# Patient Record
Sex: Female | Born: 1990 | Race: Black or African American | Hispanic: No | Marital: Single | Smoking: Former smoker
Health system: Southern US, Community
[De-identification: ages and names within clinical notes are randomized; demographics above are authoritative.]

## PROBLEM LIST (undated history)

## (undated) ENCOUNTER — Inpatient Hospital Stay (HOSPITAL_COMMUNITY): Payer: Self-pay

## (undated) DIAGNOSIS — M859 Disorder of bone density and structure, unspecified: Secondary | ICD-10-CM

## (undated) DIAGNOSIS — Z789 Other specified health status: Secondary | ICD-10-CM

## (undated) HISTORY — PX: NO PAST SURGERIES: SHX2092

---
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
## 2014-11-24 ENCOUNTER — Encounter (HOSPITAL_COMMUNITY): Payer: Self-pay | Admitting: *Deleted

## 2014-11-24 ENCOUNTER — Emergency Department (HOSPITAL_COMMUNITY)
Admission: EM | Admit: 2014-11-24 | Discharge: 2014-11-24 | Disposition: A | Discharge status: Home / Self Care | Payer: Self-pay | Attending: Emergency Medicine | Admitting: Emergency Medicine

## 2014-11-24 DIAGNOSIS — Z880 Allergy status to penicillin: Secondary | ICD-10-CM | POA: Insufficient documentation

## 2014-11-24 DIAGNOSIS — R1010 Upper abdominal pain, unspecified: Secondary | ICD-10-CM

## 2014-11-24 DIAGNOSIS — Z720 Tobacco use: Secondary | ICD-10-CM | POA: Insufficient documentation

## 2014-11-24 DIAGNOSIS — R112 Nausea with vomiting, unspecified: Secondary | ICD-10-CM | POA: Insufficient documentation

## 2014-11-24 LAB — COMPREHENSIVE METABOLIC PANEL
ALBUMIN: 3.8 g/dL (ref 3.5–5.0)
ALK PHOS: 45 U/L (ref 38–126)
ALT: 15 U/L (ref 14–54)
AST: 18 U/L (ref 15–41)
Anion gap: 5 (ref 5–15)
BUN: 10 mg/dL (ref 6–20)
CALCIUM: 9.3 mg/dL (ref 8.9–10.3)
CHLORIDE: 106 mmol/L (ref 101–111)
CO2: 26 mmol/L (ref 22–32)
CREATININE: 0.63 mg/dL (ref 0.44–1.00)
GFR calc non Af Amer: >60 mL/min (ref >60)
GLUCOSE: 92 mg/dL (ref 65–99)
Potassium: 4 mmol/L (ref 3.5–5.1)
SODIUM: 137 mmol/L (ref 135–145)
Total Bilirubin: 0.5 mg/dL (ref 0.3–1.2)
Total Protein: 7.7 g/dL (ref 6.5–8.1)

## 2014-11-24 LAB — CBC
HCT: 34.1 % — ABNORMAL LOW (ref 36.0–46.0)
Hemoglobin: 11.2 g/dL — ABNORMAL LOW (ref 12.0–15.0)
MCH: 22.3 pg — AB (ref 26.0–34.0)
MCHC: 32.8 g/dL (ref 30.0–36.0)
MCV: 67.8 fL — AB (ref 78.0–100.0)
PLATELETS: 274 10*3/uL (ref 150–400)
RBC: 5.03 MIL/uL (ref 3.87–5.11)
RDW: 14.9 % (ref 11.5–15.5)
WBC: 5.3 10*3/uL (ref 4.0–10.5)

## 2014-11-24 LAB — URINALYSIS, ROUTINE W REFLEX MICROSCOPIC
Bilirubin Urine: NEGATIVE
GLUCOSE, UA: NEGATIVE mg/dL
HGB URINE DIPSTICK: NEGATIVE
KETONES UR: NEGATIVE mg/dL
LEUKOCYTES UA: NEGATIVE
Nitrite: NEGATIVE
PH: 7.5 (ref 5.0–8.0)
Protein, ur: NEGATIVE mg/dL
Specific Gravity, Urine: 1.029 (ref 1.005–1.030)
Urobilinogen, UA: 1 mg/dL (ref 0.0–1.0)

## 2014-11-24 LAB — LIPASE, BLOOD: LIPASE: 18 U/L — AB (ref 22–51)

## 2014-11-24 LAB — HCG, QUANTITATIVE, PREGNANCY: hCG, Beta Chain, Quant, S: <1 m[IU]/mL (ref <5)

## 2014-11-24 MED ORDER — GI COCKTAIL ~~LOC~~
30.0000 mL | Freq: Once | ORAL | Status: AC
  Administered 2014-11-24: 30 mL via ORAL
  Filled 2014-11-24: qty 30

## 2014-11-24 MED ORDER — PANTOPRAZOLE SODIUM 40 MG PO TBEC
40.0000 mg | DELAYED_RELEASE_TABLET | Freq: Once | ORAL | Status: AC
  Administered 2014-11-24: 40 mg via ORAL
  Filled 2014-11-24: qty 1

## 2014-11-24 MED ORDER — PANTOPRAZOLE SODIUM 40 MG PO TBEC: 40.0000 mg | DELAYED_RELEASE_TABLET | Freq: Every day | ORAL | Status: DC

## 2014-11-24 NOTE — ED Notes (Signed)
Pt c/o lower abdominal and lower back pain for several weeks and emesis since yesterday.  Pt is not sure if she is pregnant.  Did not take pregnancy test.

## 2014-11-24 NOTE — ED Provider Notes (Signed)
CSN: [id number]     Arrival date & time 11/24/14  1336 History   First MD Initiated Contact with Patient 11/24/14 1503     Chief Complaint  Patient presents with  . Abdominal Pain  . Emesis     (Consider location/radiation/quality/duration/timing/severity/associated sxs/prior Treatment) HPI  Patient presents with concern of abdominal pain, back pain, nausea Symptoms began about 3 weeks ago, since onset have been intermittent, occurring without clear precipitant. Pain is typically supraumbilical, nonradiating, sore. Today the patient had one episode of vomiting as well. No ongoing fever, chills, substantial nausea, lower abdominal pain, diarrhea, urinary complaints. Patient states that she is generally well, takes no medication regularly.   History reviewed. No pertinent past medical history. History reviewed. No pertinent past surgical history. No family history on file. Social History  Substance Use Topics  . Smoking status: Current Some Day Smoker  . Smokeless tobacco: None  . Alcohol Use: Yes   OB History    No data available     Review of Systems  Constitutional:       Per HPI, otherwise negative  HENT:       Per HPI, otherwise negative  Respiratory:       Per HPI, otherwise negative  Cardiovascular:       Per HPI, otherwise negative  Gastrointestinal: Positive for nausea and vomiting.  Endocrine:       Negative aside from HPI  Genitourinary:       Neg aside from HPI   Musculoskeletal:       Per HPI, otherwise negative  Skin: Negative.   Neurological: Negative for syncope.      Allergies  Penicillins  Home Medications   Prior to Admission medications   Not on File   BP 112/68 mmHg  Pulse 78  Temp(Src) 98.4 F (36.9 C) (Oral)  Resp 18  Ht  (1.651 m)  Wt 162 lb (73.483 kg)  BMI 26.96 kg/m2  SpO2 100%  LMP 10/24/2014 Physical Exam  Constitutional: She is oriented to person, place, and time. She appears well-developed and well-nourished.  No distress.  HENT:  Head: Normocephalic and atraumatic.  Eyes: Conjunctivae and EOM are normal.  Cardiovascular: Normal rate and regular rhythm.   Pulmonary/Chest: Effort normal and breath sounds normal. No stridor. No respiratory distress.  Abdominal: She exhibits no distension. There is no hepatosplenomegaly. There is no tenderness. There is no rigidity, no guarding, no CVA tenderness, no tenderness at McBurney's point and negative Murphy's sign.  Musculoskeletal: She exhibits no edema.  Neurological: She is alert and oriented to person, place, and time. No cranial nerve deficit.  Skin: Skin is warm and dry.  Psychiatric: She has a normal mood and affect.  Nursing note and vitals reviewed.   ED Course  Procedures (including critical care time) Labs Review Labs Reviewed  LIPASE, BLOOD - Abnormal; Notable for the following:    Lipase 18 (*)    All other components within normal limits  CBC - Abnormal; Notable for the following:    Hemoglobin 11.2 (*)    HCT 34.1 (*)    MCV 67.8 (*)    MCH 22.3 (*)    All other components within normal limits  URINALYSIS, ROUTINE W REFLEX MICROSCOPIC (NOT AT [facility]) - Abnormal; Notable for the following:    APPearance HAZY (*)    All other components within normal limits  COMPREHENSIVE METABOLIC PANEL  HCG, QUANTITATIVE, PREGNANCY   On repeat exam the patient is in no distress. Labs  reassuring. We discussed therapy for gastritis, primary care/GI follow-up.  MDM  Well-appearing female presents with ongoing episodic abdominal pain. Here, no evidence for vascular compromise, infection, pregnancy. Patient is a soft, non-peritoneal, nontender abdomen. Patient was started on empiric therapy for presumed gastritis, discharged in stable condition with return precautions, follow-up instructions.  Gerhard Munch, MD 11/24/14 [phone]

## 2014-11-24 NOTE — Discharge Instructions (Signed)
As discussed, your evaluation today has been largely reassuring.  But, it is important that you monitor your condition carefully, and do not hesitate to return to the ED if you develop new, or concerning changes in your condition.  Please follow-up with our gastroenterologist physicians for appropriate ongoing care.   Abdominal Pain Many things can cause abdominal pain. Usually, abdominal pain is not caused by a disease and will improve without treatment. It can often be observed and treated at home. Your health care provider will do a physical exam and possibly order blood tests and X-rays to help determine the seriousness of your pain. However, in many cases, more time must pass before a clear cause of the pain can be found. Before that point, your health care provider may not know if you need more testing or further treatment. HOME CARE INSTRUCTIONS  Monitor your abdominal pain for any changes. The following actions may help to alleviate any discomfort you are experiencing:  Only take over-the-counter or prescription medicines as directed by your health care provider.  Do not take laxatives unless directed to do so by your health care provider.  Try a clear liquid diet (broth, tea, or water) as directed by your health care provider. Slowly move to a bland diet as tolerated. SEEK MEDICAL CARE IF:  You have unexplained abdominal pain.  You have abdominal pain associated with nausea or diarrhea.  You have pain when you urinate or have a bowel movement.  You experience abdominal pain that wakes you in the night.  You have abdominal pain that is worsened or improved by eating food.  You have abdominal pain that is worsened with eating fatty foods.  You have a fever. SEEK IMMEDIATE MEDICAL CARE IF:   Your pain does not go away within 2 hours.  You keep throwing up (vomiting).  Your pain is felt only in portions of the abdomen, such as the right side or the left lower portion of the  abdomen.  You pass bloody or black tarry stools. MAKE SURE YOU:  Understand these instructions.   Will watch your condition.   Will get help right away if you are not doing well or get worse.  Document Released: 12/19/2004 Document Revised: 03/16/2013 Document Reviewed: 11/18/2012 [facility] Patient Information 2015 [place], [place]. This information is not intended to replace advice given to you by your health care provider. Make sure you discuss any questions you have with your health care provider.

## 2015-03-26 NOTE — L&D Delivery Note (Signed)
25 y.o. G2P1001 at 7679w1d delivered a viable female infant in cephalic, OA position.  No nuchal cord. Anterior shoulder delivered with ease. 60 sec delayed cord clamping. Cord clamped x2 and cut. Placenta delivered spontaneously intact, with 3VC. Fundus firm on exam with massage and pitocin. Good hemostasis noted.  Laceration: 2nd degree perineal Suture: 3.0 Vicryl Good hemostasis noted. EBL: 100 cc  Mom and baby recovering in LDR.    Apgars:9,9 Weight:pending  Skin to skin, couplet care.    Freddrick MarchYashika Amin, MD PGY-1 03/20/2016, 10:19 AM   Midwife attestation: I was gloved and present for delivery in its entirety and I agree with the above resident's note.  Sharen CounterLisa Leftwich-Kirby, CNM 8:42 AM

## 2015-04-16 ENCOUNTER — Emergency Department (INDEPENDENT_AMBULATORY_CARE_PROVIDER_SITE_OTHER)
Admission: EM | Admit: 2015-04-16 | Discharge: 2015-04-16 | Disposition: A | Discharge status: Home / Self Care | Payer: Self-pay | Source: Home / Self Care | Attending: Family Medicine | Admitting: Family Medicine

## 2015-04-16 ENCOUNTER — Encounter (HOSPITAL_COMMUNITY): Payer: Self-pay | Admitting: Emergency Medicine

## 2015-04-16 DIAGNOSIS — B081 Molluscum contagiosum: Secondary | ICD-10-CM

## 2015-04-16 NOTE — Discharge Instructions (Signed)
Molluscum Contagiosum, Adult  Molluscum contagiosum is a skin infection that can cause a rash. When molluscum contagiosum affects the genital area, it is called a sexually transmitted disease (STD).  CAUSES  Molluscum contagiosum is caused by a virus. The virus can spread easily from person to person through:  · Skin-to-skin contact with an infected person.  · Contact with an infected object, such as a towel or clothing.  RISK FACTORS  You may be at higher risk for molluscum contagiosum if you:  · Live in an area where the weather is moist and warm.  · Have a weak body defense system (immune system).  SIGNS AND SYMPTOMS  The main symptom is a rash that appears 2-7 weeks after exposure to the virus. It is made up of 2-20 small, firm, dome-shaped bumps that may:  · Be pink or flesh-colored.  · Appear alone or in groups.  · Range from the size of a pinhead to the size of a pencil eraser.  · Feel smooth and waxy.  · Have a pit in the middle.  · Itch. The rash does not itch for most people.  The bumps often appears on the genitals, thighs, face, neck, and abdomen.  DIAGNOSIS   A health care provider can usually diagnose molluscum contagiosum by looking at the bumps on your skin. To confirm the diagnosis, your health care provider may scrape the bumps to collect a skin sample to examine under a microscope.  TREATMENT  The bumps may go away on their own, but people often have treatment to keep the virus from infecting someone else or to keep the rash from spreading to other body parts. Treatment may include:  · Surgery to remove the bumps by freezing them (cryosurgery).  · A procedure to scrape off the bumps (curettage).  · A procedure to remove the bumps with a laser.  · Putting medicine on the bumps (topical treatment).  Sometimes no treatment is needed.   HOME CARE INSTRUCTIONS  · Take medicines only as directed by your health care provider.  · As long as you have bumps on your skin, the infection can spread to others  and to other parts of your body. To prevent this from happening:    Do not scratch the bumps.    Do not share clothing or towels with others until the bumps disappear.      Avoid close contact with others until the bumps disappear. This includes sexual contact.     Wash your hands often.    Cover the bumps with clothing or a bandage when you will be near other people.  SEEK MEDICAL CARE IF:  · The bumps are spreading.  · The bumps are becoming red and sore.  · The bumps have not gone away after 12 months.     This information is not intended to replace advice given to you by your health care provider. Make sure you discuss any questions you have with your health care provider.     Document Released: 10/06/2013 Document Reviewed: 10/06/2013  Elsevier Interactive Patient Education ©2016 [employer]

## 2015-04-16 NOTE — ED Notes (Signed)
Pt here for possible scabies infection Started x 1 week ago to left palm area with spreading to the entire hand, itchy  No treatment tried

## 2015-04-16 NOTE — ED Provider Notes (Signed)
CSN: [id number]     Arrival date & time 04/16/15  1358 History   First MD Initiated Contact with Patient 04/16/15 1531     Chief Complaint  Patient presents with  . Rash   (Consider location/radiation/quality/duration/timing/severity/associated sxs/prior Treatment) HPI Rash on left hand since January 10. Initially went away for a few days then came back a couple of days ago. Very itchy, no home treatment.  History reviewed. No pertinent past medical history. History reviewed. No pertinent past surgical history. No family history on file. Social History  Substance Use Topics  . Smoking status: Current Some Day Smoker  . Smokeless tobacco: None  . Alcohol Use: Yes   OB History    No data available     Review of Systems ROS +'ve rash on left hand  Denies: HEADACHE, NAUSEA, ABDOMINAL PAIN, CHEST PAIN, CONGESTION, DYSURIA, SHORTNESS OF BREATH  Allergies  Penicillins  Home Medications   Prior to Admission medications   Medication Sig Start Date End Date Taking? Authorizing Provider  pantoprazole (PROTONIX) 40 MG tablet Take 1 tablet (40 mg total) by mouth daily. 11/24/14   Gerhard Munch, MD   Meds Ordered and Administered this Visit  Medications - No data to display  BP 112/68 mmHg  Pulse 77  Temp(Src) 98.1 F (36.7 C) (Oral)  SpO2 100%  LMP 03/31/2015 No data found.   Physical Exam  Constitutional: She appears well-developed and well-nourished.  Skin: Rash noted. Rash is papular.     Nursing note and vitals reviewed.   ED Course  Procedures (including critical care time)  Labs Review Labs Reviewed - No data to display  Imaging Review No results found.   Visual Acuity Review  Right Eye Distance:   Left Eye Distance:   Bilateral Distance:    Right Eye Near:   Left Eye Near:    Bilateral Near:         MDM   1. Molluscum contagiosum infection     symptomatic treatment Benadryl as needed May take a few weeks for this to resolve.      Tharon Aquas, PA 04/16/15 [phone]

## 2015-07-26 ENCOUNTER — Encounter: Payer: Self-pay | Admitting: Certified Nurse Midwife

## 2015-07-26 ENCOUNTER — Ambulatory Visit (INDEPENDENT_AMBULATORY_CARE_PROVIDER_SITE_OTHER): Payer: Medicaid Other | Admitting: Certified Nurse Midwife

## 2015-07-26 VITALS — BP 107/73 | HR 79 | Wt 153.0 lb

## 2015-07-26 DIAGNOSIS — Z3481 Encounter for supervision of other normal pregnancy, first trimester: Secondary | ICD-10-CM

## 2015-07-26 DIAGNOSIS — Z01419 Encounter for gynecological examination (general) (routine) without abnormal findings: Secondary | ICD-10-CM

## 2015-07-26 DIAGNOSIS — N926 Irregular menstruation, unspecified: Secondary | ICD-10-CM

## 2015-07-26 DIAGNOSIS — Z3201 Encounter for pregnancy test, result positive: Secondary | ICD-10-CM

## 2015-07-26 DIAGNOSIS — Z3049 Encounter for surveillance of other contraceptives: Secondary | ICD-10-CM

## 2015-07-26 DIAGNOSIS — O0990 Supervision of high risk pregnancy, unspecified, unspecified trimester: Secondary | ICD-10-CM | POA: Insufficient documentation

## 2015-07-26 NOTE — Progress Notes (Addendum)
Patient ID: Katelyn Bauer, female   DOB: 03-03-1991, 25 y.o.   MRN: [id number]    Subjective:      Katelyn Bauer is a 25 y.o. female here for a routine exam.  Current complaints:  Missed period, + UPT in office.  Denies any chronic medical conditions.      Personal health questionnaire:  Is patient Ashkenazi Jewish, have a family history of breast and/or ovarian cancer: no Is there a family history of uterine cancer diagnosed at age < 68, gastrointestinal cancer, urinary tract cancer, family member who is a Personnel officer syndrome-associated carrier: no Is the patient overweight and hypertensive, family history of diabetes, personal history of gestational diabetes, preeclampsia or PCOS: no Is patient over 21, have PCOS,  family history of premature CHD under age 52, diabetes, smoke, have hypertension or peripheral artery disease:  no At any time, has a partner hit, kicked or otherwise hurt or frightened you?: no Over the past 2 weeks, have you felt down, depressed or hopeless?: no Over the past 2 weeks, have you felt little interest or pleasure in doing things?:no   Gynecologic History Patient's last menstrual period was 06/20/2015. Exact date Contraception: none Last Pap: unknown. Results were: normal according to the patient Last mammogram: N/A.   Obstetric History OB History  Gravida Para Term Preterm AB SAB TAB Ectopic Multiple Living  # Outcome Date GA Lbr Len/2nd Weight Sex Delivery Anes PTL Lv  1 Term             Obstetric Comments  Delivered in [place].    G2P1, full term  No past medical history on file.  No past surgical history on file.  No current outpatient prescriptions on file. Allergies  Allergen Reactions  . Penicillins     Social History  Substance Use Topics  . Smoking status: Current Every Day Smoker  . Smokeless tobacco: Never Used     Comment: Positive Pregnancy Test Plans to Quit Today 07/26/15  . Alcohol Use: 0.0 oz/week    0 Standard  drinks or equivalent per week     Comment: Occasional    Family History  Problem Relation Age of Onset  . Lung cancer Paternal Grandmother       Review of Systems  Constitutional: negative for fatigue and weight loss Respiratory: negative for cough and wheezing Cardiovascular: negative for chest pain, fatigue and palpitations Gastrointestinal: negative for abdominal pain and change in bowel habits Musculoskeletal:negative for myalgias Neurological: negative for gait problems and tremors Behavioral/Psych: negative for abusive relationship, depression Endocrine: negative for temperature intolerance   Genitourinary:negative for abnormal menstrual periods, genital lesions, hot flashes, sexual problems and vaginal discharge Integument/breast: negative for breast lump, breast tenderness, nipple discharge and skin lesion(s)    Objective:       BP 107/73 mmHg  Pulse 79  Wt 153 lb (69.4 kg)  LMP 06/20/2015 General:   alert  Skin:   no rash or abnormalities  Lungs:   clear to auscultation bilaterally  Heart:   regular rate and rhythm, S1, S2 normal, no murmur, click, rub or gallop  Breasts:   normal without suspicious masses, skin or nipple changes or axillary nodes  Abdomen:  normal findings: no organomegaly, soft, non-tender and no hernia  Pelvis:  External genitalia: normal general appearance Urinary system: urethral meatus normal and bladder without fullness, nontender Vaginal: normal without tenderness, induration or masses Cervix: normal appearance, bleeding with  pap smear/friable cervix Adnexa: normal bimanual exam Uterus: anteverted and non-tender, normal size   Lab Review Urine pregnancy test Labs reviewed yes Radiologic studies reviewed no  50% of 30 min visit spent on counseling and coordination of care.   Assessment:    Healthy female exam.   +UPT  Friable cervix Plan:    Education reviewed: calcium supplements, depression evaluation, low fat, low  cholesterol diet and self breast exams. Contraception: none. Follow up in: 3 weeks for NOB appointment.   No orders of the defined types were placed in this encounter.   Orders Placed This Encounter  Procedures  . Culture, OB Urine  . US OB Comp Less 14 Wks    Standing Status: Future     Number of Occurrences:      Standing Expiration Date: 09/24/2016    Order Specific Question:  Reason for Exam (SYMPTOM  OR DIAGNOSIS REQUIRED)    Answer:  dating, viability     Comments:  2961w2d today roughly    Order Specific Question:  Preferred imaging location?    Answer:  Internal  . Prenatal Profile I  . HIV antibody  . Hemoglobinopathy evaluation  . Varicella zoster antibody, IgG  . TSH  . Beta HCG, Quant  . Iron Binding Cap (TIBC)  . Ferritin  . NuSwab Vaginitis Plus (VG+)  . POCT urinalysis dipstick   Need to obtain previous records

## 2015-07-28 ENCOUNTER — Other Ambulatory Visit: Payer: Self-pay | Admitting: Certified Nurse Midwife

## 2015-07-28 DIAGNOSIS — Z3490 Encounter for supervision of normal pregnancy, unspecified, unspecified trimester: Secondary | ICD-10-CM

## 2015-07-28 LAB — PRENATAL PROFILE I(LABCORP)
ANTIBODY SCREEN: NEGATIVE
Basophils Absolute: 0.1 10*3/uL (ref 0.0–0.2)
Basos: 1 %
EOS (ABSOLUTE): 0.1 10*3/uL (ref 0.0–0.4)
EOS: 1 %
Hematocrit: 34.8 % (ref 34.0–46.6)
Hemoglobin: 11.4 g/dL (ref 11.1–15.9)
Hepatitis B Surface Ag: NEGATIVE
IMMATURE GRANS (ABS): 0 10*3/uL (ref 0.0–0.1)
IMMATURE GRANULOCYTES: 0 %
LYMPHS: 27 %
Lymphocytes Absolute: 1.7 10*3/uL (ref 0.7–3.1)
MCH: 21.9 pg — ABNORMAL LOW (ref 26.6–33.0)
MCHC: 32.8 g/dL (ref 31.5–35.7)
MCV: 67 fL — AB (ref 79–97)
Monocytes Absolute: 0.5 10*3/uL (ref 0.1–0.9)
Monocytes: 7 %
NEUTROS PCT: 64 %
Neutrophils Absolute: 4.1 10*3/uL (ref 1.4–7.0)
Platelets: 321 10*3/uL (ref 150–379)
RBC: 5.21 x10E6/uL (ref 3.77–5.28)
RDW: 16.2 % — ABNORMAL HIGH (ref 12.3–15.4)
RH TYPE: POSITIVE
RPR Ser Ql: NONREACTIVE
RUBELLA: 7.51 {index} (ref >0.99)
WBC: 6.4 10*3/uL (ref 3.4–10.8)

## 2015-07-28 LAB — HEMOGLOBINOPATHY EVALUATION
HEMOGLOBIN A2 QUANTITATION: 2.5 % (ref 0.7–3.1)
HGB A: 97.5 % (ref 94.0–98.0)
HGB C: 0 %
HGB S: 0 %
Hemoglobin F Quantitation: 0 % (ref 0.0–2.0)

## 2015-07-28 LAB — BETA HCG QUANT (REF LAB): HCG QUANT: 4182 m[IU]/mL

## 2015-07-28 LAB — IRON AND TIBC
IRON SATURATION: 20 % (ref 15–55)
IRON: 83 ug/dL (ref 27–159)
Total Iron Binding Capacity: 419 ug/dL (ref 250–450)
UIBC: 336 ug/dL (ref 131–425)

## 2015-07-28 LAB — VARICELLA ZOSTER ANTIBODY, IGG: Varicella zoster IgG: 985 index (ref >165)

## 2015-07-28 LAB — URINE CULTURE, OB REFLEX

## 2015-07-28 LAB — FERRITIN: Ferritin: 19 ng/mL (ref 15–150)

## 2015-07-28 LAB — CULTURE, OB URINE

## 2015-07-28 LAB — TSH: TSH: 1.08 u[IU]/mL (ref 0.450–4.500)

## 2015-07-28 LAB — HIV ANTIBODY (ROUTINE TESTING W REFLEX): HIV Screen 4th Generation wRfx: NONREACTIVE

## 2015-07-28 MED ORDER — VITAFOL GUMMIES 3.33-0.333-34.8 MG PO CHEW: 3.0000 | CHEWABLE_TABLET | Freq: Every day | ORAL | Status: DC

## 2015-08-01 ENCOUNTER — Encounter: Payer: Self-pay | Admitting: Certified Nurse Midwife

## 2015-08-02 ENCOUNTER — Encounter: Payer: Self-pay | Admitting: *Deleted

## 2015-08-02 ENCOUNTER — Other Ambulatory Visit: Payer: Self-pay | Admitting: Certified Nurse Midwife

## 2015-08-02 ENCOUNTER — Encounter (HOSPITAL_COMMUNITY): Payer: Self-pay | Admitting: Nurse Practitioner

## 2015-08-02 ENCOUNTER — Emergency Department (HOSPITAL_COMMUNITY)
Admission: EM | Admit: 2015-08-02 | Discharge: 2015-08-02 | Disposition: A | Discharge status: Home / Self Care | Payer: Medicaid Other | Attending: Emergency Medicine | Admitting: Emergency Medicine

## 2015-08-02 DIAGNOSIS — Z3A01 Less than 8 weeks gestation of pregnancy: Secondary | ICD-10-CM | POA: Insufficient documentation

## 2015-08-02 DIAGNOSIS — F17200 Nicotine dependence, unspecified, uncomplicated: Secondary | ICD-10-CM | POA: Insufficient documentation

## 2015-08-02 DIAGNOSIS — O9989 Other specified diseases and conditions complicating pregnancy, childbirth and the puerperium: Secondary | ICD-10-CM | POA: Diagnosis not present

## 2015-08-02 DIAGNOSIS — O99331 Smoking (tobacco) complicating pregnancy, first trimester: Secondary | ICD-10-CM | POA: Insufficient documentation

## 2015-08-02 DIAGNOSIS — R109 Unspecified abdominal pain: Secondary | ICD-10-CM | POA: Insufficient documentation

## 2015-08-02 DIAGNOSIS — N760 Acute vaginitis: Principal | ICD-10-CM

## 2015-08-02 DIAGNOSIS — B9689 Other specified bacterial agents as the cause of diseases classified elsewhere: Secondary | ICD-10-CM

## 2015-08-02 LAB — CBC
HCT: 32.8 % — ABNORMAL LOW (ref 36.0–46.0)
Hemoglobin: 11 g/dL — ABNORMAL LOW (ref 12.0–15.0)
MCH: 22.5 pg — ABNORMAL LOW (ref 26.0–34.0)
MCHC: 33.5 g/dL (ref 30.0–36.0)
MCV: 67.2 fL — ABNORMAL LOW (ref 78.0–100.0)
PLATELETS: 270 10*3/uL (ref 150–400)
RBC: 4.88 MIL/uL (ref 3.87–5.11)
RDW: 15.5 % (ref 11.5–15.5)
WBC: 5.4 10*3/uL (ref 4.0–10.5)

## 2015-08-02 LAB — PAP IG W/ RFLX HPV ASCU: PAP SMEAR COMMENT: 0

## 2015-08-02 LAB — NUSWAB VAGINITIS PLUS (VG+)
Atopobium vaginae: HIGH Score — AB
BVAB 2: HIGH {score} — AB
Candida albicans, NAA: NEGATIVE
Candida glabrata, NAA: NEGATIVE
Chlamydia trachomatis, NAA: NEGATIVE
MEGASPHAERA 1: HIGH {score} — AB
Neisseria gonorrhoeae, NAA: NEGATIVE
TRICH VAG BY NAA: NEGATIVE

## 2015-08-02 LAB — COMPREHENSIVE METABOLIC PANEL
ALBUMIN: 3.6 g/dL (ref 3.5–5.0)
ALK PHOS: 27 U/L — AB (ref 38–126)
ALT: 10 U/L — AB (ref 14–54)
ANION GAP: 8 (ref 5–15)
AST: 15 U/L (ref 15–41)
BILIRUBIN TOTAL: 0.4 mg/dL (ref 0.3–1.2)
BUN: 8 mg/dL (ref 6–20)
CALCIUM: 9.4 mg/dL (ref 8.9–10.3)
CO2: 21 mmol/L — ABNORMAL LOW (ref 22–32)
CREATININE: 0.48 mg/dL (ref 0.44–1.00)
Chloride: 106 mmol/L (ref 101–111)
GFR calc Af Amer: >60 mL/min (ref >60)
GFR calc non Af Amer: >60 mL/min (ref >60)
GLUCOSE: 97 mg/dL (ref 65–99)
Potassium: 4 mmol/L (ref 3.5–5.1)
Sodium: 135 mmol/L (ref 135–145)
TOTAL PROTEIN: 7.3 g/dL (ref 6.5–8.1)

## 2015-08-02 LAB — I-STAT BETA HCG BLOOD, ED (MC, WL, AP ONLY)

## 2015-08-02 MED ORDER — METRONIDAZOLE 0.75 % VA GEL: 1.0000 | Freq: Two times a day (BID) | VAGINAL | Status: DC

## 2015-08-02 NOTE — ED Notes (Signed)
Pt returned arm band to nurse first. NAD at this time.

## 2015-08-02 NOTE — ED Notes (Signed)
Pt c/o 3 day history of abd pain and fatigue. She is [redacted] weeks pregnant. Reports she did not have this pain with her past pregnancy and describes pain as moderate to severe intermittent cramping in her lower abd and back. She denies any vaginal discharge or bleeding. She does have nausea but denies vomiting. She is alert and breathing easily

## 2015-08-19 ENCOUNTER — Encounter (HOSPITAL_COMMUNITY): Payer: Self-pay

## 2015-08-19 ENCOUNTER — Emergency Department (HOSPITAL_COMMUNITY)
Admission: EM | Admit: 2015-08-19 | Discharge: 2015-08-19 | Disposition: A | Discharge status: Home / Self Care | Payer: Medicaid Other | Attending: Emergency Medicine | Admitting: Emergency Medicine

## 2015-08-19 DIAGNOSIS — R109 Unspecified abdominal pain: Secondary | ICD-10-CM

## 2015-08-19 DIAGNOSIS — R1030 Lower abdominal pain, unspecified: Secondary | ICD-10-CM | POA: Diagnosis not present

## 2015-08-19 DIAGNOSIS — Z79899 Other long term (current) drug therapy: Secondary | ICD-10-CM | POA: Insufficient documentation

## 2015-08-19 DIAGNOSIS — Z3A08 8 weeks gestation of pregnancy: Secondary | ICD-10-CM | POA: Diagnosis not present

## 2015-08-19 DIAGNOSIS — O210 Mild hyperemesis gravidarum: Secondary | ICD-10-CM | POA: Diagnosis not present

## 2015-08-19 DIAGNOSIS — O9989 Other specified diseases and conditions complicating pregnancy, childbirth and the puerperium: Secondary | ICD-10-CM | POA: Insufficient documentation

## 2015-08-19 DIAGNOSIS — F17200 Nicotine dependence, unspecified, uncomplicated: Secondary | ICD-10-CM | POA: Diagnosis not present

## 2015-08-19 DIAGNOSIS — O26899 Other specified pregnancy related conditions, unspecified trimester: Secondary | ICD-10-CM

## 2015-08-19 DIAGNOSIS — Z792 Long term (current) use of antibiotics: Secondary | ICD-10-CM | POA: Diagnosis not present

## 2015-08-19 DIAGNOSIS — Z880 Allergy status to penicillin: Secondary | ICD-10-CM | POA: Diagnosis not present

## 2015-08-19 DIAGNOSIS — O99331 Smoking (tobacco) complicating pregnancy, first trimester: Secondary | ICD-10-CM | POA: Diagnosis not present

## 2015-08-19 LAB — CBC
HEMATOCRIT: 32.5 % — AB (ref 36.0–46.0)
HEMOGLOBIN: 10.6 g/dL — AB (ref 12.0–15.0)
MCH: 21.7 pg — ABNORMAL LOW (ref 26.0–34.0)
MCHC: 32.6 g/dL (ref 30.0–36.0)
MCV: 66.6 fL — ABNORMAL LOW (ref 78.0–100.0)
Platelets: 252 10*3/uL (ref 150–400)
RBC: 4.88 MIL/uL (ref 3.87–5.11)
RDW: 15.3 % (ref 11.5–15.5)
WBC: 6.8 10*3/uL (ref 4.0–10.5)

## 2015-08-19 LAB — COMPREHENSIVE METABOLIC PANEL
ALBUMIN: 3.5 g/dL (ref 3.5–5.0)
ALT: 9 U/L — ABNORMAL LOW (ref 14–54)
ANION GAP: 7 (ref 5–15)
AST: 13 U/L — ABNORMAL LOW (ref 15–41)
Alkaline Phosphatase: 24 U/L — ABNORMAL LOW (ref 38–126)
BILIRUBIN TOTAL: 0.5 mg/dL (ref 0.3–1.2)
BUN: 6 mg/dL (ref 6–20)
CO2: 22 mmol/L (ref 22–32)
Calcium: 9.3 mg/dL (ref 8.9–10.3)
Chloride: 104 mmol/L (ref 101–111)
Creatinine, Ser: 0.5 mg/dL (ref 0.44–1.00)
GFR calc non Af Amer: >60 mL/min (ref >60)
GLUCOSE: 95 mg/dL (ref 65–99)
POTASSIUM: 3.7 mmol/L (ref 3.5–5.1)
SODIUM: 133 mmol/L — AB (ref 135–145)
TOTAL PROTEIN: 7 g/dL (ref 6.5–8.1)

## 2015-08-19 LAB — WET PREP, GENITAL
CLUE CELLS WET PREP: NONE SEEN
Sperm: NONE SEEN
TRICH WET PREP: NONE SEEN
YEAST WET PREP: NONE SEEN

## 2015-08-19 LAB — URINALYSIS, ROUTINE W REFLEX MICROSCOPIC
BILIRUBIN URINE: NEGATIVE
Glucose, UA: NEGATIVE mg/dL
Hgb urine dipstick: NEGATIVE
Ketones, ur: NEGATIVE mg/dL
NITRITE: NEGATIVE
PH: 8 (ref 5.0–8.0)
Protein, ur: NEGATIVE mg/dL
SPECIFIC GRAVITY, URINE: 1.026 (ref 1.005–1.030)

## 2015-08-19 LAB — HCG, QUANTITATIVE, PREGNANCY: HCG, BETA CHAIN, QUANT, S: 118812 m[IU]/mL — AB (ref <5)

## 2015-08-19 LAB — URINE MICROSCOPIC-ADD ON
BACTERIA UA: NONE SEEN
RBC / HPF: NONE SEEN RBC/hpf (ref 0–5)

## 2015-08-19 LAB — LIPASE, BLOOD: Lipase: 20 U/L (ref 11–51)

## 2015-08-19 LAB — I-STAT BETA HCG BLOOD, ED (MC, WL, AP ONLY)

## 2015-08-19 NOTE — ED Notes (Signed)
Patient reports that she is [redacted] weeks pregnant and has abdominal pain with vomiting x 2 weeks. Has not had 1st prenatal visit

## 2015-08-19 NOTE — Discharge Instructions (Signed)
Please follow with your primary care doctor in the next 2 days for a check-up. They must obtain records for further management.  ° °Do not hesitate to return to the Emergency Department for any new, worsening or concerning symptoms.  ° °

## 2015-08-19 NOTE — ED Provider Notes (Signed)
CSN: [id number]     Arrival date & time 08/19/15  1107 History   First MD Initiated Contact with Patient 08/19/15 1119     Chief Complaint  Patient presents with  . Abdominal Pain  . Emesis During Pregnancy     (Consider location/radiation/quality/duration/timing/severity/associated sxs/prior Treatment) HPI   Blood pressure 110/65, pulse 78, temperature 98 F (36.7 C), temperature source Oral, resp. rate 14, last menstrual period 06/20/2015, SpO2 100 %.  [name] is a 25 y.o. female G2P1 last menstrual period was 06/20/2015 complaining of lower abdominal pain, bilateral sometimes radiating to the left, occasionally radiating to the right, onset 2 weeks ago. She had 4 positive home pregnancy tests and [facility] for a regular Pap smear exam. Her first prenatal visit is on June 1. She is taking prenatal vitamins however, states that she vomits them, she most vomits in the a.m. Approximately 1 time per day, it is nonbloody and nonbilious. She isn't taking any pain medication at home. She denies any abnormal vaginal discharge, dysuria, hematuria, urinary frequency.   History reviewed. No pertinent past medical history. History reviewed. No pertinent past surgical history. Family History  Problem Relation Age of Onset  . Lung cancer Paternal Grandmother    Social History  Substance Use Topics  . Smoking status: Current Every Day Smoker  . Smokeless tobacco: Never Used     Comment: Positive Pregnancy Test Plans to Quit Today 07/26/15  . Alcohol Use: 0.0 oz/week    0 Standard drinks or equivalent per week     Comment: Occasional   OB History    Gravida Para Term Preterm AB TAB SAB Ectopic Multiple Living   2 1 1       1       Obstetric Comments   [place] in [place]       Review of Systems  10 systems reviewed and found to be negative, except as noted in the HPI.   Allergies  Penicillins  Home Medications   Prior to Admission  medications   Medication Sig Start Date End Date Taking? Authorizing Provider  metroNIDAZOLE (METROGEL VAGINAL) 0.75 % vaginal gel Place 1 Applicatorful vaginally 2 (two) times daily. 08/02/15   Rachelle A Denney, CNM  Prenatal Vit-Fe Phos-FA-Omega (VITAFOL GUMMIES) 3.33-0.333-34.8 MG CHEW Chew 3 tablets by mouth daily. 07/28/15   Rachelle A Denney, CNM   BP 110/65 mmHg  Pulse 78  Temp(Src) 98 F (36.7 C) (Oral)  Resp 14  SpO2 100%  LMP 06/20/2015 Physical Exam  Constitutional: She is oriented to person, place, and time. She appears well-developed and well-nourished. No distress.  HENT:  Head: Normocephalic and atraumatic.  Mouth/Throat: Oropharynx is clear and moist.  Eyes: Conjunctivae and EOM are normal. Pupils are equal, round, and reactive to light.  Neck: Normal range of motion.  Cardiovascular: Normal rate, regular rhythm and intact distal pulses.   Pulmonary/Chest: Effort normal and breath sounds normal. No respiratory distress. She has no wheezes. She has no rales. She exhibits no tenderness.  Abdominal: Soft. Bowel sounds are normal. She exhibits no distension and no mass. There is no tenderness. There is no rebound and no guarding.  Genitourinary:  Exam is chaperoned by technician: No rashes or lesions, no abnormal vaginal discharge, no cervical or adnexal tenderness.  Musculoskeletal: Normal range of motion.  Neurological: She is alert and oriented to person, place, and time.  Skin: She is not diaphoretic.  Psychiatric: She has a normal mood and affect.  Nursing note and vitals reviewed.   ED Course  Procedures (including critical care time) Labs Review Labs Reviewed  WET PREP, GENITAL - Abnormal; Notable for the following:    WBC, Wet Prep HPF POC MANY (*)    All other components within normal limits  COMPREHENSIVE METABOLIC PANEL - Abnormal; Notable for the following:    Sodium 133 (*)    AST 13 (*)    ALT 9 (*)    Alkaline Phosphatase 24 (*)    All other  components within normal limits  CBC - Abnormal; Notable for the following:    Hemoglobin 10.6 (*)    HCT 32.5 (*)    MCV 66.6 (*)    MCH 21.7 (*)    All other components within normal limits  URINALYSIS, ROUTINE W REFLEX MICROSCOPIC (NOT AT [facility]) - Abnormal; Notable for the following:    APPearance TURBID (*)    Leukocytes, UA SMALL (*)    All other components within normal limits  HCG, QUANTITATIVE, PREGNANCY - Abnormal; Notable for the following:    hCG, Beta Chain, [place] Longest [id number] (*)    All other components within normal limits  URINE MICROSCOPIC-ADD ON - Abnormal; Notable for the following:    Squamous Epithelial / LPF 0-5 (*)    All other components within normal limits  I-STAT BETA HCG BLOOD, ED ([facility], [facility], [facility] ONLY) - Abnormal; Notable for the following:    I-stat hCG, quantitative >2000.0 (*)    All other components within normal limits  URINE CULTURE  LIPASE, BLOOD  GC/CHLAMYDIA PROBE AMP () NOT AT [facility]    Imaging Review No results found. I have personally reviewed and evaluated these images and lab results as part of my medical decision-making.   EKG Interpretation None      MDM   Final diagnoses:  Abdominal pain affecting pregnancy    Filed Vitals:   08/19/15 1114  BP: 110/65  Pulse: 78  Temp: 98 F (36.7 C)  TempSrc: Oral  Resp: 14  SpO2: 100%    Katelyn Bauer is 25 y.o. female presenting with Lower abdominal pain and morning sickness, she is approximately [redacted] weeks pregnant, has not had first prenatal visit. Abdominal exam is benign however, she states her pain is severe, pelvic exam without acute abnormality. Wet prep reassuring with a mild leukocytosis. Attending physician has performed bedside ultrasound with intrauterine pregnancy normal heart rate. Encouraged patient to follow closely with her OB/GYN at [facility].   Analysis highly contaminated, does not appear to be consistent with infection.  Evaluation does not show pathology that would  require ongoing emergent intervention or inpatient treatment. Pt is hemodynamically stable and mentating appropriately. Discussed findings and plan with patient/guardian, who agrees with care plan. All questions answered. Return precautions discussed and outpatient follow up given.        Wynetta Emery, PA-C 08/19/15 1602  Tilden Fossa, MD 08/20/15 [phone]

## 2015-08-19 NOTE — ED Notes (Signed)
Pt ambulatory to restroom with steady gait.

## 2015-08-20 LAB — URINE CULTURE: Culture: <10000 — AB

## 2015-08-22 LAB — GC/CHLAMYDIA PROBE AMP (~~LOC~~) NOT AT ARMC
CHLAMYDIA, DNA PROBE: NEGATIVE
Neisseria Gonorrhea: NEGATIVE

## 2015-08-23 ENCOUNTER — Encounter: Payer: Medicaid Other | Admitting: Certified Nurse Midwife

## 2015-08-24 ENCOUNTER — Encounter: Payer: Self-pay | Admitting: Certified Nurse Midwife

## 2015-08-24 ENCOUNTER — Ambulatory Visit (INDEPENDENT_AMBULATORY_CARE_PROVIDER_SITE_OTHER): Payer: Medicaid Other

## 2015-08-24 ENCOUNTER — Other Ambulatory Visit: Payer: Self-pay | Admitting: Certified Nurse Midwife

## 2015-08-24 ENCOUNTER — Ambulatory Visit (INDEPENDENT_AMBULATORY_CARE_PROVIDER_SITE_OTHER): Payer: Medicaid Other | Admitting: Certified Nurse Midwife

## 2015-08-24 VITALS — BP 105/61 | HR 77 | Wt 156.0 lb

## 2015-08-24 DIAGNOSIS — Z331 Pregnant state, incidental: Secondary | ICD-10-CM

## 2015-08-24 DIAGNOSIS — O09291 Supervision of pregnancy with other poor reproductive or obstetric history, first trimester: Secondary | ICD-10-CM

## 2015-08-24 DIAGNOSIS — O09299 Supervision of pregnancy with other poor reproductive or obstetric history, unspecified trimester: Secondary | ICD-10-CM | POA: Insufficient documentation

## 2015-08-24 DIAGNOSIS — O219 Vomiting of pregnancy, unspecified: Secondary | ICD-10-CM | POA: Diagnosis not present

## 2015-08-24 DIAGNOSIS — Z3481 Encounter for supervision of other normal pregnancy, first trimester: Secondary | ICD-10-CM

## 2015-08-24 DIAGNOSIS — Z3490 Encounter for supervision of normal pregnancy, unspecified, unspecified trimester: Secondary | ICD-10-CM

## 2015-08-24 DIAGNOSIS — Z1389 Encounter for screening for other disorder: Secondary | ICD-10-CM | POA: Diagnosis not present

## 2015-08-24 DIAGNOSIS — Z3201 Encounter for pregnancy test, result positive: Secondary | ICD-10-CM

## 2015-08-24 DIAGNOSIS — O3680X1 Pregnancy with inconclusive fetal viability, fetus 1: Secondary | ICD-10-CM

## 2015-08-24 DIAGNOSIS — Z8632 Personal history of gestational diabetes: Secondary | ICD-10-CM

## 2015-08-24 LAB — POCT URINALYSIS DIPSTICK
Bilirubin, UA: NEGATIVE
Blood, UA: NEGATIVE
GLUCOSE UA: NEGATIVE
Ketones, UA: NEGATIVE
NITRITE UA: NEGATIVE
Protein, UA: NEGATIVE
Spec Grav, UA: 1.01
UROBILINOGEN UA: NEGATIVE
pH, UA: 8

## 2015-08-24 MED ORDER — DOXYLAMINE-PYRIDOXINE 10-10 MG PO TBEC: DELAYED_RELEASE_TABLET | ORAL | Status: DC

## 2015-08-24 NOTE — Addendum Note (Signed)
Addended by: Marya LandryFOSTER, Tyerra Loretto D on: 08/24/2015 03:31 PM   Modules accepted: Orders

## 2015-08-24 NOTE — Progress Notes (Signed)
Subjective:    Katelyn BoschLeona Bauer is being seen today for her first obstetrical visit.  This is not a planned pregnancy. She is at Unknown gestation. Her obstetrical history is significant for GDM previous pregnancy. Relationship with FOB: significant other, living together. Patient does intend to breast feed. Pregnancy history fully reviewed.  The information documented in the HPI was reviewed and verified.  Menstrual History: OB History    Gravida Para Term Preterm AB TAB SAB Ectopic Multiple Living   2 1 1       2       Obstetric Comments   Delivered in [place]  History of Twins runs in the family. Patient is a twin And her father is a twin.      Menarche age: 114-25 years of age  Patient's last menstrual period was 06/20/2015 (lmp unknown).    No past medical history on file.  No past surgical history on file.   (Not in a hospital admission) Allergies  Allergen Reactions  . Penicillins Swelling    Social History  Substance Use Topics  . Smoking status: Current Every Day Smoker  . Smokeless tobacco: Never Used     Comment: Positive Pregnancy Test Plans to Quit Today 07/26/15  . Alcohol Use: 0.0 oz/week    0 Standard drinks or equivalent per week     Comment: Occasional    Family History  Problem Relation Age of Onset  . Lung cancer Paternal Grandmother      Review of Systems Constitutional: negative for weight loss Gastrointestinal: + for nausea & vomiting Genitourinary:negative for genital lesions and vaginal discharge and dysuria Musculoskeletal:negative for back pain Behavioral/Psych: negative for abusive relationship, depression, illegal drug usage and tobacco use    Objective:    BP 105/61 mmHg  Pulse 77  Wt 156 lb (70.761 kg)  LMP 06/20/2015 (LMP Unknown) General Appearance:    Alert, cooperative, no distress, appears stated age  Head:    Normocephalic, without obvious abnormality, atraumatic  Eyes:    PERRL, conjunctiva/corneas clear, EOM's intact, fundi     benign, both eyes  Ears:    Normal TM's and external ear canals, both ears  Nose:   Nares normal, septum midline, mucosa normal, no drainage    or sinus tenderness  Throat:   Lips, mucosa, and tongue normal; teeth and gums normal  Neck:   Supple, symmetrical, trachea midline, no adenopathy;    thyroid:  no enlargement/tenderness/nodules; no carotid   bruit or JVD  Back:     Symmetric, no curvature, ROM normal, no CVA tenderness  Lungs:     Clear to auscultation bilaterally, respirations unlabored  Chest Wall:    No tenderness or deformity   Heart:    Regular rate and rhythm, S1 and S2 normal, no murmur, rub   or gallop  Breast Exam:    No tenderness, masses, or nipple abnormality  Abdomen:     Soft, non-tender, bowel sounds active all four quadrants,    no masses, no organomegaly  Genitalia:    Normal female without lesion, discharge or tenderness  Extremities:   Extremities normal, atraumatic, no cyanosis or edema  Pulses:   2+ and symmetric all extremities  Skin:   Skin color, texture, turgor normal, no rashes or lesions  Lymph nodes:   Cervical, supraclavicular, and axillary nodes normal  Neurologic:   CNII-XII intact, normal strength, sensation and reflexes    throughout           Cervix:  Long,  thick, closed and posterior.      Lab Review Urine pregnancy test Labs reviewed yes Radiologic studies reviewed yes Assessment:    Pregnancy at roughly 9 weeks   H/O GDM  Plan:      Prenatal vitamins.  Counseling provided regarding continued use of seat belts, cessation of alcohol consumption, smoking or use of illicit drugs; infection precautions i.e., influenza/TDAP immunizations, toxoplasmosis,CMV, parvovirus, listeria and varicella; workplace safety, exercise during pregnancy; routine dental care, safe medications, sexual activity, hot tubs, saunas, pools, travel, caffeine use, fish and methlymercury, potential toxins, hair treatments, varicose veins Weight gain  recommendations per IOM guidelines reviewed: underweight/BMI< 18.5--> gain 28 - 40 lbs; normal weight/BMI 18.5 - 24.9--> gain 25 - 35 lbs; overweight/BMI 25 - 29.9--> gain 15 - 25 lbs; obese/BMI >30->gain  11 - 20 lbs Problem list reviewed and updated. FIRST/CF mutation testing/NIPT/QUAD SCREEN/fragile X/Ashkenazi Jewish population testing/Spinal muscular atrophy discussed: requested. Role of ultrasound in pregnancy discussed; fetal survey: requested. Amniocentesis discussed: not indicated. VBAC calculator score: VBAC consent form provided No orders of the defined types were placed in this encounter.   Orders Placed This Encounter  Procedures  . POCT urinalysis dipstick    Follow up in 4 weeks. 50% of 30 min visit spent on counseling and coordination of care.

## 2015-08-27 LAB — PAP IG W/ RFLX HPV ASCU: PAP Smear Comment: 0

## 2015-09-01 ENCOUNTER — Other Ambulatory Visit: Payer: Self-pay | Admitting: Certified Nurse Midwife

## 2015-09-01 LAB — MATERNIT21 PLUS CORE+SCA
CHROMOSOME 13: NEGATIVE
CHROMOSOME 18: NEGATIVE
CHROMOSOME 21: NEGATIVE
PDF: 0
Y Chromosome: NOT DETECTED

## 2015-09-01 LAB — HEMOGLOBIN A1C
Est. average glucose Bld gHb Est-mCnc: 108 mg/dL
Hgb A1c MFr Bld: 5.4 % (ref 4.8–5.6)

## 2015-09-21 ENCOUNTER — Ambulatory Visit (INDEPENDENT_AMBULATORY_CARE_PROVIDER_SITE_OTHER): Payer: Medicaid Other | Admitting: Certified Nurse Midwife

## 2015-09-21 ENCOUNTER — Other Ambulatory Visit: Payer: Medicaid Other

## 2015-09-21 VITALS — BP 104/63 | HR 81 | Temp 97.8°F | Wt 155.0 lb

## 2015-09-21 DIAGNOSIS — Z331 Pregnant state, incidental: Secondary | ICD-10-CM

## 2015-09-21 DIAGNOSIS — O219 Vomiting of pregnancy, unspecified: Secondary | ICD-10-CM | POA: Diagnosis not present

## 2015-09-21 DIAGNOSIS — Z1389 Encounter for screening for other disorder: Secondary | ICD-10-CM | POA: Diagnosis not present

## 2015-09-21 DIAGNOSIS — Z3482 Encounter for supervision of other normal pregnancy, second trimester: Secondary | ICD-10-CM | POA: Diagnosis not present

## 2015-09-21 LAB — POCT URINALYSIS DIPSTICK
BILIRUBIN UA: NEGATIVE
Blood, UA: NEGATIVE
GLUCOSE UA: NEGATIVE
Ketones, UA: NEGATIVE
NITRITE UA: NEGATIVE
Protein, UA: NEGATIVE
SPEC GRAV UA: 1.015
UROBILINOGEN UA: NEGATIVE
pH, UA: 7

## 2015-09-21 MED ORDER — ONDANSETRON HCL 4 MG PO TABS: 4.0000 mg | ORAL_TABLET | Freq: Every day | ORAL | Status: DC | PRN

## 2015-09-21 NOTE — Progress Notes (Signed)
  Subjective:    Katelyn Bauer is a 25 y.o. female being seen today for her obstetrical visit. She is at 4573w2d gestation. Patient reports: no complaints.  Problem List Items Addressed This Visit    None    Visit Diagnoses    Nausea and vomiting during pregnancy prior to [redacted] weeks gestation    -  Primary    Relevant Medications    ondansetron (ZOFRAN) 4 MG tablet    Encounter for supervision of other normal pregnancy in second trimester        Relevant Orders    POCT urinalysis dipstick (Completed)    CBC    HIV antibody    RPR    Glucose      Patient Active Problem List   Diagnosis Date Noted  . H/O gestational diabetes in prior pregnancy, currently pregnant 08/24/2015  . Encounter for supervision of other normal pregnancy in first trimester 07/26/2015    Objective:     BP 104/63 mmHg  Pulse 81  Temp(Src) 97.8 F (36.6 C)  Wt 155 lb (70.308 kg)  LMP 06/20/2015 (LMP Unknown) Uterine Size: Below umbilicus   FHR: 155  Assessment:    Pregnancy @ 3373w2d  weeks Doing well    H/O GDM Plan:    Early 2 hour OGTT today: vomited, testing stopped will repeat in 4 weeks  Problem list reviewed and updated. Labs reviewed.  Follow up in 4 weeks. FIRST/CF mutation testing/NIPT/QUAD SCREEN/fragile X/Ashkenazi Jewish population testing/Spinal muscular atrophy discussed: results reviewed. Role of ultrasound in pregnancy discussed; fetal survey: requested. Amniocentesis discussed: not indicated. 50% of 15 minute visit spent on counseling and coordination of care.

## 2015-09-22 ENCOUNTER — Other Ambulatory Visit (HOSPITAL_COMMUNITY): Payer: Self-pay | Admitting: Certified Nurse Midwife

## 2015-09-22 DIAGNOSIS — D509 Iron deficiency anemia, unspecified: Secondary | ICD-10-CM

## 2015-09-22 LAB — CBC
HEMATOCRIT: 31.8 % — AB (ref 34.0–46.6)
HEMOGLOBIN: 10.4 g/dL — AB (ref 11.1–15.9)
MCH: 22 pg — ABNORMAL LOW (ref 26.6–33.0)
MCHC: 32.7 g/dL (ref 31.5–35.7)
MCV: 67 fL — ABNORMAL LOW (ref 79–97)
Platelets: 273 10*3/uL (ref 150–379)
RBC: 4.73 x10E6/uL (ref 3.77–5.28)
RDW: 16.4 % — AB (ref 12.3–15.4)
WBC: 7.1 10*3/uL (ref 3.4–10.8)

## 2015-09-22 LAB — GLUCOSE, RANDOM: GLUCOSE: 60 mg/dL — AB (ref 65–99)

## 2015-09-22 LAB — HIV ANTIBODY (ROUTINE TESTING W REFLEX): HIV SCREEN 4TH GENERATION: NONREACTIVE

## 2015-09-22 LAB — RPR: RPR: NONREACTIVE

## 2015-09-22 MED ORDER — IRON POLYSACCH CMPLX-B12-FA 150-0.025-1 MG PO CAPS: 1.0000 | ORAL_CAPSULE | Freq: Every day | ORAL | Status: DC

## 2015-10-19 ENCOUNTER — Other Ambulatory Visit: Payer: Medicaid Other

## 2015-10-19 ENCOUNTER — Ambulatory Visit (INDEPENDENT_AMBULATORY_CARE_PROVIDER_SITE_OTHER): Payer: Medicaid Other | Admitting: Certified Nurse Midwife

## 2015-10-19 ENCOUNTER — Telehealth: Payer: Self-pay | Admitting: *Deleted

## 2015-10-19 VITALS — BP 102/64 | HR 90 | Temp 98.1°F | Wt 157.8 lb

## 2015-10-19 DIAGNOSIS — Z3481 Encounter for supervision of other normal pregnancy, first trimester: Secondary | ICD-10-CM | POA: Diagnosis not present

## 2015-10-19 LAB — POCT URINALYSIS DIPSTICK
BILIRUBIN UA: NEGATIVE
GLUCOSE UA: NEGATIVE
KETONES UA: NEGATIVE
Nitrite, UA: NEGATIVE
Protein, UA: NEGATIVE
RBC UA: NEGATIVE
SPEC GRAV UA: 1.01
Urobilinogen, UA: NEGATIVE
pH, UA: 7

## 2015-10-19 NOTE — Progress Notes (Signed)
  Subjective:    Katelyn Bauer is a 25 y.o. female being seen today for her obstetrical visit. She is at [redacted]w[redacted]d gestation. Patient reports: no complaints.  Problem List Items Addressed This Visit      Other   Encounter for supervision of other normal pregnancy in first trimester - Primary   Relevant Orders   US OB Comp + 14 Wk    Other Visit Diagnoses   None.    Patient Active Problem List   Diagnosis Date Noted  . H/O gestational diabetes in prior pregnancy, currently pregnant 08/24/2015  . Encounter for supervision of other normal pregnancy in first trimester 07/26/2015    Objective:     BP 102/64   Pulse 90   Temp 98.1 F (36.7 C)   Wt 157 lb 12.8 oz (71.6 kg)   LMP 06/20/2015 (LMP Unknown)   BMI 26.26 kg/m  Uterine Size: Below umbilicus   FHR: 135 by doppler  Assessment:    Pregnancy @ [redacted]w[redacted]d  weeks Doing well    H/O GDM last pregnancy Plan:   Early 2 hour OGTT today.    Problem list reviewed and updated. Labs reviewed.  Follow up in 4 weeks. FIRST/CF mutation testing/NIPT/QUAD SCREEN/fragile X/Ashkenazi Jewish population testing/Spinal muscular atrophy discussed: results reviewed. Role of ultrasound in pregnancy discussed; fetal survey: ordered. Amniocentesis discussed: not indicated. 50% of 15 minute visit spent on counseling and coordination of care.

## 2015-10-19 NOTE — Addendum Note (Signed)
Addended by: Marya Landry D on: 10/19/2015 10:46 AM   Modules accepted: Orders

## 2015-10-20 LAB — GLUCOSE TOLERANCE, 2 HOURS W/ 1HR
GLUCOSE, 2 HOUR: 100 mg/dL (ref 65–152)
Glucose, 1 hour: 108 mg/dL (ref 65–179)
Glucose, Fasting: 75 mg/dL (ref 65–91)

## 2015-11-02 NOTE — Telephone Encounter (Signed)
Message left for patient that she has additional iron tablet sent to her pharmacy because her Hgb is low.

## 2015-11-07 ENCOUNTER — Ambulatory Visit (INDEPENDENT_AMBULATORY_CARE_PROVIDER_SITE_OTHER): Payer: Medicaid Other | Admitting: Certified Nurse Midwife

## 2015-11-07 ENCOUNTER — Encounter: Payer: Medicaid Other | Admitting: Certified Nurse Midwife

## 2015-11-07 ENCOUNTER — Other Ambulatory Visit: Payer: Medicaid Other

## 2015-11-07 ENCOUNTER — Ambulatory Visit (INDEPENDENT_AMBULATORY_CARE_PROVIDER_SITE_OTHER): Payer: Medicaid Other

## 2015-11-07 VITALS — BP 104/62 | HR 84 | Temp 98.4°F | Wt 161.7 lb

## 2015-11-07 DIAGNOSIS — Z3481 Encounter for supervision of other normal pregnancy, first trimester: Secondary | ICD-10-CM

## 2015-11-07 DIAGNOSIS — Z1389 Encounter for screening for other disorder: Secondary | ICD-10-CM

## 2015-11-07 DIAGNOSIS — Z36 Encounter for antenatal screening of mother: Secondary | ICD-10-CM

## 2015-11-07 DIAGNOSIS — Z3492 Encounter for supervision of normal pregnancy, unspecified, second trimester: Secondary | ICD-10-CM

## 2015-11-07 DIAGNOSIS — Z331 Pregnant state, incidental: Secondary | ICD-10-CM

## 2015-11-07 DIAGNOSIS — Z3482 Encounter for supervision of other normal pregnancy, second trimester: Secondary | ICD-10-CM

## 2015-11-07 LAB — POCT URINALYSIS DIPSTICK
Bilirubin, UA: NEGATIVE
Glucose, UA: NEGATIVE
KETONES UA: NEGATIVE
Nitrite, UA: NEGATIVE
PH UA: 7
RBC UA: NEGATIVE
SPEC GRAV UA: 1.015
UROBILINOGEN UA: 0.2

## 2015-11-07 NOTE — Progress Notes (Signed)
Subjective:    Katelyn Bauer is a 25 y.o. female being seen today for her obstetrical visit. She is at 4074w0d gestation. Patient reports: no complaints . Fetal movement: normal.  Problem List Items Addressed This Visit    None    Visit Diagnoses    Prenatal care, second trimester    -  Primary   Relevant Orders   POCT Urinalysis Dipstick (Completed)   Encounter for supervision of other normal pregnancy in second trimester         Patient Active Problem List   Diagnosis Date Noted  . H/O gestational diabetes in prior pregnancy, currently pregnant 08/24/2015  . Encounter for supervision of other normal pregnancy in first trimester 07/26/2015   Objective:    BP 104/62   Pulse 84   Temp 98.4 F (36.9 C)   Wt 161 lb 11.2 oz (73.3 kg)   LMP 06/20/2015 (LMP Unknown)   BMI 26.91 kg/m  FHT: 150 BPM  Uterine Size: size equals dates and at U     Assessment:    Pregnancy @ 4974w0d    Doing well   h/o GDM last pregnancy: early 2 hour WNL  Plan:    OBGCT: discussed. Signs and symptoms of preterm labor: discussed.  Labs, problem list reviewed and updated 2 hr GTT planned Follow up in 4 weeks.

## 2015-11-07 NOTE — Progress Notes (Signed)
Pt denies concerns at this time. 

## 2015-11-14 ENCOUNTER — Other Ambulatory Visit: Payer: Self-pay | Admitting: Certified Nurse Midwife

## 2015-11-14 DIAGNOSIS — Z3481 Encounter for supervision of other normal pregnancy, first trimester: Secondary | ICD-10-CM

## 2015-12-05 ENCOUNTER — Encounter: Payer: Self-pay | Admitting: Certified Nurse Midwife

## 2015-12-05 ENCOUNTER — Ambulatory Visit (INDEPENDENT_AMBULATORY_CARE_PROVIDER_SITE_OTHER): Payer: Medicaid Other | Admitting: Certified Nurse Midwife

## 2015-12-05 VITALS — BP 110/70 | HR 82 | Wt 164.0 lb

## 2015-12-05 DIAGNOSIS — Z3482 Encounter for supervision of other normal pregnancy, second trimester: Secondary | ICD-10-CM | POA: Diagnosis not present

## 2015-12-05 DIAGNOSIS — Z3481 Encounter for supervision of other normal pregnancy, first trimester: Secondary | ICD-10-CM

## 2015-12-05 NOTE — Progress Notes (Signed)
Subjective:    Katelyn BoschLeona Curley is a 25 y.o. female being seen today for her obstetrical visit. She is at 7460w0d gestation. Patient reports: backache, no bleeding, no contractions, no cramping and no leaking . Fetal movement: normal.  Problem List Items Addressed This Visit    None    Visit Diagnoses    Encounter for supervision of other normal pregnancy in second trimester    -  Primary   Relevant Orders   POCT urinalysis dipstick     Patient Active Problem List   Diagnosis Date Noted  . H/O gestational diabetes in prior pregnancy, currently pregnant 08/24/2015  . Encounter for supervision of other normal pregnancy in first trimester 07/26/2015   Objective:    BP 110/70   Pulse 82   Wt 164 lb (74.4 kg)   LMP 06/20/2015 (LMP Unknown)   BMI 27.29 kg/m  FHT: 140 BPM  Uterine Size: 24 cm and size equals dates     Assessment:    Pregnancy @ 5360w0d    Lumbar back pain in pregnancy Plan:   Rx: abdominal support belt   OBGCT: discussed and ordered for next visit. Signs and symptoms of preterm labor: discussed and handout given.  Labs, problem list reviewed and updated 2 hr GTT planned Follow up in 4 weeks.

## 2015-12-05 NOTE — Patient Instructions (Addendum)
How a Baby Grows During Pregnancy Pregnancy begins when a female's sperm enters a female's egg (fertilization). This happens in one of the tubes (fallopian tubes) that connect the ovaries to the womb (uterus). The fertilized egg is called an embryo until it reaches 10 weeks. From 10 weeks until birth, it is called a fetus. The fertilized egg moves down the fallopian tube to the uterus. Then it implants into the lining of the uterus and begins to grow. The developing fetus receives oxygen and nutrients through the pregnant woman's bloodstream and the tissues that grow (placenta) to support the fetus. The placenta is the life support system for the fetus. It provides nutrition and removes waste. Learning as much as you can about your pregnancy and how your baby is developing can help you enjoy the experience. It can also make you aware of when there might be a problem and when to ask questions. HOW LONG DOES A TYPICAL PREGNANCY LAST? A pregnancy usually lasts 280 days, or about 40 weeks. Pregnancy is divided into three trimesters:  First trimester: 0-13 weeks.  Second trimester: 14-27 weeks.  Third trimester: 28-40 weeks. The day when your baby is considered ready to be born (full term) is your estimated date of delivery. HOW DOES MY BABY DEVELOP MONTH BY MONTH? First month  The fertilized egg attaches to the inside of the uterus.  Some cells will form the placenta. Others will form the fetus.  The arms, legs, brain, spinal cord, lungs, and heart begin to develop.  At the end of the first month, the heart begins to beat. Second month  The bones, inner ear, eyelids, hands, and feet form.  The genitals develop.  By the end of 8 weeks, all major organs are developing. Third month  All of the internal organs are forming.  Teeth develop below the gums.  Bones and muscles begin to grow. The spine can flex.  The skin is transparent.  Fingernails and toenails begin to form.  Arms and  legs continue to grow longer, and hands and feet develop.  The fetus is about 3 in (7.6 cm) long. Fourth month  The placenta is completely formed.  The external sex organs, neck, outer ear, eyebrows, eyelids, and fingernails are formed.  The fetus can hear, swallow, and move its arms and legs.  The kidneys begin to produce urine.  The skin is covered with a white waxy coating (vernix) and very fine hair (lanugo). Fifth month  The fetus moves around more and can be felt for the first time (quickening).  The fetus starts to sleep and wake up and may begin to suck its finger.  The nails grow to the end of the fingers.  The organ in the digestive system that makes bile (gallbladder) functions and helps to digest the nutrients.  If your baby is a girl, eggs are present in her ovaries. If your baby is a boy, testicles start to move down into his scrotum. Sixth month  The lungs are formed, but the fetus is not yet able to breathe.  The eyes open. The brain continues to develop.  Your baby has fingerprints and toe prints. Your baby's hair grows thicker.  At the end of the second trimester, the fetus is about 9 in (22.9 cm) long. Seventh month  The fetus kicks and stretches.  The eyes are developed enough to sense changes in light.  The hands can make a grasping motion.  The fetus responds to sound. Eighth month  All organs and body systems are fully developed and functioning.  Bones harden and taste buds develop. The fetus may hiccup.  Certain areas of the brain are still developing. The skull remains soft. Ninth month  The fetus gains about  lb (0.23 kg) each week.  The lungs are fully developed.  Patterns of sleep develop.  The fetus's head typically moves into a head-down position (vertex) in the uterus to prepare for birth. If the buttocks move into a vertex position instead, the baby is breech.  The fetus weighs 6-9 lbs (2.72-4.08 kg) and is 19-20 in  (48.26-50.8 cm) long. WHAT CAN I DO TO HAVE A HEALTHY PREGNANCY AND HELP MY BABY DEVELOP? Eating and Drinking  Eat a healthy diet.  Talk with your health care provider to make sure that you are getting the nutrients that you and your baby need.  Visit www.BuildDNA.es to learn about creating a healthy diet.  Gain a healthy amount of weight during pregnancy as advised by your health care provider. This is usually 25-35 pounds. You may need to:  Gain more if you were underweight before getting pregnant or if you are pregnant with more than one baby.  Gain less if you were overweight or obese when you got pregnant. Medicines and Vitamins  Take prenatal vitamins as directed by your health care provider. These include vitamins such as folic acid, iron, calcium, and vitamin D. They are important for healthy development.  Take medicines only as directed by your health care provider. Read labels and ask a pharmacist or your health care provider whether over-the-counter medicines, supplements, and prescription drugs are safe to take during pregnancy. Activities  Be physically active as advised by your health care provider. Ask your health care provider to recommend activities that are safe for you to do, such as walking or swimming.  Do not participate in strenuous or extreme sports. Lifestyle  Do not drink alcohol.  Do not use any tobacco products, including cigarettes, chewing tobacco, or electronic cigarettes. If you need help quitting, ask your health care provider.  Do not use illegal drugs. Safety  Avoid exposure to mercury, lead, or other heavy metals. Ask your health care provider about common sources of these heavy metals.  Avoid listeria infection during pregnancy. Follow these precautions:  Do not eat soft cheeses or deli meats.  Do not eat hot dogs unless they have been warmed up to the point of steaming, such as in the microwave oven.  Do not drink unpasteurized  milk.  Avoid toxoplasmosis infection during pregnancy. Follow these precautions:  Do not change your cat's litter box, if you have a cat. Ask someone else to do this for you.  Wear gardening gloves while working in the yard. General Instructions  Keep all follow-up visits as directed by your health care provider. This is important. This includes prenatal care and screening tests.  Manage any chronic health conditions. Work closely with your health care provider to keep conditions, such as diabetes, under control. HOW DO I KNOW IF MY BABY IS DEVELOPING WELL? At each prenatal visit, your health care provider will do several different tests to check on your health and keep track of your baby's development. These include:  Fundal height.  Your health care provider will measure your growing belly from top to bottom using a tape measure.  Your health care provider will also feel your belly to determine your baby's position.  Heartbeat.  An ultrasound in the first trimester can confirm  pregnancy and show a heartbeat, depending on how far along you are.  Your health care provider will check your baby's heart rate at every prenatal visit.  As you get closer to your delivery date, you may have regular fetal heart rate monitoring to make sure that your baby is not in distress.  Second trimester ultrasound.  This ultrasound checks your baby's development. It also indicates your baby's gender. WHAT SHOULD I DO IF I HAVE CONCERNS ABOUT MY BABY'S DEVELOPMENT? Always talk with your health care provider about any concerns that you may have.   This information is not intended to replace advice given to you by your health care provider. Make sure you discuss any questions you have with your health care provider.   Document Released: 08/28/2007 Document Revised: 11/30/2014 Document Reviewed: 08/18/2013 Elsevier Interactive Patient Education 2016 [employer]  Before Baby Comes Home Before your  baby arrives it is important to:  Have all of the supplies that you will need to care for your baby.  Know where to go if there is an emergency.  Discuss the baby's arrival with other family members. WHAT SUPPLIES WILL I NEED? It is recommended that you have the following supplies: Large Items  Crib.  Crib mattress.  Rear-facing infant car seat. If possible, have a trained professional check to make sure that it is installed correctly. Feeding  6-8 bottles that are 4-5 oz in size.  6-8 nipples.  Bottle brush.  Sterilizer, or a large pan or kettle with a lid.  A way to boil and cool water.  If you will be breastfeeding:  Breast pump.  Nipple cream.  Nursing bra.  Breast pads.  Breast shields.  If you will be formula feeding:  Formula.  Measuring cups.  Measuring spoons. Bathing  Mild baby soap and baby shampoo.  Petroleum jelly.  Soft cloth towel and washcloth.  Hooded towel.  Cotton balls.  Bath basin. Other Supplies  Rectal thermometer.  Bulb syringe.  Baby wipes or washcloths for diaper changes.  Diaper bag.  Changing pad.  Clothing, including one-piece outfits and pajamas.  Baby nail clippers.  Receiving blankets.  Mattress pad and sheets for the crib.  Night-light for the baby's room.  Baby monitor.  2 or 3 pacifiers.  Either 24-36 cloth diapers and waterproof diaper covers or a box of disposable diapers. You may need to use as many as 10-12 diapers per day. HOW DO I PREPARE FOR AN EMERGENCY? Prepare for an emergency by:  Knowing how to get to the nearest hospital.  Listing the phone numbers of your baby's health care providers near your home phone and in your cell phone. HOW DO I PREPARE MY FAMILY?  Decide how to handle visitors.  If you have other children:  Talk with them about the baby coming home. Ask them how they feel about it.  Read a book together about being a new big brother or sister.  Find ways to  let them help you prepare for the new baby.  Have someone ready to care for them while you are in the hospital.   This information is not intended to replace advice given to you by your health care provider. Make sure you discuss any questions you have with your health care provider.   Document Released: 02/22/2008 Document Revised: 07/26/2014 Document Reviewed: 02/16/2014 Elsevier Interactive Patient Education 2016 [employer] Preterm Labor Information Preterm labor is when labor starts at less than 37 weeks of pregnancy. The normal  length of a pregnancy is 39 to 41 weeks. CAUSES Often, there is no identifiable underlying cause as to why a woman goes into preterm labor. One of the most common known causes of preterm labor is infection. Infections of the uterus, cervix, vagina, amniotic sac, bladder, kidney, or even the lungs (pneumonia) can cause labor to start. Other suspected causes of preterm labor include:   Urogenital infections, such as yeast infections and bacterial vaginosis.   Uterine abnormalities (uterine shape, uterine septum, fibroids, or bleeding from the placenta).   A cervix that has been operated on (it may fail to stay closed).   Malformations in the fetus.   Multiple gestations (twins, triplets, and so on).   Breakage of the amniotic sac.  RISK FACTORS  Having a previous history of preterm labor.   Having premature rupture of membranes (PROM).   Having a placenta that covers the opening of the cervix (placenta previa).   Having a placenta that separates from the uterus (placental abruption).   Having a cervix that is too weak to hold the fetus in the uterus (incompetent cervix).   Having too much fluid in the amniotic sac (polyhydramnios).   Taking illegal drugs or smoking while pregnant.   Not gaining enough weight while pregnant.   Being younger than 54 and older than 24 years old.   Having a low socioeconomic status.   Being  African American. SYMPTOMS Signs and symptoms of preterm labor include:   Menstrual-like cramps, abdominal pain, or back pain.  Uterine contractions that are regular, as frequent as six in an hour, regardless of their intensity (may be mild or painful).  Contractions that start on the top of the uterus and spread down to the lower abdomen and back.   A sense of increased pelvic pressure.   A watery or bloody mucus discharge that comes from the vagina.  TREATMENT Depending on the length of the pregnancy and other circumstances, your health care provider may suggest bed rest. If necessary, there are medicines that can be given to stop contractions and to mature the fetal lungs. If labor happens before 34 weeks of pregnancy, a prolonged hospital stay may be recommended. Treatment depends on the condition of both you and the fetus.  WHAT SHOULD YOU DO IF YOU THINK YOU ARE IN PRETERM LABOR? Call your health care provider right away. You will need to go to the hospital to get checked immediately. HOW CAN YOU PREVENT PRETERM LABOR IN FUTURE PREGNANCIES? You should:   Stop smoking if you smoke.  Maintain healthy weight gain and avoid chemicals and drugs that are not necessary.  Be watchful for any type of infection.  Inform your health care provider if you have a known history of preterm labor.   This information is not intended to replace advice given to you by your health care provider. Make sure you discuss any questions you have with your health care provider.   Document Released: 06/01/2003 Document Revised: 11/11/2012 Document Reviewed: 04/13/2012 Elsevier Interactive Patient Education 2016 [employer]. Second Trimester of Pregnancy The second trimester is from week 13 through week 28, months 4 through 6. The second trimester is often a time when you feel your best. Your body has also adjusted to being pregnant, and you begin to feel better physically. Usually, morning sickness  has lessened or quit completely, you may have more energy, and you may have an increase in appetite. The second trimester is also a time when the fetus is  growing rapidly. At the end of the sixth month, the fetus is about 9 inches long and weighs about 1 pounds. You will likely begin to feel the baby move (quickening) between 18 and 20 weeks of the pregnancy. BODY CHANGES Your body goes through many changes during pregnancy. The changes vary from woman to woman.   Your weight will continue to increase. You will notice your lower abdomen bulging out.  You may begin to get stretch marks on your hips, abdomen, and breasts.  You may develop headaches that can be relieved by medicines approved by your health care provider.  You may urinate more often because the fetus is pressing on your bladder.  You may develop or continue to have heartburn as a result of your pregnancy.  You may develop constipation because certain hormones are causing the muscles that push waste through your intestines to slow down.  You may develop hemorrhoids or swollen, bulging veins (varicose veins).  You may have back pain because of the weight gain and pregnancy hormones relaxing your joints between the bones in your pelvis and as a result of a shift in weight and the muscles that support your balance.  Your breasts will continue to grow and be tender.  Your gums may bleed and may be sensitive to brushing and flossing.  Dark spots or blotches (chloasma, mask of pregnancy) may develop on your face. This will likely fade after the baby is born.  A dark line from your belly button to the pubic area (linea nigra) may appear. This will likely fade after the baby is born.  You may have changes in your hair. These can include thickening of your hair, rapid growth, and changes in texture. Some women also have hair loss during or after pregnancy, or hair that feels dry or thin. Your hair will most likely return to normal  after your baby is born. WHAT TO EXPECT AT YOUR PRENATAL VISITS During a routine prenatal visit:  You will be weighed to make sure you and the fetus are growing normally.  Your blood pressure will be taken.  Your abdomen will be measured to track your baby's growth.  The fetal heartbeat will be listened to.  Any test results from the previous visit will be discussed. Your health care provider may ask you:  How you are feeling.  If you are feeling the baby move.  If you have had any abnormal symptoms, such as leaking fluid, bleeding, severe headaches, or abdominal cramping.  If you are using any tobacco products, including cigarettes, chewing tobacco, and electronic cigarettes.  If you have any questions. Other tests that may be performed during your second trimester include:  Blood tests that check for:  Low iron levels (anemia).  Gestational diabetes (between 24 and 28 weeks).  Rh antibodies.  Urine tests to check for infections, diabetes, or protein in the urine.  An ultrasound to confirm the proper growth and development of the baby.  An amniocentesis to check for possible genetic problems.  Fetal screens for spina bifida and Down syndrome.  HIV (human immunodeficiency virus) testing. Routine prenatal testing includes screening for HIV, unless you choose not to have this test. HOME CARE INSTRUCTIONS   Avoid all smoking, herbs, alcohol, and unprescribed drugs. These chemicals affect the formation and growth of the baby.  Do not use any tobacco products, including cigarettes, chewing tobacco, and electronic cigarettes. If you need help quitting, ask your health care provider. You may receive counseling support and  other resources to help you quit.  Follow your health care provider's instructions regarding medicine use. There are medicines that are either safe or unsafe to take during pregnancy.  Exercise only as directed by your health care provider. Experiencing  uterine cramps is a good sign to stop exercising.  Continue to eat regular, healthy meals.  Wear a good support bra for breast tenderness.  Do not use hot tubs, steam rooms, or saunas.  Wear your seat belt at all times when driving.  Avoid raw meat, uncooked cheese, cat litter boxes, and soil used by cats. These carry germs that can cause birth defects in the baby.  Take your prenatal vitamins.  Take 1500-2000 mg of calcium daily starting at the 20th week of pregnancy until you deliver your baby.  Try taking a stool softener (if your health care provider approves) if you develop constipation. Eat more high-fiber foods, such as fresh vegetables or fruit and whole grains. Drink plenty of fluids to keep your urine clear or pale yellow.  Take warm sitz baths to soothe any pain or discomfort caused by hemorrhoids. Use hemorrhoid cream if your health care provider approves.  If you develop varicose veins, wear support hose. Elevate your feet for 15 minutes, 3-4 times a day. Limit salt in your diet.  Avoid heavy lifting, wear low heel shoes, and practice good posture.  Rest with your legs elevated if you have leg cramps or low back pain.  Visit your dentist if you have not gone yet during your pregnancy. Use a soft toothbrush to brush your teeth and be gentle when you floss.  A sexual relationship may be continued unless your health care provider directs you otherwise.  Continue to go to all your prenatal visits as directed by your health care provider. SEEK MEDICAL CARE IF:   You have dizziness.  You have mild pelvic cramps, pelvic pressure, or nagging pain in the abdominal area.  You have persistent nausea, vomiting, or diarrhea.  You have a bad smelling vaginal discharge.  You have pain with urination. SEEK IMMEDIATE MEDICAL CARE IF:   You have a fever.  You are leaking fluid from your vagina.  You have spotting or bleeding from your vagina.  You have severe abdominal  cramping or pain.  You have rapid weight gain or loss.  You have shortness of breath with chest pain.  You notice sudden or extreme swelling of your face, hands, ankles, feet, or legs.  You have not felt your baby move in over an hour.  You have severe headaches that do not go away with medicine.  You have vision changes.   This information is not intended to replace advice given to you by your health care provider. Make sure you discuss any questions you have with your health care provider.   Document Released: 03/05/2001 Document Revised: 04/01/2014 Document Reviewed: 05/12/2012 Elsevier Interactive Patient Education [employer]

## 2015-12-14 ENCOUNTER — Encounter: Payer: Self-pay | Admitting: *Deleted

## 2016-01-05 ENCOUNTER — Ambulatory Visit (INDEPENDENT_AMBULATORY_CARE_PROVIDER_SITE_OTHER): Payer: Medicaid Other | Admitting: Certified Nurse Midwife

## 2016-01-05 DIAGNOSIS — Z3481 Encounter for supervision of other normal pregnancy, first trimester: Secondary | ICD-10-CM

## 2016-01-05 MED ORDER — TETANUS-DIPHTH-ACELL PERTUSSIS 5-2.5-18.5 LF-MCG/0.5 IM SUSP
0.5000 mL | Freq: Once | INTRAMUSCULAR | Status: AC
  Administered 2016-01-05: 0.5 mL via INTRAMUSCULAR

## 2016-01-05 NOTE — Patient Instructions (Addendum)
Preterm Labor Information Preterm labor is when labor starts at less than 37 weeks of pregnancy. The normal length of a pregnancy is 39 to 41 weeks. CAUSES Often, there is no identifiable underlying cause as to why a woman goes into preterm labor. One of the most common known causes of preterm labor is infection. Infections of the uterus, cervix, vagina, amniotic sac, bladder, kidney, or even the lungs (pneumonia) can cause labor to start. Other suspected causes of preterm labor include:   Urogenital infections, such as yeast infections and bacterial vaginosis.   Uterine abnormalities (uterine shape, uterine septum, fibroids, or bleeding from the placenta).   A cervix that has been operated on (it may fail to stay closed).   Malformations in the fetus.   Multiple gestations (twins, triplets, and so on).   Breakage of the amniotic sac.  RISK FACTORS  Having a previous history of preterm labor.   Having premature rupture of membranes (PROM).   Having a placenta that covers the opening of the cervix (placenta previa).   Having a placenta that separates from the uterus (placental abruption).   Having a cervix that is too weak to hold the fetus in the uterus (incompetent cervix).   Having too much fluid in the amniotic sac (polyhydramnios).   Taking illegal drugs or smoking while pregnant.   Not gaining enough weight while pregnant.   Being younger than 26 and older than 24 years old.   Having a low socioeconomic status.   Being African American. SYMPTOMS Signs and symptoms of preterm labor include:   Menstrual-like cramps, abdominal pain, or back pain.  Uterine contractions that are regular, as frequent as six in an hour, regardless of their intensity (may be mild or painful).  Contractions that start on the top of the uterus and spread down to the lower abdomen and back.   A sense of increased pelvic pressure.   A watery or bloody mucus discharge that  comes from the vagina.  TREATMENT Depending on the length of the pregnancy and other circumstances, your health care provider may suggest bed rest. If necessary, there are medicines that can be given to stop contractions and to mature the fetal lungs. If labor happens before 34 weeks of pregnancy, a prolonged hospital stay may be recommended. Treatment depends on the condition of both you and the fetus.  WHAT SHOULD YOU DO IF YOU THINK YOU ARE IN PRETERM LABOR? Call your health care provider right away. You will need to go to the hospital to get checked immediately. HOW CAN YOU PREVENT PRETERM LABOR IN FUTURE PREGNANCIES? You should:   Stop smoking if you smoke.  Maintain healthy weight gain and avoid chemicals and drugs that are not necessary.  Be watchful for any type of infection.  Inform your health care provider if you have a known history of preterm labor.   This information is not intended to replace advice given to you by your health care provider. Make sure you discuss any questions you have with your health care provider.   Document Released: 06/01/2003 Document Revised: 11/11/2012 Document Reviewed: 04/13/2012 Elsevier Interactive Patient Education 2016 [employer]. Kegel Exercises The goal of Kegel exercises is to isolate and exercise your pelvic floor muscles. These muscles act as a hammock that supports the rectum, vagina, small intestine, and uterus. As the muscles weaken, the hammock sags and these organs are displaced from their normal positions. Kegel exercises can strengthen your pelvic floor muscles and help you to improve  bladder and bowel control, improve sexual response, and help reduce many problems and some discomfort during pregnancy. Kegel exercises can be done anywhere and at any time. HOW TO PERFORM KEGEL EXERCISES 1. Locate your pelvic floor muscles. To do this, squeeze (contract) the muscles that you use when you try to stop the flow of urine. You will feel a  tightness in the vaginal area (women) and a tight lift in the rectal area (men and women). 2. When you begin, contract your pelvic muscles tight for 2-5 seconds, then relax them for 2-5 seconds. This is one set. Do 4-5 sets with a short pause in between. 3. Contract your pelvic muscles for 8-10 seconds, then relax them for 8-10 seconds. Do 4-5 sets. If you cannot contract your pelvic muscles for 8-10 seconds, try 5-7 seconds and work your way up to 8-10 seconds. Your goal is 4-5 sets of 10 contractions each day. Keep your stomach, buttocks, and legs relaxed during the exercises. Perform sets of both short and long contractions. Vary your positions. Perform these contractions 3-4 times per day. Perform sets while you are:   Lying in bed in the morning.  Standing at lunch.  Sitting in the late afternoon.  Lying in bed at night. You should do 40-50 contractions per day. Do not perform more Kegel exercises per day than recommended. Overexercising can cause muscle fatigue. Continue these exercises for for at least 15-20 weeks or as directed by your caregiver.   This information is not intended to replace advice given to you by your health care provider. Make sure you discuss any questions you have with your health care provider.   Document Released: 02/26/2012 Document Revised: 04/01/2014 Document Reviewed: 02/26/2012 Elsevier Interactive Patient Education [employer] Third Trimester of Pregnancy The third trimester is from week 29 through week 42, months 7 through 9. The third trimester is a time when the fetus is growing rapidly. At the end of the ninth month, the fetus is about 20 inches in length and weighs 6-10 pounds.  BODY CHANGES Your body goes through many changes during pregnancy. The changes vary from woman to woman.   Your weight will continue to increase. You can expect to gain 25-35 pounds (11-16 kg) by the end of the pregnancy.  You may begin to get stretch marks on your  hips, abdomen, and breasts.  You may urinate more often because the fetus is moving lower into your pelvis and pressing on your bladder.  You may develop or continue to have heartburn as a result of your pregnancy.  You may develop constipation because certain hormones are causing the muscles that push waste through your intestines to slow down.  You may develop hemorrhoids or swollen, bulging veins (varicose veins).  You may have pelvic pain because of the weight gain and pregnancy hormones relaxing your joints between the bones in your pelvis. Backaches may result from overexertion of the muscles supporting your posture.  You may have changes in your hair. These can include thickening of your hair, rapid growth, and changes in texture. Some women also have hair loss during or after pregnancy, or hair that feels dry or thin. Your hair will most likely return to normal after your baby is born.  Your breasts will continue to grow and be tender. A yellow discharge may leak from your breasts called colostrum.  Your belly button may stick out.  You may feel short of breath because of your expanding uterus.  You may  notice the fetus "dropping," or moving lower in your abdomen.  You may have a bloody mucus discharge. This usually occurs a few days to a week before labor begins.  Your cervix becomes thin and soft (effaced) near your due date. WHAT TO EXPECT AT YOUR PRENATAL EXAMS  You will have prenatal exams every 2 weeks until week 36. Then, you will have weekly prenatal exams. During a routine prenatal visit:  You will be weighed to make sure you and the fetus are growing normally.  Your blood pressure is taken.  Your abdomen will be measured to track your baby's growth.  The fetal heartbeat will be listened to.  Any test results from the previous visit will be discussed.  You may have a cervical check near your due date to see if you have effaced. At around 36 weeks, your  caregiver will check your cervix. At the same time, your caregiver will also perform a test on the secretions of the vaginal tissue. This test is to determine if a type of bacteria, Group B streptococcus, is present. Your caregiver will explain this further. Your caregiver may ask you:  What your birth plan is.  How you are feeling.  If you are feeling the baby move.  If you have had any abnormal symptoms, such as leaking fluid, bleeding, severe headaches, or abdominal cramping.  If you are using any tobacco products, including cigarettes, chewing tobacco, and electronic cigarettes.  If you have any questions. Other tests or screenings that may be performed during your third trimester include:  Blood tests that check for low iron levels (anemia).  Fetal testing to check the health, activity level, and growth of the fetus. Testing is done if you have certain medical conditions or if there are problems during the pregnancy.  HIV (human immunodeficiency virus) testing. If you are at high risk, you may be screened for HIV during your third trimester of pregnancy. FALSE LABOR You may feel small, irregular contractions that eventually go away. These are called Braxton Hicks contractions, or false labor. Contractions may last for hours, days, or even weeks before true labor sets in. If contractions come at regular intervals, intensify, or become painful, it is best to be seen by your caregiver.  SIGNS OF LABOR   Menstrual-like cramps.  Contractions that are 5 minutes apart or less.  Contractions that start on the top of the uterus and spread down to the lower abdomen and back.  A sense of increased pelvic pressure or back pain.  A watery or bloody mucus discharge that comes from the vagina. If you have any of these signs before the 37th week of pregnancy, call your caregiver right away. You need to go to the hospital to get checked immediately. HOME CARE INSTRUCTIONS   Avoid all  smoking, herbs, alcohol, and unprescribed drugs. These chemicals affect the formation and growth of the baby.  Do not use any tobacco products, including cigarettes, chewing tobacco, and electronic cigarettes. If you need help quitting, ask your health care provider. You may receive counseling support and other resources to help you quit.  Follow your caregiver's instructions regarding medicine use. There are medicines that are either safe or unsafe to take during pregnancy.  Exercise only as directed by your caregiver. Experiencing uterine cramps is a good sign to stop exercising.  Continue to eat regular, healthy meals.  Wear a good support bra for breast tenderness.  Do not use hot tubs, steam rooms, or saunas.  Wear  your seat belt at all times when driving.  Avoid raw meat, uncooked cheese, cat litter boxes, and soil used by cats. These carry germs that can cause birth defects in the baby.  Take your prenatal vitamins.  Take 1500-2000 mg of calcium daily starting at the 20th week of pregnancy until you deliver your baby.  Try taking a stool softener (if your caregiver approves) if you develop constipation. Eat more high-fiber foods, such as fresh vegetables or fruit and whole grains. Drink plenty of fluids to keep your urine clear or pale yellow.  Take warm sitz baths to soothe any pain or discomfort caused by hemorrhoids. Use hemorrhoid cream if your caregiver approves.  If you develop varicose veins, wear support hose. Elevate your feet for 15 minutes, 3-4 times a day. Limit salt in your diet.  Avoid heavy lifting, wear low heal shoes, and practice good posture.  Rest a lot with your legs elevated if you have leg cramps or low back pain.  Visit your dentist if you have not gone during your pregnancy. Use a soft toothbrush to brush your teeth and be gentle when you floss.  A sexual relationship may be continued unless your caregiver directs you otherwise.  Do not travel far  distances unless it is absolutely necessary and only with the approval of your caregiver.  Take prenatal classes to understand, practice, and ask questions about the labor and delivery.  Make a trial run to the hospital.  Pack your hospital bag.  Prepare the baby's nursery.  Continue to go to all your prenatal visits as directed by your caregiver. SEEK MEDICAL CARE IF:  You are unsure if you are in labor or if your water has broken.  You have dizziness.  You have mild pelvic cramps, pelvic pressure, or nagging pain in your abdominal area.  You have persistent nausea, vomiting, or diarrhea.  You have a bad smelling vaginal discharge.  You have pain with urination. SEEK IMMEDIATE MEDICAL CARE IF:   You have a fever.  You are leaking fluid from your vagina.  You have spotting or bleeding from your vagina.  You have severe abdominal cramping or pain.  You have rapid weight loss or gain.  You have shortness of breath with chest pain.  You notice sudden or extreme swelling of your face, hands, ankles, feet, or legs.  You have not felt your baby move in over an hour.  You have severe headaches that do not go away with medicine.  You have vision changes.   This information is not intended to replace advice given to you by your health care provider. Make sure you discuss any questions you have with your health care provider.   Document Released: 03/05/2001 Document Revised: 04/01/2014 Document Reviewed: 05/12/2012 Elsevier Interactive Patient Education [employer]

## 2016-01-05 NOTE — Progress Notes (Signed)
Subjective:    Katelyn BoschLeona Bauer is a [age] y.o. female being seen today for her obstetrical visit. She is at 1736w3d gestation. Patient reports backache, no bleeding, no contractions, no cramping and no leaking. Fetal movement: normal.  Problem List Items Addressed This Visit      Other   Encounter for supervision of other normal pregnancy in first trimester    Other Visit Diagnoses   None.    Patient Active Problem List   Diagnosis Date Noted  . H/O gestational diabetes in prior pregnancy, currently pregnant 08/24/2015  . Encounter for supervision of other normal pregnancy in first trimester 07/26/2015   Objective:    BP 102/66   Pulse 88   Temp 98.2 F (36.8 C)   Wt 167 lb 12.8 oz (76.1 kg)   LMP 06/20/2015 (LMP Unknown)   BMI 27.92 kg/m  FHT:  136 BPM  Uterine Size: 28 cm and size equals dates  Presentation: cephalic     Assessment:    Pregnancy @ 6536w3d weeks   Plan:     labs reviewed, problem list updated Consent signed. GBS planning TDAP given 01/05/16 [facility] Pediatrician: discussed. Infant feeding: plans to breastfeed. Maternity leave: n/a. Cigarette smoking: never smoked. No orders of the defined types were placed in this encounter.  No orders of the defined types were placed in this encounter.  Follow up in 2 Weeks.

## 2016-01-05 NOTE — Progress Notes (Signed)
Administered tdap, pt tolerated well, pt declined flu vaccine. .. Administrations This Visit    Tdap (BOOSTRIX) injection 0.5 mL    Admin Date 01/05/2016 Action Given Dose 0.5 mL Route Intramuscular Administered By Katrina StackBrittany D Stalling, RN

## 2016-01-05 NOTE — Addendum Note (Signed)
Addended by: Natale MilchSTALLING, BRITTANY D on: 01/05/2016 11:03 AM   Modules accepted: Orders

## 2016-01-05 NOTE — Progress Notes (Signed)
Patient reports good fetal movement, states that back hurts with position changes.

## 2016-01-09 ENCOUNTER — Other Ambulatory Visit: Payer: Medicaid Other

## 2016-01-09 DIAGNOSIS — Z3483 Encounter for supervision of other normal pregnancy, third trimester: Secondary | ICD-10-CM

## 2016-01-10 LAB — CBC
HEMOGLOBIN: 9.6 g/dL — AB (ref 11.1–15.9)
Hematocrit: 28.4 % — ABNORMAL LOW (ref 34.0–46.6)
MCH: 22.3 pg — AB (ref 26.6–33.0)
MCHC: 33.8 g/dL (ref 31.5–35.7)
MCV: 66 fL — AB (ref 79–97)
Platelets: 231 10*3/uL (ref 150–379)
RBC: 4.31 x10E6/uL (ref 3.77–5.28)
RDW: 14.9 % (ref 12.3–15.4)
WBC: 7.3 10*3/uL (ref 3.4–10.8)

## 2016-01-10 LAB — GLUCOSE TOLERANCE, 2 HOURS W/ 1HR
GLUCOSE, 1 HOUR: 179 mg/dL (ref 65–179)
Glucose, 2 hour: 110 mg/dL (ref 65–152)
Glucose, Fasting: 71 mg/dL (ref 65–91)

## 2016-01-10 LAB — RPR: RPR Ser Ql: NONREACTIVE

## 2016-01-10 LAB — HIV ANTIBODY (ROUTINE TESTING W REFLEX): HIV SCREEN 4TH GENERATION: NONREACTIVE

## 2016-01-16 ENCOUNTER — Encounter: Payer: Self-pay | Admitting: *Deleted

## 2016-01-22 ENCOUNTER — Ambulatory Visit (INDEPENDENT_AMBULATORY_CARE_PROVIDER_SITE_OTHER): Payer: Medicaid Other | Admitting: Certified Nurse Midwife

## 2016-01-22 DIAGNOSIS — Z3481 Encounter for supervision of other normal pregnancy, first trimester: Secondary | ICD-10-CM

## 2016-01-22 NOTE — Progress Notes (Signed)
Subjective:    Katelyn Bauer is a 25 y.o. female being seen today for her obstetrical visit. She is at 5759w6d gestation. Patient reports no complaints. Fetal movement: normal.  Problem List Items Addressed This Visit      Other   Encounter for supervision of other normal pregnancy in first trimester    Other Visit Diagnoses   None.    Patient Active Problem List   Diagnosis Date Noted  . H/O gestational diabetes in prior pregnancy, currently pregnant 08/24/2015  . Encounter for supervision of other normal pregnancy in first trimester 07/26/2015   Objective:    BP 108/69   Pulse 94   Temp 98.9 F (37.2 C)   Wt 171 lb (77.6 kg)   LMP 06/20/2015 (LMP Unknown)   BMI 28.46 kg/m  FHT:  140 BPM  Uterine Size: 31 cm and size equals dates  Presentation: cephalic     Assessment:    Pregnancy @ 2259w6d weeks   Plan:     labs reviewed, problem list updated Consent signed. GBS planning TDAP given previously Rhogam given for [facility] negative Pediatrician: discussed. Infant feeding: plans to breastfeed. Maternity leave: discussed. Cigarette smoking: never smoked. No orders of the defined types were placed in this encounter.  No orders of the defined types were placed in this encounter.  Follow up in 2 Weeks.

## 2016-01-30 ENCOUNTER — Inpatient Hospital Stay (HOSPITAL_COMMUNITY)
Admission: AD | Admit: 2016-01-30 | Discharge: 2016-01-30 | Disposition: A | Discharge status: Home / Self Care | Payer: Medicaid Other | Source: Ambulatory Visit | Attending: Family Medicine | Admitting: Family Medicine

## 2016-01-30 ENCOUNTER — Encounter (HOSPITAL_COMMUNITY): Payer: Self-pay

## 2016-01-30 DIAGNOSIS — R109 Unspecified abdominal pain: Secondary | ICD-10-CM | POA: Diagnosis not present

## 2016-01-30 DIAGNOSIS — Z3A32 32 weeks gestation of pregnancy: Secondary | ICD-10-CM | POA: Diagnosis not present

## 2016-01-30 DIAGNOSIS — O26899 Other specified pregnancy related conditions, unspecified trimester: Secondary | ICD-10-CM

## 2016-01-30 DIAGNOSIS — O26893 Other specified pregnancy related conditions, third trimester: Secondary | ICD-10-CM | POA: Diagnosis not present

## 2016-01-30 DIAGNOSIS — Z880 Allergy status to penicillin: Secondary | ICD-10-CM | POA: Insufficient documentation

## 2016-01-30 DIAGNOSIS — Z87891 Personal history of nicotine dependence: Secondary | ICD-10-CM | POA: Diagnosis not present

## 2016-01-30 DIAGNOSIS — R1030 Lower abdominal pain, unspecified: Secondary | ICD-10-CM | POA: Insufficient documentation

## 2016-01-30 DIAGNOSIS — Z3689 Encounter for other specified antenatal screening: Secondary | ICD-10-CM

## 2016-01-30 HISTORY — DX: Other specified health status: Z78.9

## 2016-01-30 LAB — URINALYSIS, ROUTINE W REFLEX MICROSCOPIC
BILIRUBIN URINE: NEGATIVE
GLUCOSE, UA: NEGATIVE mg/dL
HGB URINE DIPSTICK: NEGATIVE
Ketones, ur: NEGATIVE mg/dL
Leukocytes, UA: NEGATIVE
Nitrite: NEGATIVE
PROTEIN: NEGATIVE mg/dL
SPECIFIC GRAVITY, URINE: 1.01 (ref 1.005–1.030)
pH: 7.5 (ref 5.0–8.0)

## 2016-01-30 LAB — WET PREP, GENITAL
Clue Cells Wet Prep HPF POC: NONE SEEN
Sperm: NONE SEEN
TRICH WET PREP: NONE SEEN
YEAST WET PREP: NONE SEEN

## 2016-01-30 NOTE — MAU Note (Signed)
Having a lot of pain in lower stomach and low back.  Started over the weekend. Denies hx of PTL.  Denies urinary or GI complaints.

## 2016-01-30 NOTE — MAU Provider Note (Signed)
History     CSN: [id number]  Arrival date and time: 01/30/16 1340   None     Chief Complaint  Patient presents with  . Abdominal Pain  . Back Pain   G2P1001 @32  weeks here with c/o intermittent lower abdominal cramping and pressure. Describes feeling like ctx, occurs once every few hours. Symptoms started 2-3 days ago. She denies VB, vaginal discharge, and LOF. She reports good FM. No recent IC. She admits to poor hydration today.     OB History    Gravida Para Term Preterm AB Living   2 1 1  0 0 1   SAB TAB Ectopic Multiple Live Births   0 0 0 0 1      Obstetric Comments   [place]  History of Twins runs in the family. Patient is a twin And her father is a twin.      Past Medical History:  Diagnosis Date  . Medical history non-contributory     Past Surgical History:  Procedure Laterality Date  . NO PAST SURGERIES      Family History  Problem Relation Age of Onset  . Lung cancer Paternal Grandmother     Social History  Substance Use Topics  . Smoking status: Former Games developermoker  . Smokeless tobacco: Never Used     Comment: Positive Pregnancy Test Plans to Quit Today 07/26/15  . Alcohol use 0.0 oz/week     Comment: Occasional    Allergies:  Allergies  Allergen Reactions  . Penicillins Swelling    Has patient had a PCN reaction causing immediate rash, facial/tongue/throat swelling, SOB or lightheadedness with hypotension: Yes Has patient had a PCN reaction causing severe rash involving mucus membranes or skin necrosis: No Has patient had a PCN reaction that required hospitalization Yes Has patient had a PCN reaction occurring within the last 10 years: No If all of the above answers are "NO", then may proceed with Cephalosporin use.      Prescriptions Prior to Admission  Medication Sig Dispense Refill Last Dose  . Prenatal Vit-Fe Phos-FA-Omega (VITAFOL GUMMIES) 3.33-0.333-34.8 MG CHEW Chew 3 tablets by mouth daily. 90 tablet 12 01/30/2016 at  Unknown time  . Iron Polysacch Cmplx-B12-FA 150-0.025-1 MG CAPS Take 1 tablet by mouth daily. (Patient not taking: Reported on 01/22/2016) 30 each 4 Not Taking  . ondansetron (ZOFRAN) 4 MG tablet Take 1 tablet (4 mg total) by mouth daily as needed. (Patient not taking: Reported on 01/22/2016) 30 tablet 1 Not Taking    Review of Systems  Constitutional: Negative.   Gastrointestinal: Positive for abdominal pain.  Genitourinary: Negative.    Physical Exam   Blood pressure 108/58, pulse 78, temperature 98.5 F (36.9 C), temperature source Oral, resp. rate 16, height 5\' 5"  (1.651 m), weight 78.8 kg (173 lb 12.8 oz), last menstrual period 06/20/2015.  Physical Exam  Constitutional: She is oriented to person, place, and time. She appears well-developed and well-nourished. No distress.  HENT:  Head: Normocephalic and atraumatic.  Neck: Normal range of motion. Neck supple.  Cardiovascular: Normal rate.   Respiratory: Effort normal.  GI: Soft. She exhibits no distension. There is no tenderness.  gravid  Genitourinary:  Genitourinary Comments: External: no lesions Vagina: rugated, parous, thin yellow discharge SVE: closed/thick   Musculoskeletal: Normal range of motion.  Neurological: She is alert and oriented to person, place, and time.  Skin: Skin is warm and dry.  Psychiatric: She has a normal mood and affect.   EFM: 135 bpm,  mod variability, + accels, no decels Toco: none Results for orders placed or performed during the hospital encounter of 01/30/16 (from the past 24 hour(s))  Urinalysis, Routine w reflex microscopic ([facility])     Status: Abnormal   Collection Time: 01/30/16  1:50 PM  Result Value Ref Range   Color, Urine YELLOW YELLOW   APPearance HAZY (A) CLEAR   Specific Gravity, Urine 1.010 1.005 - 1.030   pH 7.5 5.0 - 8.0   Glucose, UA NEGATIVE NEGATIVE mg/dL   Hgb urine dipstick NEGATIVE NEGATIVE   Bilirubin Urine NEGATIVE NEGATIVE   Ketones, ur NEGATIVE NEGATIVE  mg/dL   Protein, ur NEGATIVE NEGATIVE mg/dL   Nitrite NEGATIVE NEGATIVE   Leukocytes, UA NEGATIVE NEGATIVE  Wet prep, genital     Status: Abnormal   Collection Time: 01/30/16  2:40 PM  Result Value Ref Range   Yeast Wet Prep HPF POC NONE SEEN NONE SEEN   Trich, Wet Prep NONE SEEN NONE SEEN   Clue Cells Wet Prep HPF POC NONE SEEN NONE SEEN   WBC, Wet Prep HPF POC MANY (A) NONE SEEN   Sperm NONE SEEN     MAU Course  Procedures  MDM Labs ordered and reviewed. No evidence of UTI or PTL. GC/CMT pending. Stable for discharge home.   Assessment and Plan   1. NST (non-stress test) reactive   2. Abdominal cramping affecting pregnancy    Discharge home PTL precautions Maintain hydration [facility] [place]    Medication List    STOP taking these medications   ondansetron 4 MG tablet Commonly known as:  ZOFRAN     TAKE these medications   Iron Polysacch Cmplx-B12-FA 150-0.025-1 MG Caps Take 1 tablet by mouth daily.   VITAFOL GUMMIES 3.33-0.333-34.8 MG Chew Chew 3 tablets by mouth daily.       Donette LarryMelanie Quintyn Dombek, CNM 01/30/2016, 2:34 PM

## 2016-01-30 NOTE — Discharge Instructions (Signed)
Pregnancy and Anemia Anemia is a condition in which the concentration of red blood cells or hemoglobin in the blood is below normal. Hemoglobin is a substance in red blood cells that carries oxygen to the tissues of the body. Anemia results in not enough oxygen reaching these tissues.  Anemia during pregnancy is common because the fetus uses more iron and folic acid as it is developing. Your body may not produce enough red blood cells because of this. Also, during pregnancy, the liquid part of the blood (plasma) increases by about 50%, and the red blood cells increase by only 25%. This lowers the concentration of the red blood cells and creates a natural anemia-like situation.  CAUSES  The most common cause of anemia during pregnancy is not having enough iron in the body to make red blood cells (iron deficiency anemia). Other causes may include:  Folic acid deficiency.  Vitamin B12 deficiency.  Certain prescription or over-the-counter medicines.  Certain medical conditions or infections that destroy red blood cells.  A low platelet count and bleeding caused by antibodies that go through the placenta to the fetus from the mother's blood. SIGNS AND SYMPTOMS  Mild anemia may not be noticeable. If it becomes severe, symptoms may include:  Tiredness.  Shortness of breath, especially with exercise.  Weakness.  Fainting.  Pale looking skin.  Headaches.  Feeling a fast or irregular heartbeat (palpitations). DIAGNOSIS  The type of anemia is usually diagnosed from your family and medical history and blood tests. TREATMENT  Treatment of anemia during pregnancy depends on the cause of the anemia. Treatment can include:  Supplements of iron, vitamin B12, or folic acid.  A blood transfusion. This may be needed if blood loss is severe.  Hospitalization. This may be needed if there is significant continual blood loss.  Dietary changes. HOME CARE INSTRUCTIONS   Follow your dietitian's or  health care provider's dietary recommendations.  Increase your vitamin C intake. This will help the stomach absorb more iron.  Eat a diet rich in iron. This would include foods such as:  Liver.  Beef.  Whole grain bread.  Eggs.  Dried fruit.  Take iron and vitamins as directed by your health care provider.  Eat green leafy vegetables. These are a good source of folic acid. SEEK MEDICAL CARE IF:   You have frequent or lasting headaches.  You are looking pale.  You are bruising easily. SEEK IMMEDIATE MEDICAL CARE IF:   You have extreme weakness, shortness of breath, or chest pain.  You become dizzy or have trouble concentrating.  You have heavy vaginal bleeding.  You develop a rash.  You have bloody or black, tarry stools.  You faint.  You vomit up blood.  You vomit repeatedly.  You have abdominal pain.  You have a fever or persistent symptoms for more than 2-3 days.  You have a fever and your symptoms suddenly get worse.  You are dehydrated. MAKE SURE YOU:   Understand these instructions.  Will watch your condition.  Will get help right away if you are not doing well or get worse.   This information is not intended to replace advice given to you by your health care provider. Make sure you discuss any questions you have with your health care provider.   Document Released: 03/08/2000 Document Revised: 12/30/2012 Document Reviewed: 10/21/2012 Elsevier Interactive Patient Education 2016 [employer] Inc. Ball CorporationBraxton Hicks Contractions Contractions of the uterus can occur throughout pregnancy. Contractions are not always a sign that you  are in labor.  WHAT ARE BRAXTON HICKS CONTRACTIONS?  Contractions that occur before labor are called Braxton Hicks contractions, or false labor. Toward the end of pregnancy (32-34 weeks), these contractions can develop more often and may become more forceful. This is not true labor because these contractions do not result in  opening (dilatation) and thinning of the cervix. They are sometimes difficult to tell apart from true labor because these contractions can be forceful and people have different pain tolerances. You should not feel embarrassed if you go to the hospital with false labor. Sometimes, the only way to tell if you are in true labor is for your health care provider to look for changes in the cervix. If there are no prenatal problems or other health problems associated with the pregnancy, it is completely safe to be sent home with false labor and await the onset of true labor. HOW CAN YOU TELL THE DIFFERENCE BETWEEN TRUE AND FALSE LABOR? False Labor  The contractions of false labor are usually shorter and not as hard as those of true labor.   The contractions are usually irregular.   The contractions are often felt in the front of the lower abdomen and in the groin.   The contractions may go away when you walk around or change positions while lying down.   The contractions get weaker and are shorter lasting as time goes on.   The contractions do not usually become progressively stronger, regular, and closer together as with true labor.  True Labor  Contractions in true labor last 30-70 seconds, become very regular, usually become more intense, and increase in frequency.   The contractions do not go away with walking.   The discomfort is usually felt in the top of the uterus and spreads to the lower abdomen and low back.   True labor can be determined by your health care provider with an exam. This will show that the cervix is dilating and getting thinner.  WHAT TO REMEMBER  Keep up with your usual exercises and follow other instructions given by your health care provider.   Take medicines as directed by your health care provider.   Keep your regular prenatal appointments.   Eat and drink lightly if you think you are going into labor.   If Braxton Hicks contractions are making  you uncomfortable:   Change your position from lying down or resting to walking, or from walking to resting.   Sit and rest in a tub of warm water.   Drink 2-3 glasses of water. Dehydration may cause these contractions.   Do slow and deep breathing several times an hour.  WHEN SHOULD I SEEK IMMEDIATE MEDICAL CARE? Seek immediate medical care if:  Your contractions become stronger, more regular, and closer together.   You have fluid leaking or gushing from your vagina.   You have a fever.   You pass blood-tinged mucus.   You have vaginal bleeding.   You have continuous abdominal pain.   You have low back pain that you never had before.   You feel your baby's head pushing down and causing pelvic pressure.   Your baby is not moving as much as it used to.    This information is not intended to replace advice given to you by your health care provider. Make sure you discuss any questions you have with your health care provider.   Document Released: 03/11/2005 Document Revised: 03/16/2013 Document Reviewed: 12/21/2012 Elsevier Interactive Patient Education 2016 Elsevier  Inc[employer] ° °

## 2016-01-31 LAB — GC/CHLAMYDIA PROBE AMP (~~LOC~~) NOT AT ARMC
CHLAMYDIA, DNA PROBE: NEGATIVE
Neisseria Gonorrhea: NEGATIVE

## 2016-02-05 ENCOUNTER — Encounter: Payer: Medicaid Other | Admitting: Certified Nurse Midwife

## 2016-02-08 ENCOUNTER — Ambulatory Visit (INDEPENDENT_AMBULATORY_CARE_PROVIDER_SITE_OTHER): Payer: Medicaid Other | Admitting: Certified Nurse Midwife

## 2016-02-08 ENCOUNTER — Ambulatory Visit (INDEPENDENT_AMBULATORY_CARE_PROVIDER_SITE_OTHER): Payer: Medicaid Other

## 2016-02-08 VITALS — BP 110/70 | HR 88 | Wt 176.0 lb

## 2016-02-08 DIAGNOSIS — O403XX0 Polyhydramnios, third trimester, not applicable or unspecified: Secondary | ICD-10-CM

## 2016-02-08 DIAGNOSIS — Z3483 Encounter for supervision of other normal pregnancy, third trimester: Secondary | ICD-10-CM | POA: Diagnosis not present

## 2016-02-08 DIAGNOSIS — O24419 Gestational diabetes mellitus in pregnancy, unspecified control: Secondary | ICD-10-CM

## 2016-02-08 IMAGING — US US OB FOLLOW-UP
1 series · 14 of 15 positions shown · non-contrast
Comparison: none

[Series 1: us ob follow-up · 0.26mm/px · 14 of 15 slices shown]
[im 1/15]
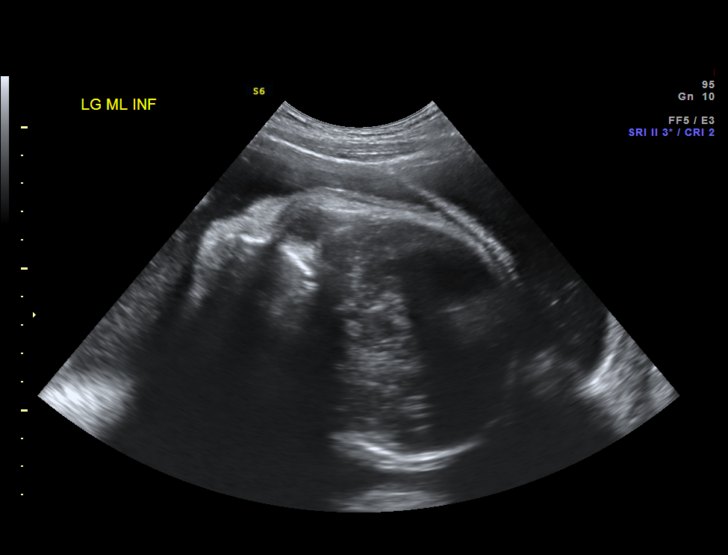
[im 2/15]
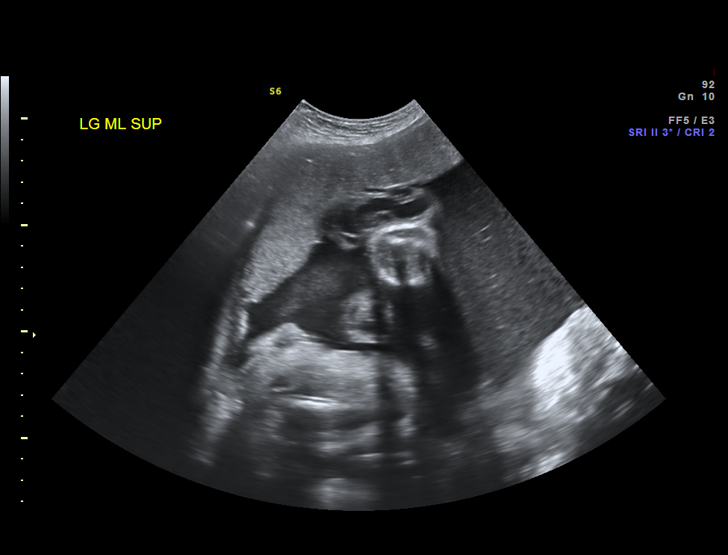
[im 3/15]
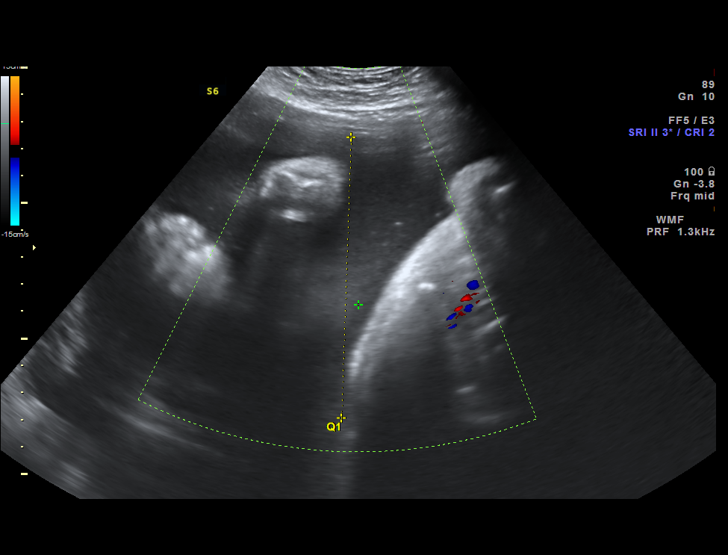
[im 4/15]
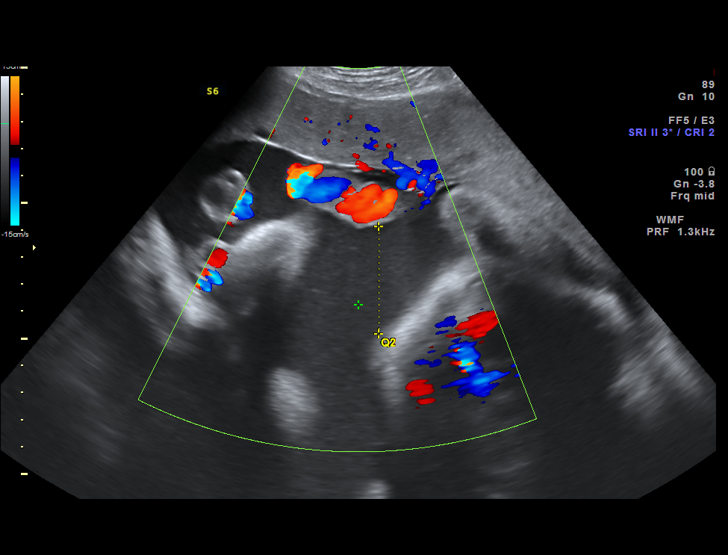
[im 5/15]
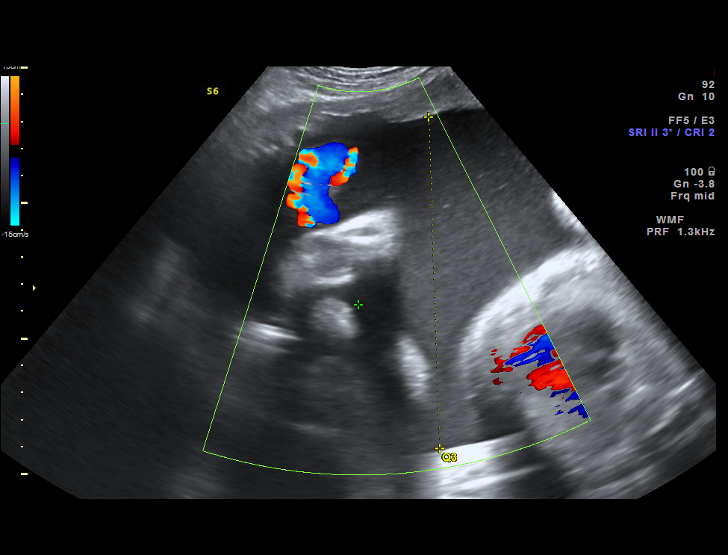
[im 6/15]
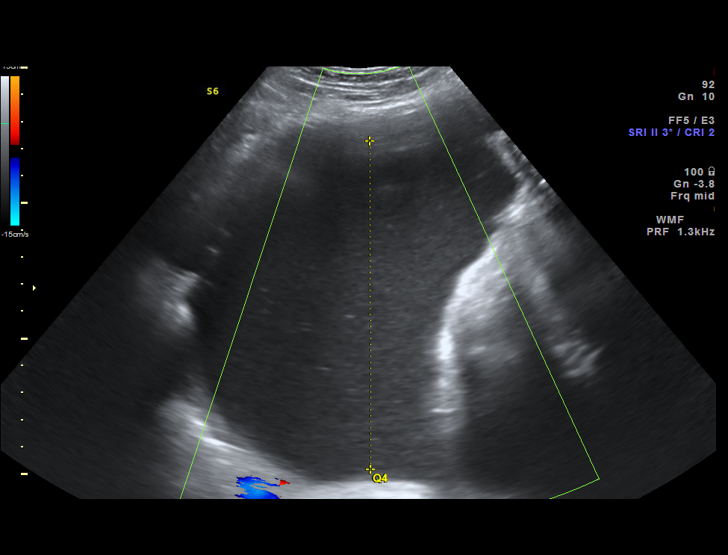
[im 7/15]
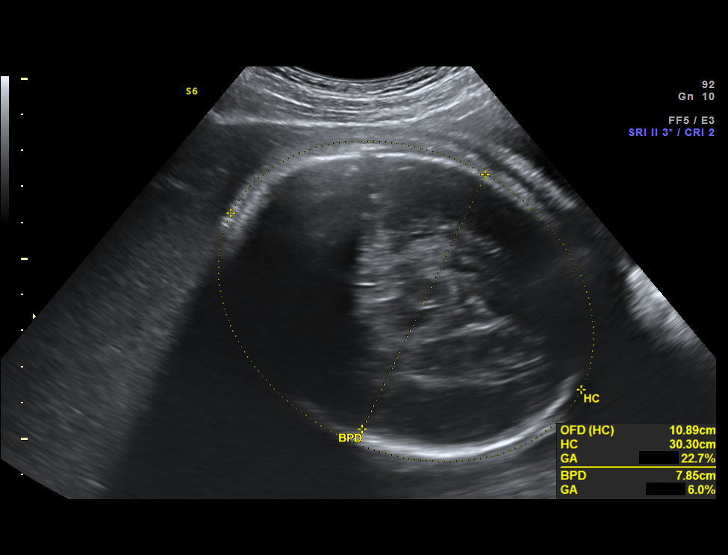
[im 9/15]
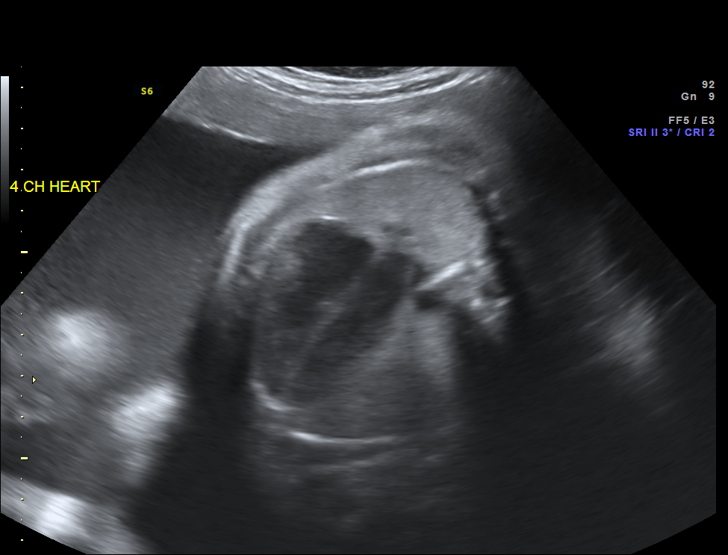
[im 10/15]
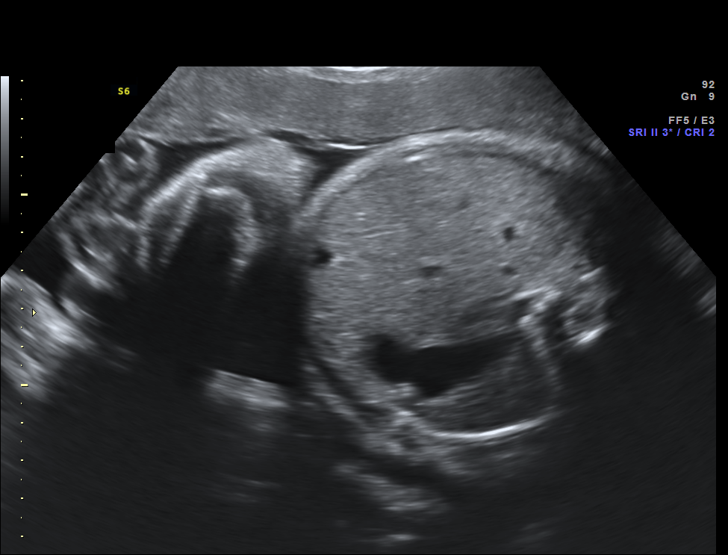
[im 11/15]
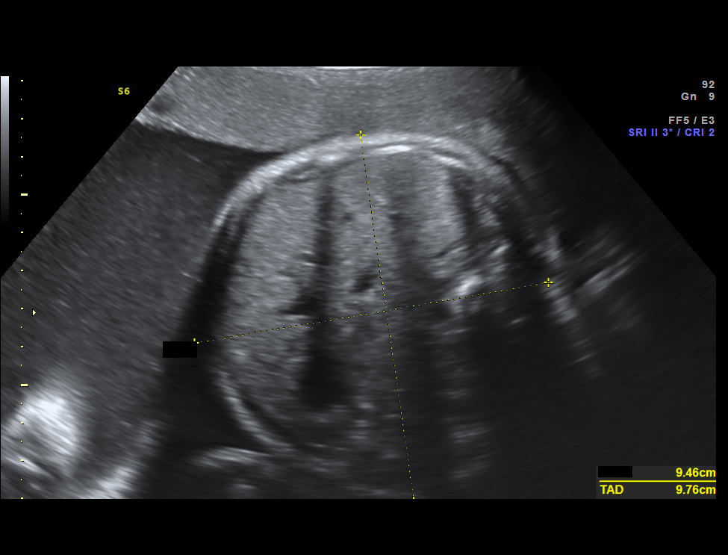
[im 12/15]
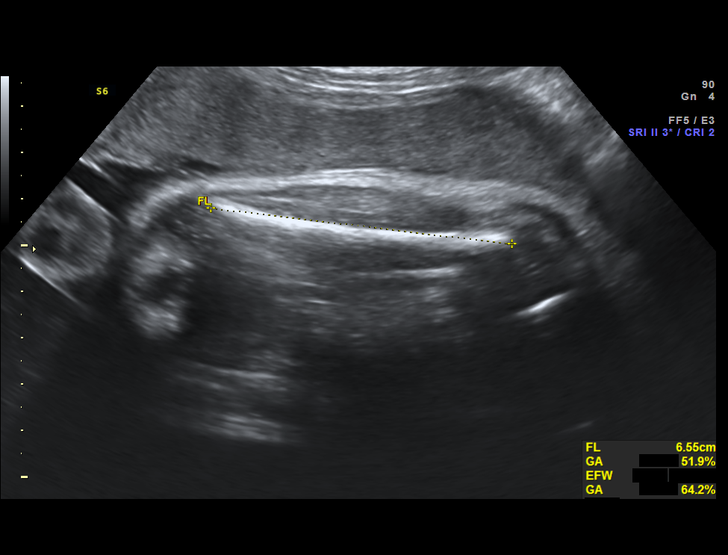
[im 13/15]
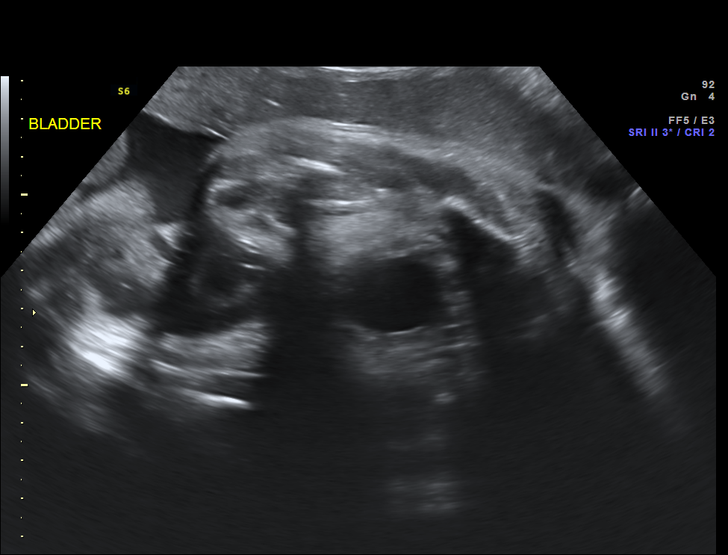
[im 14/15]
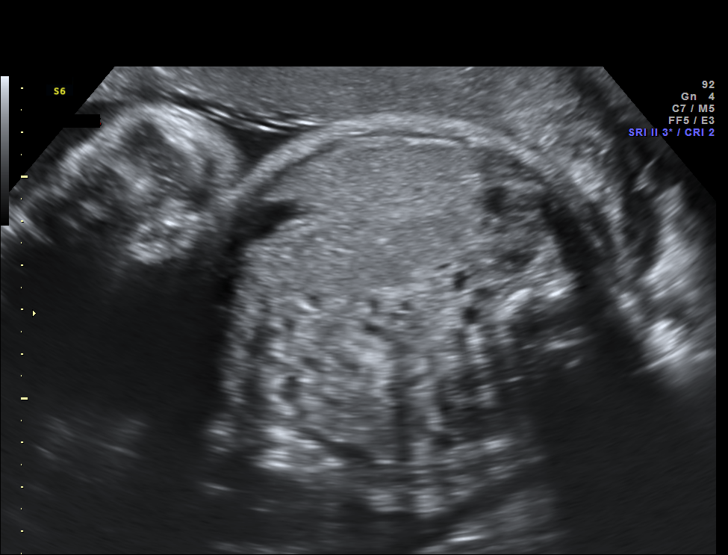
[im 15/15]
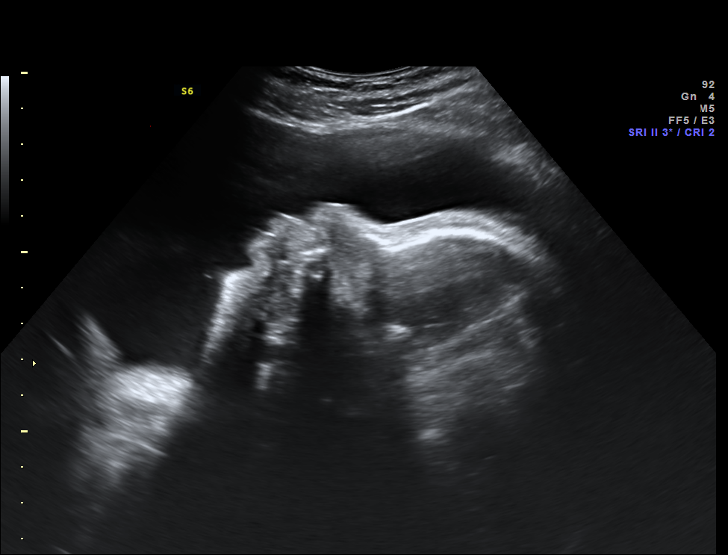

[14 of 15 positions shown; findings below may reference images not displayed]

Canned report from images found in remote index.

Refer to host system for actual result text.

## 2016-02-08 NOTE — Progress Notes (Signed)
Subjective:    Lance BoschLeona Neria is a 25 y.o. female being seen today for her obstetrical visit. She is at 8773w2d gestation. Patient reports backache, no bleeding, no contractions, no cramping and no leaking. Fetal movement: normal.  MAU note reviewed from 01/30/16.    Problem List Items Addressed This Visit    None    Visit Diagnoses    Encounter for supervision of other normal pregnancy, third trimester    -  Primary   Relevant Orders   US OB Follow Up   Polyhydramnios in third trimester complication, single or unspecified fetus       Relevant Orders   AMB referral to maternal fetal medicine   US MFM OB DETAIL +14 WK   Amb Referral to Nutrition and Diabetic E   HgB A1c   Gestational diabetes mellitus (GDM) in third trimester, gestational diabetes method of control unspecified       Relevant Orders   AMB referral to maternal fetal medicine   US MFM OB DETAIL +14 WK   Amb Referral to Nutrition and Diabetic E   HgB A1c     Patient Active Problem List   Diagnosis Date Noted  . H/O gestational diabetes in prior pregnancy, currently pregnant 08/24/2015  . Encounter for supervision of other normal pregnancy in first trimester 07/26/2015   Objective:    BP 110/70   Pulse 88   Wt 176 lb (79.8 kg)   LMP 06/20/2015 (LMP Unknown)   BMI 29.29 kg/m  FHT:  135 BPM  Uterine Size: 35 cm and size greater than dates  Presentation: cephalic and by US     Assessment:    Pregnancy @ 6073w2d weeks   Normal pregnancy discomforts  Polyhydramnios  Plan:    U/S here to confirm fetal position/growth   US @ MFM stat, consult with MFM   Weekly NST's   HGBA1C done   labs reviewed, problem list updated Consent signed. GBS planning TDAP offered  Rhogam given for RH negative Pediatrician: discussed. Infant feeding: plans to breastfeed. Maternity leave: discussed. Cigarette smoking: never smoked. Orders Placed This Encounter  Procedures  . US OB Follow Up    Standing Status:   Future   Standing Expiration Date:   04/09/2017    Order Specific Question:   Reason for Exam (SYMPTOM  OR DIAGNOSIS REQUIRED)    Answer:   growth, fetal position    Order Specific Question:   Preferred imaging location?    Answer:   Internal  . US MFM OB DETAIL +14 WK    Standing Status:   Future    Standing Expiration Date:   04/09/2017    Order Specific Question:   Reason for Exam (SYMPTOM  OR DIAGNOSIS REQUIRED)    Answer:   polyhydramnios    Order Specific Question:   Preferred imaging location?    Answer:   [facility]  . HgB A1c  . AMB referral to maternal fetal medicine    Referral Priority:   Urgent    Referral Type:   Consultation    Referral Reason:   Specialty Services Required    Number of Visits Requested:   1  . Amb Referral to Nutrition and Diabetic E    Referral Priority:   Routine    Referral Type:   Consultation    Referral Reason:   Specialty Services Required    Number of Visits Requested:   1   No orders of the defined types were placed in this  encounter.  Follow up in 1 Weeks with NST.

## 2016-02-09 ENCOUNTER — Other Ambulatory Visit (HOSPITAL_COMMUNITY): Payer: Self-pay | Admitting: *Deleted

## 2016-02-09 ENCOUNTER — Ambulatory Visit (HOSPITAL_COMMUNITY)
Admission: RE | Admit: 2016-02-09 | Discharge: 2016-02-09 | Disposition: A | Discharge status: Home / Self Care | Payer: Medicaid Other | Source: Ambulatory Visit | Attending: Certified Nurse Midwife | Admitting: Certified Nurse Midwife

## 2016-02-09 ENCOUNTER — Other Ambulatory Visit: Payer: Self-pay | Admitting: Certified Nurse Midwife

## 2016-02-09 ENCOUNTER — Ambulatory Visit (HOSPITAL_COMMUNITY): Payer: Medicaid Other

## 2016-02-09 ENCOUNTER — Encounter (HOSPITAL_COMMUNITY): Payer: Self-pay

## 2016-02-09 DIAGNOSIS — O403XX0 Polyhydramnios, third trimester, not applicable or unspecified: Secondary | ICD-10-CM

## 2016-02-09 DIAGNOSIS — O09299 Supervision of pregnancy with other poor reproductive or obstetric history, unspecified trimester: Secondary | ICD-10-CM

## 2016-02-09 DIAGNOSIS — Z3A33 33 weeks gestation of pregnancy: Secondary | ICD-10-CM | POA: Diagnosis not present

## 2016-02-09 DIAGNOSIS — Z8632 Personal history of gestational diabetes: Secondary | ICD-10-CM

## 2016-02-09 DIAGNOSIS — O09293 Supervision of pregnancy with other poor reproductive or obstetric history, third trimester: Secondary | ICD-10-CM | POA: Diagnosis not present

## 2016-02-09 DIAGNOSIS — O409XX0 Polyhydramnios, unspecified trimester, not applicable or unspecified: Secondary | ICD-10-CM

## 2016-02-09 DIAGNOSIS — O24419 Gestational diabetes mellitus in pregnancy, unspecified control: Secondary | ICD-10-CM

## 2016-02-09 LAB — HEMOGLOBIN A1C
ESTIMATED AVERAGE GLUCOSE: 114 mg/dL
HEMOGLOBIN A1C: 5.6 % (ref 4.8–5.6)

## 2016-02-09 IMAGING — US US MFM OB DETAIL+14 WK
1 series · 14 of 28 positions shown · non-contrast
Comparison: none

[Series 1: us mfm ob detail+14 wk · 14 of 77 slices shown]
[im 3/77]
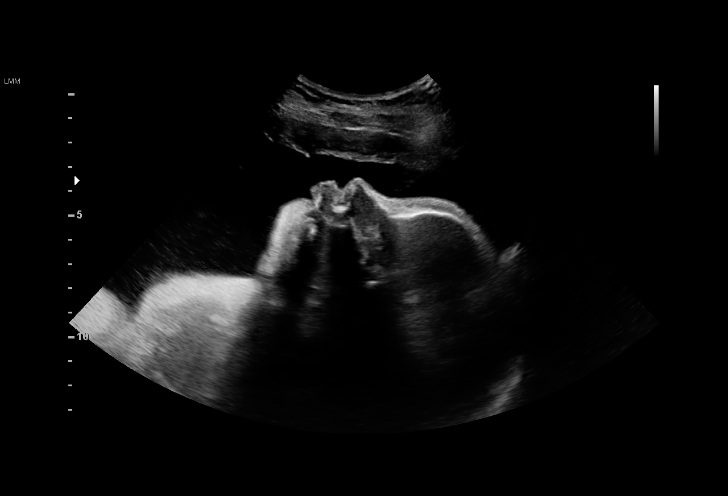
[im 9/77]
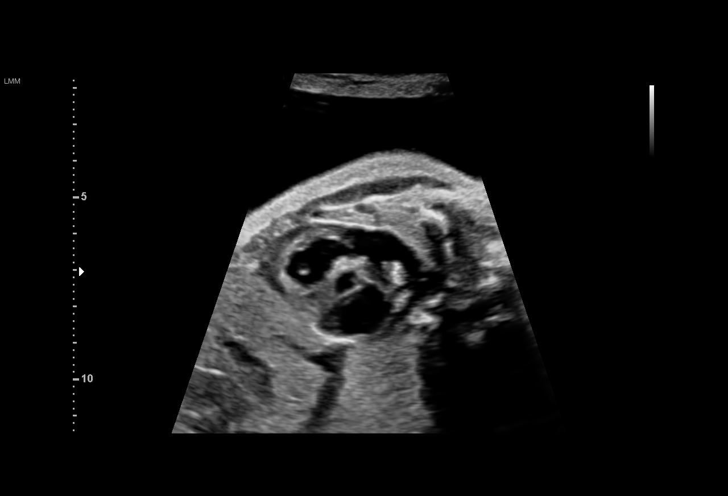
[im 15/77]
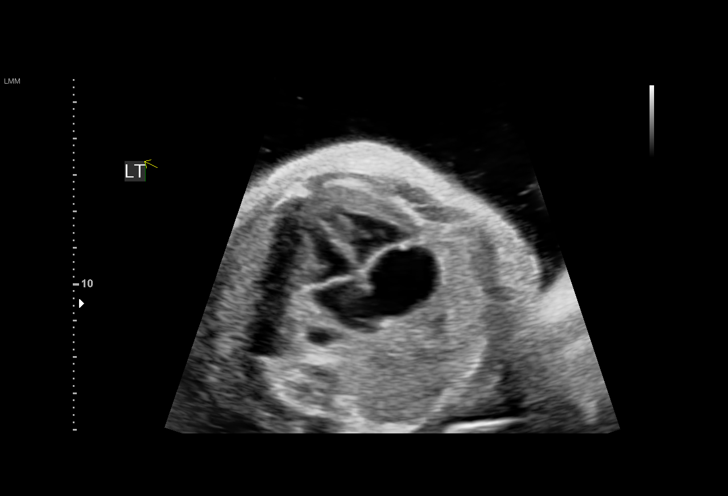
[im 20/77]
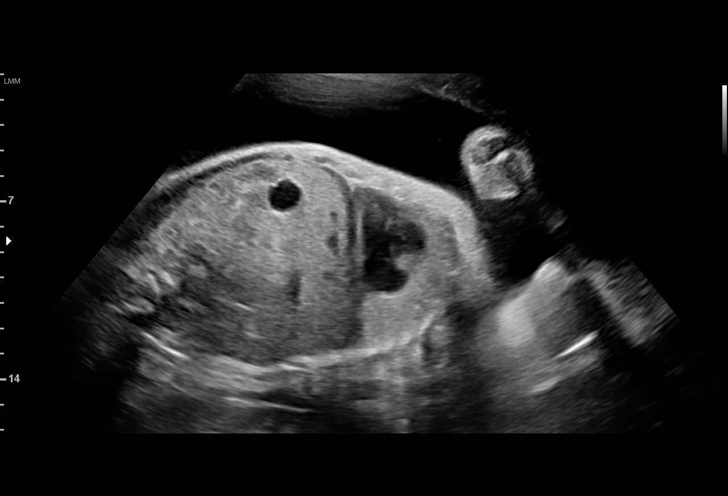
[im 26/77]
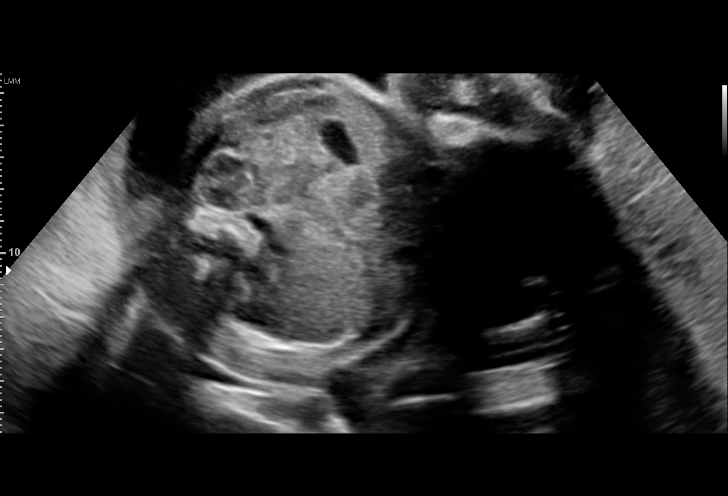
[im 31/77]
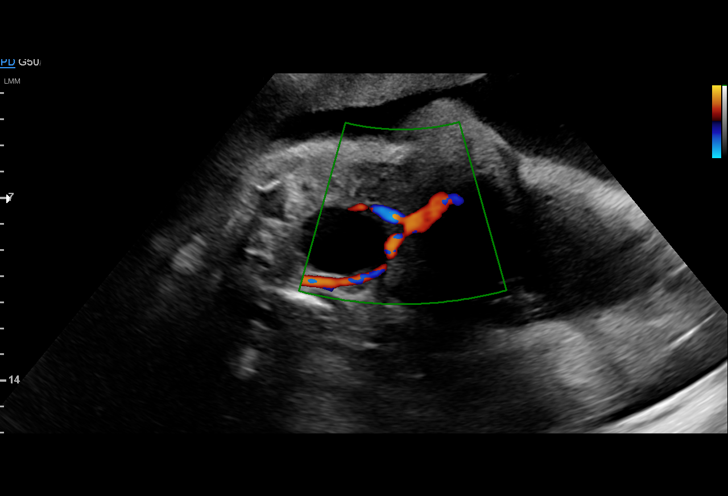
[im 37/77]
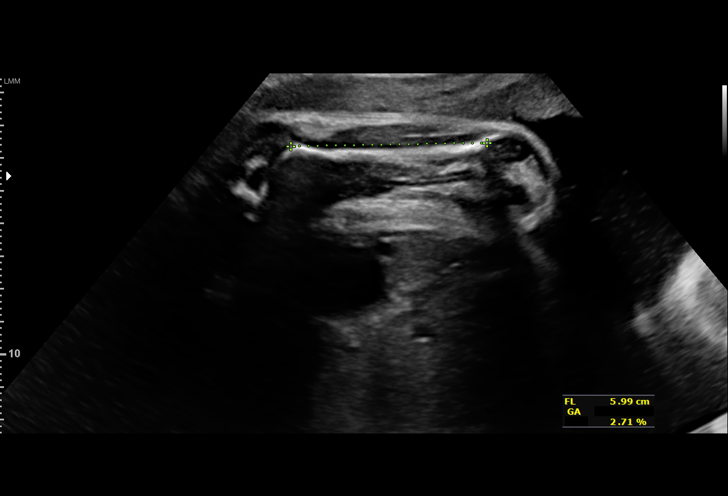
[im 43/77]
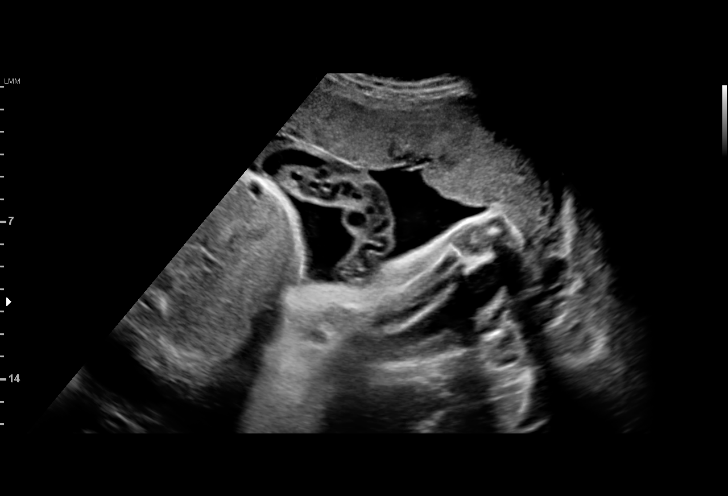
[im 48/77]
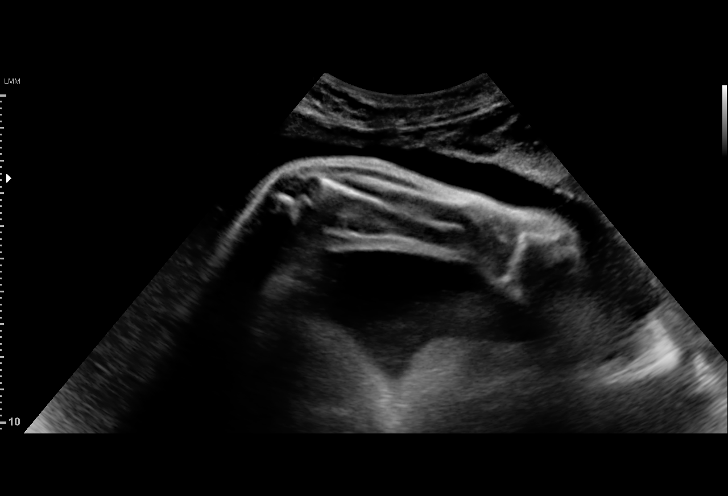
[im 54/77]
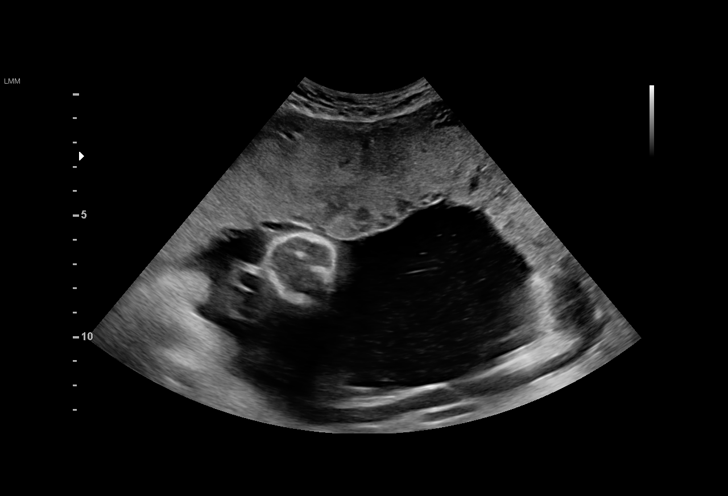
[im 60/77]
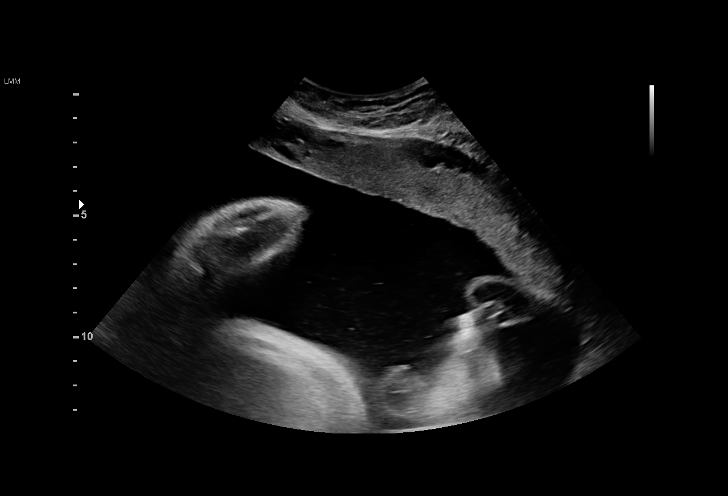
[im 65/77]
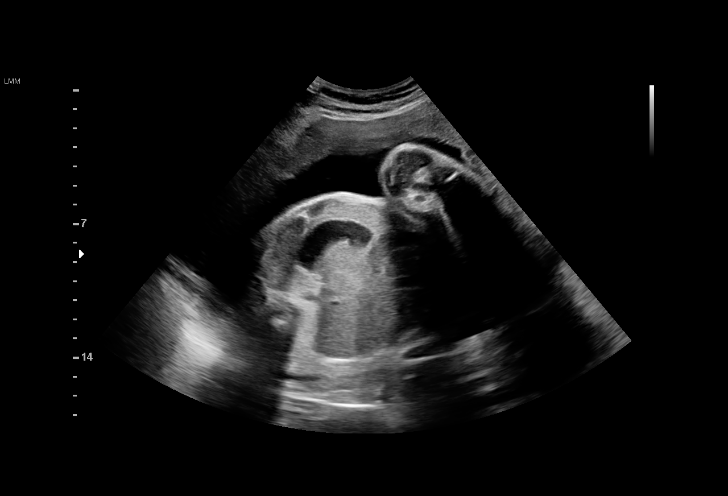
[im 71/77]
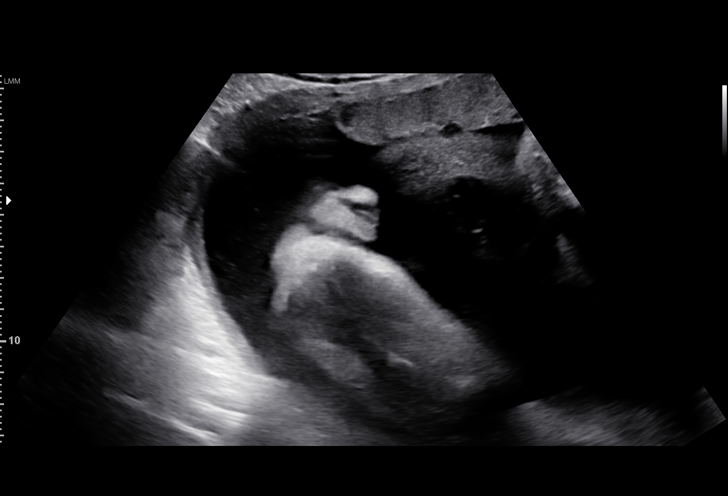
[im 77/77]
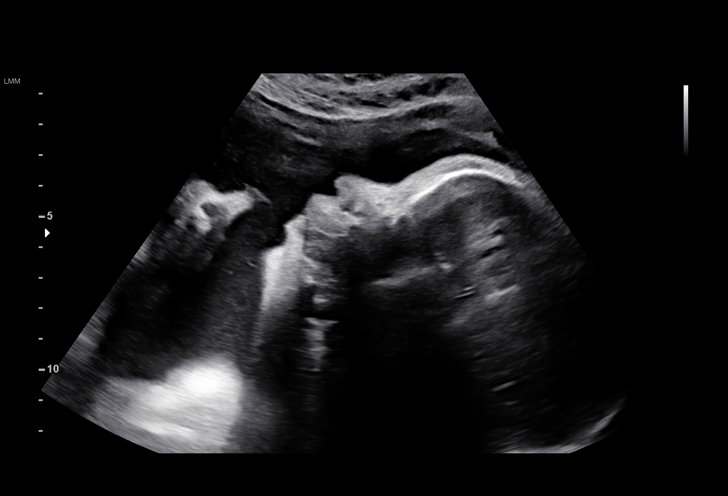

[14 of 28 positions shown; findings below may reference images not displayed]

Road [HOSPITAL]

Indications

33 weeks gestation of pregnancy
Polyhydramnios, third trimester, antepartum    [ND]
condition or complication, fetus 1
Poor obstetric history: Previous gestational   [ND]
diabetes (diet-controlled)
OB History

Gravidity:    2         Term:   1
Living:       1
Fetal Evaluation

Num Of Fetuses:     1
Fetal Heart         129
Rate(bpm):
Cardiac Activity:   Observed
Presentation:       Cephalic
Placenta:           Anterior Fundal, above cervical os
P. Cord Insertion:  Visualized, central

Amniotic Fluid
AFI FV:      Polyhydramnios

AFI Sum(cm)     %Tile       Largest Pocket(cm)
29.77           > 97

RUQ(cm)       RLQ(cm)       LUQ(cm)        LLQ(cm)
8.91
Biophysical Evaluation

Amniotic F.V:   Pocket => 2 cm two         F. Tone:        Observed
planes
F. Movement:    Observed                   Score:          [DATE]
F. Breathing:   Observed
Biometry

BPD:      80.7  mm     G. Age:  32w 3d         18  %    CI:         71.3   %   70 - 86
FL/HC:      19.9   %   19.9 -
HC:      304.4  mm     G. Age:  33w 6d         24  %    HC/AC:      1.04       0.96 -
AC:      293.2  mm     G. Age:  33w 2d         49  %    FL/BPD:     75.2   %   71 - 87
FL:       60.7  mm     G. Age:  31w 4d          6  %    FL/AC:      20.7   %   20 - 24
HUM:      55.6  mm     G. Age:  32w 3d         36  %

Est. FW:    [ND]  gm      4 lb 8 oz     46  %
Gestational Age

LMP:           33w 3d       Date:   [DATE]                 EDD:   [DATE]
U/S Today:     32w 6d                                        EDD:   [DATE]
Best:          33w 3d    Det. By:   LMP  ([DATE])          EDD:   [DATE]
Anatomy

Cranium:               Appears normal         Aortic Arch:            Appears normal
Cavum:                 Appears normal         Ductal Arch:            Appears normal
Ventricles:            Appears normal         Diaphragm:              Appears normal
Choroid Plexus:        Not well visualized    Stomach:                Appears normal, left
sided
Cerebellum:            Not well visualized    Abdomen:                Appears normal
Posterior Fossa:       Not well visualized    Abdominal Wall:         Not well visualized
Nuchal Fold:           Not applicable (>20    Cord Vessels:           Appears normal (3
wks GA)                                        vessel cord)
Face:                  Appears normal         Kidneys:                Appear normal
(orbits and profile)
Lips:                  Appears normal         Bladder:                Appears normal
Thoracic:              Appears normal         Spine:                  Not well visualized
Heart:                 Appears normal         Upper Extremities:      Visualized
(4CH, axis, and situs
RVOT:                  Appears normal         Lower Extremities:      Visualized
LVOT:                  Appears normal

Other:  Fetus appears to be a female. Technically difficult due to advanced
GA and fetal position.
Cervix Uterus Adnexa

Cervix
Not visualized (advanced GA >[ND])

Uterus
No abnormality visualized.

Left Ovary
Not visualized.

Right Ovary
Not visualized.
Adnexa:       No abnormality visualized.
Impression

Single IUP at 33w 3d
Limited views of the cranial anatomy and spine were obtained
due to fetal position
The remainder of the fetal anatomy appears normal
A normal stomach bubble was appreciated
The estimated fetal weight is at the 46th %tile.
BPP [DATE]
Polyhydramnios noted (AFI 27.9 cm)
Recommendations

Recommend weekly BPPs
Delivery at 39 weeks in the absence of other complications

## 2016-02-16 ENCOUNTER — Encounter (HOSPITAL_COMMUNITY): Payer: Self-pay

## 2016-02-16 ENCOUNTER — Ambulatory Visit (HOSPITAL_COMMUNITY)
Admission: RE | Admit: 2016-02-16 | Discharge: 2016-02-16 | Disposition: A | Discharge status: Home / Self Care | Payer: Medicaid Other | Source: Ambulatory Visit | Attending: Certified Nurse Midwife | Admitting: Certified Nurse Midwife

## 2016-02-16 DIAGNOSIS — Z3A34 34 weeks gestation of pregnancy: Secondary | ICD-10-CM | POA: Insufficient documentation

## 2016-02-16 DIAGNOSIS — O403XX0 Polyhydramnios, third trimester, not applicable or unspecified: Secondary | ICD-10-CM | POA: Insufficient documentation

## 2016-02-16 DIAGNOSIS — O409XX0 Polyhydramnios, unspecified trimester, not applicable or unspecified: Secondary | ICD-10-CM

## 2016-02-16 IMAGING — US US MFM FETAL BPP W/O NON-STRESS
1 series · 12 of 20 positions shown · non-contrast
Comparison: none

[Series 1: us mfm fetal bpp w/o non-stress · 20 acquisitions, 12 frames shown]
[im 1/20]
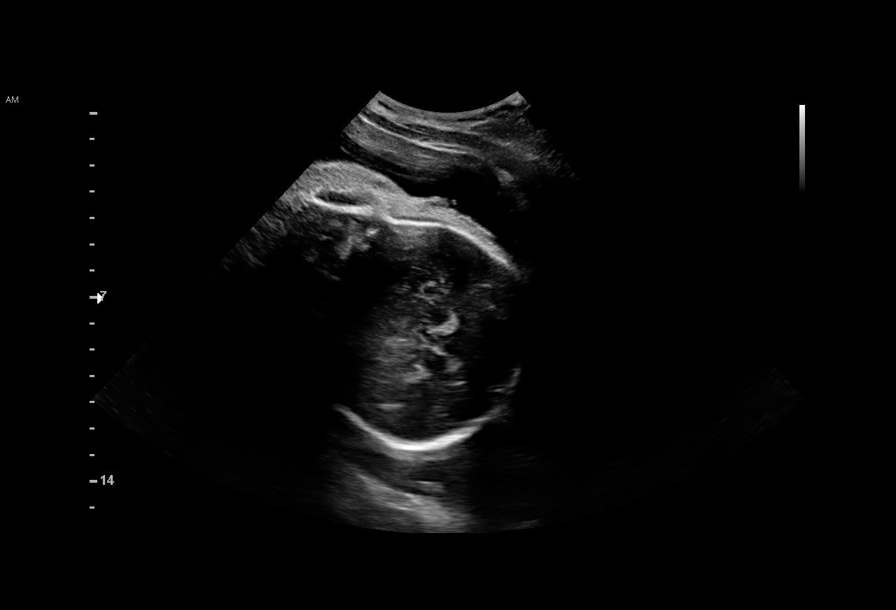
[im 3/20]
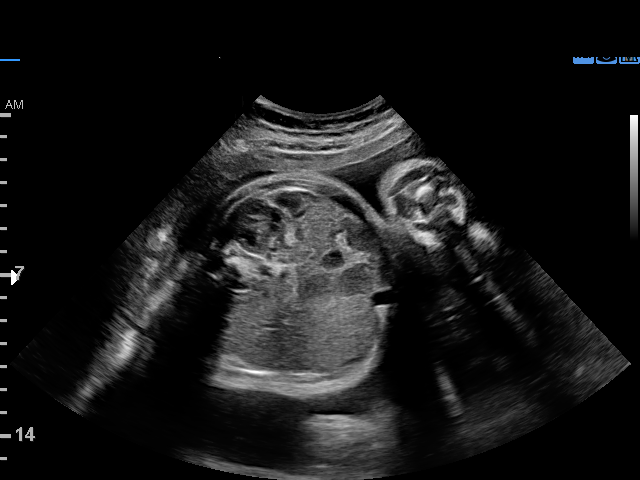
[im 5/20]
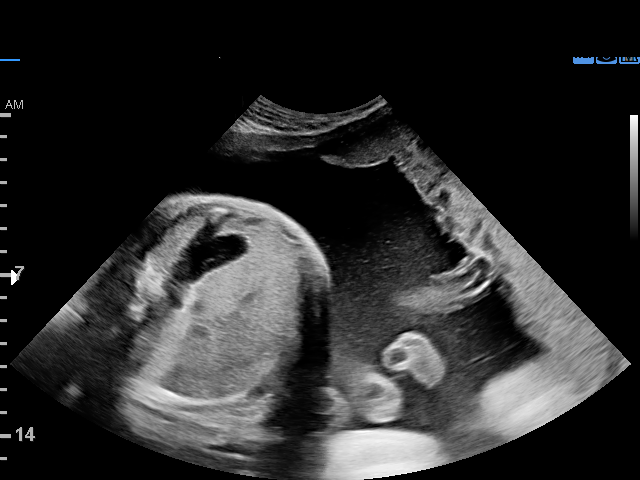
[im 6/20]
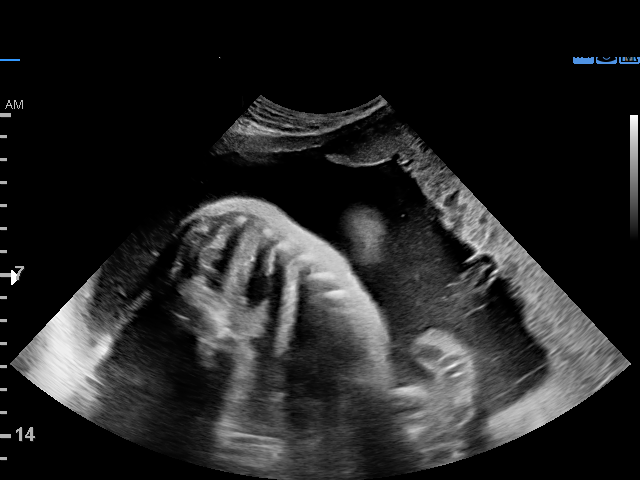
[im 8/20]
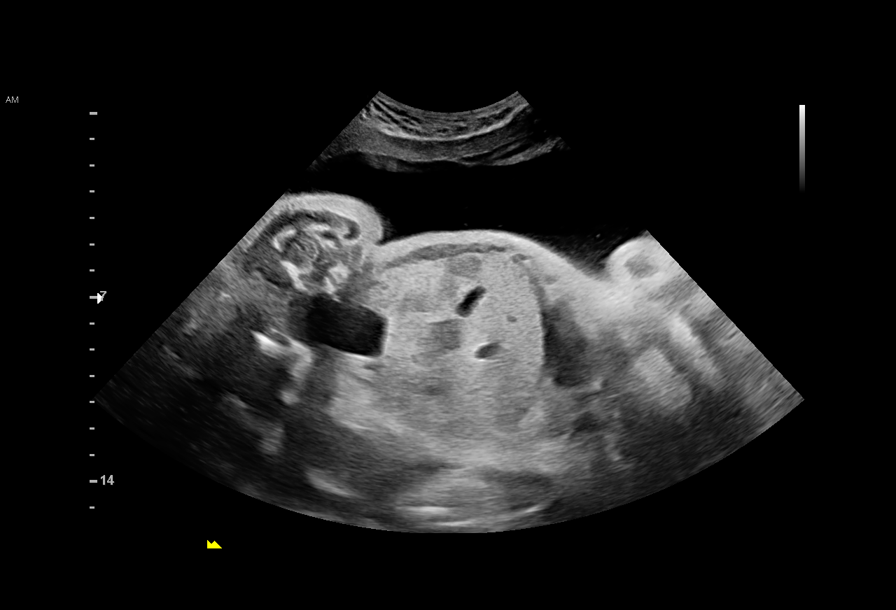
[im 10/20]
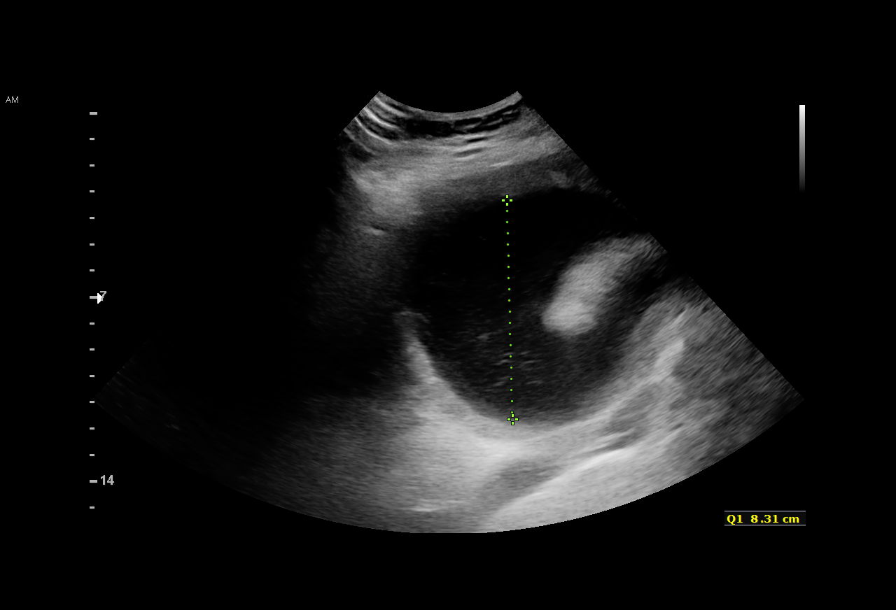
[im 11/20]
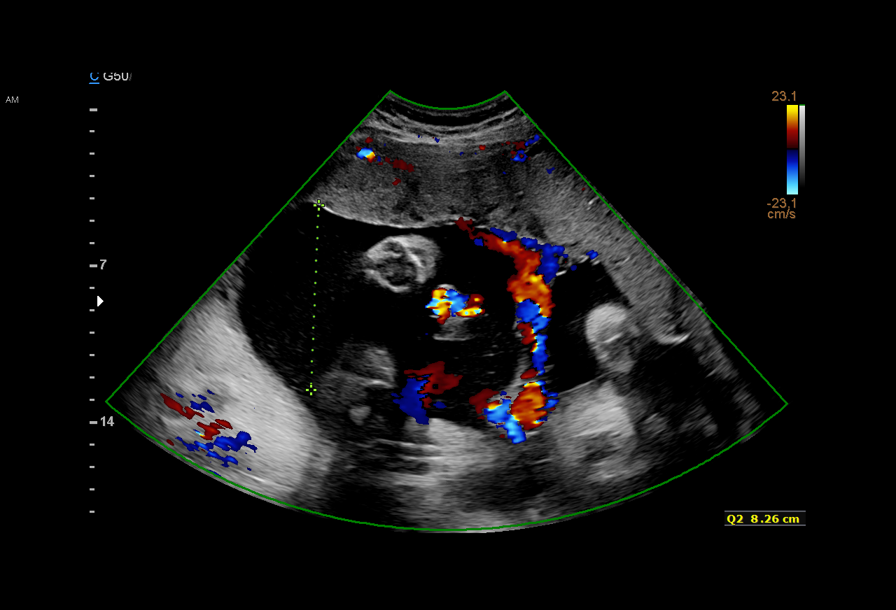
[im 13/20]
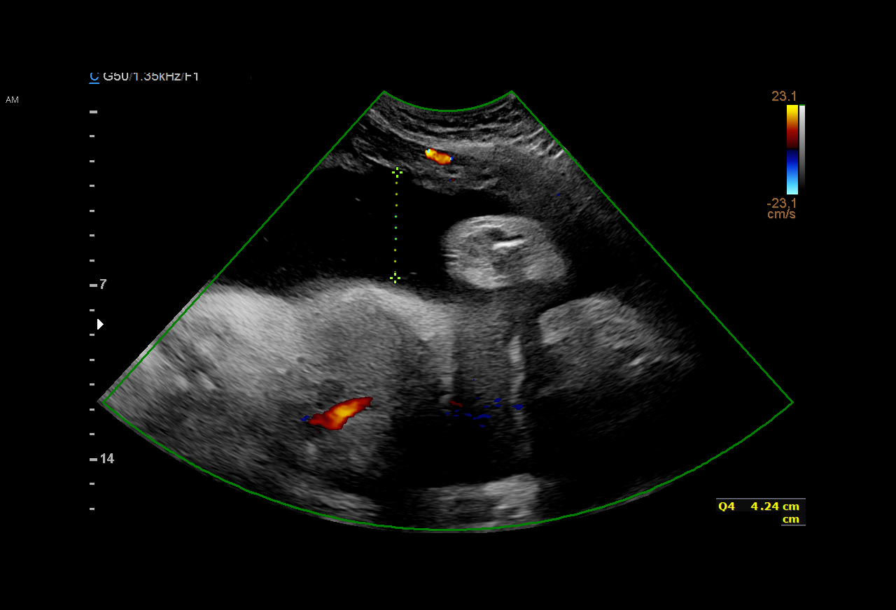
[im 15/20]
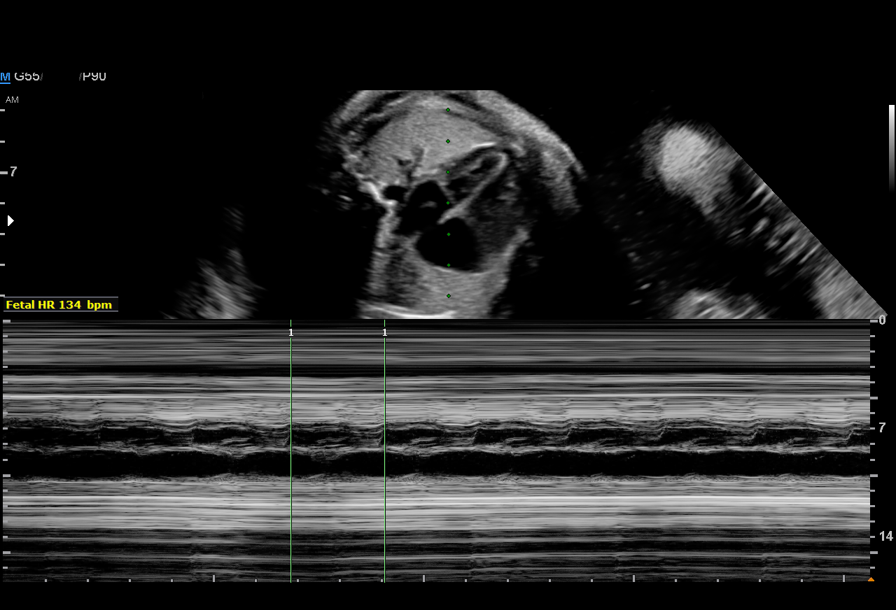
[im 16/20]
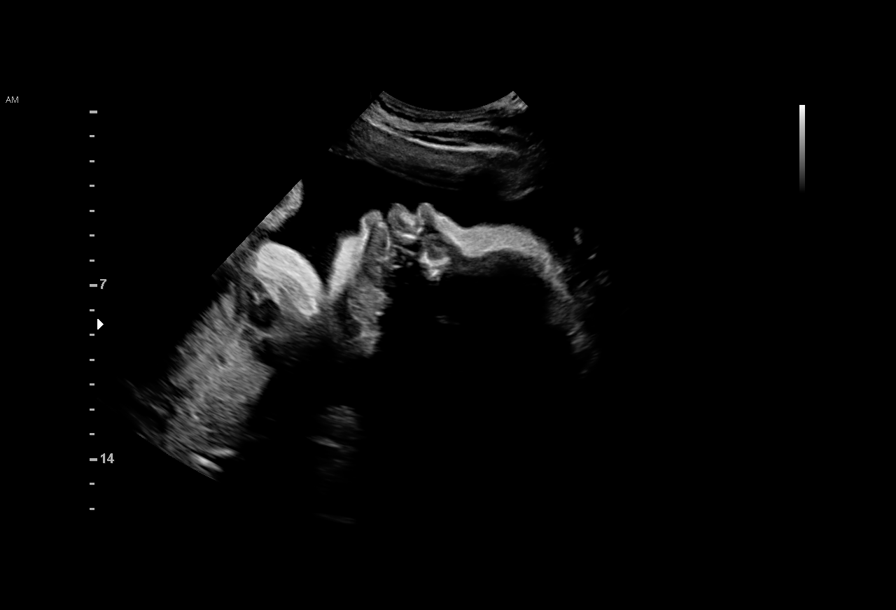
[im 18/20]
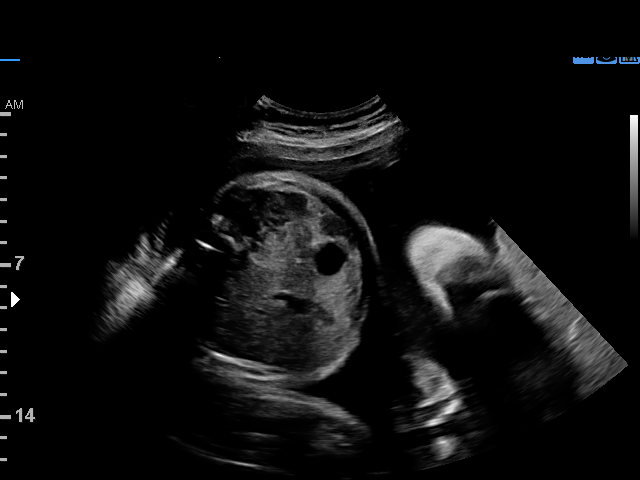
[im 20/20]
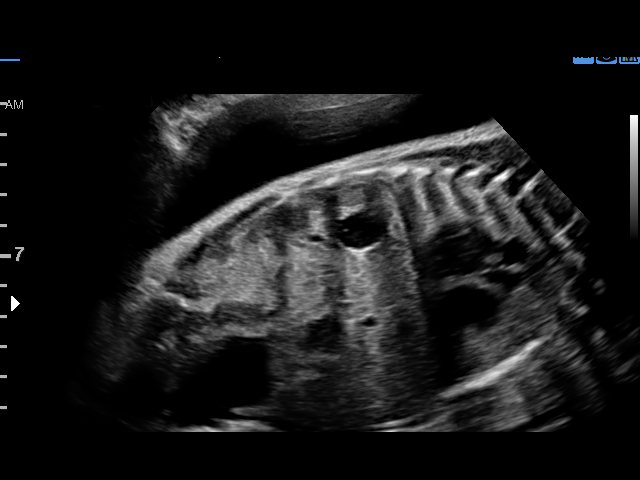

[12 of 20 positions shown; findings below may reference images not displayed]

Road [HOSPITAL]

1  UAN            [PHONE_NUMBER]      [PHONE_NUMBER]     [PHONE_NUMBER]
Indications

34 weeks gestation of pregnancy
Poor obstetric history: Previous gestational   [3S]
diabetes (diet-controlled)
Polyhydramnios, third trimester, antepartum    [3S]
condition or complication, unspecified fetus
OB History

Gravidity:    2         Term:   1
Living:       1
Fetal Evaluation

Num Of Fetuses:     1
Fetal Heart         134
Rate(bpm):
Cardiac Activity:   Observed
Presentation:       Cephalic

Amniotic Fluid
AFI FV:      Polyhydramnios

AFI Sum(cm)     %Tile       Largest Pocket(cm)
34.53           > 97

RUQ(cm)       RLQ(cm)       LUQ(cm)        LLQ(cm)
8.31
Biophysical Evaluation

Amniotic F.V:   Polyhydramnios             F. Tone:        Observed
F. Movement:    Observed                   Score:          [DATE]
F. Breathing:   Observed
Gestational Age

LMP:           34w 3d       Date:   [DATE]                 EDD:   [DATE]
Best:          34w 3d    Det. By:   LMP  ([DATE])          EDD:   [DATE]
Impression

SIUP at 34+3 weeks
Mild polyhydramnios
BPP [DATE]
Recommendations

Was scheduled for weekly BPPs here on [REDACTED]s OR
Twice weekly NSTs with weekly AFIs
Growth in 3 weeks

## 2016-02-19 ENCOUNTER — Other Ambulatory Visit: Payer: Self-pay | Admitting: Certified Nurse Midwife

## 2016-02-19 ENCOUNTER — Ambulatory Visit (INDEPENDENT_AMBULATORY_CARE_PROVIDER_SITE_OTHER): Payer: Medicaid Other | Admitting: Certified Nurse Midwife

## 2016-02-19 ENCOUNTER — Encounter: Payer: Self-pay | Admitting: Obstetrics

## 2016-02-19 VITALS — BP 110/61 | HR 92 | Wt 178.0 lb

## 2016-02-19 DIAGNOSIS — O0990 Supervision of high risk pregnancy, unspecified, unspecified trimester: Secondary | ICD-10-CM

## 2016-02-19 DIAGNOSIS — O24410 Gestational diabetes mellitus in pregnancy, diet controlled: Secondary | ICD-10-CM

## 2016-02-19 DIAGNOSIS — O403XX0 Polyhydramnios, third trimester, not applicable or unspecified: Secondary | ICD-10-CM

## 2016-02-19 DIAGNOSIS — Z8632 Personal history of gestational diabetes: Secondary | ICD-10-CM

## 2016-02-19 DIAGNOSIS — O09299 Supervision of pregnancy with other poor reproductive or obstetric history, unspecified trimester: Secondary | ICD-10-CM

## 2016-02-19 NOTE — Progress Notes (Signed)
Subjective:    Katelyn BoschLeona Bauer is a 25 y.o. female being seen today for her obstetrical visit. She is at 265w6d gestation. Patient reports no complaints. Fetal movement: normal.  Encouraged to check blood sugars.    Problem List Items Addressed This Visit      Endocrine   GDM (gestational diabetes mellitus), class A1     Other   Supervision of high risk pregnancy, antepartum   H/O gestational diabetes in prior pregnancy, currently pregnant - Primary     Patient Active Problem List   Diagnosis Date Noted  . GDM (gestational diabetes mellitus), class A1 02/19/2016  . H/O gestational diabetes in prior pregnancy, currently pregnant 08/24/2015  . Supervision of high risk pregnancy, antepartum 07/26/2015   Objective:    BP 110/61   Pulse 92   Wt 178 lb (80.7 kg)   LMP 06/20/2015 (LMP Unknown)   BMI 29.62 kg/m  FHT:  143 BPM  Uterine Size: 38 cm and size greater than dates  Presentation: cephalic     Assessment:    Pregnancy @ 6865w6d weeks   Polyhydramnios  Medical uncompliance: not checking sugars  Plan:     labs reviewed, problem list updated Consent signed. GBS planning TDAP offered  Rhogam given for RH negative Pediatrician: discussed. Infant feeding: plans to breastfeed. Maternity leave: discussed. Cigarette smoking: never smoked. No orders of the defined types were placed in this encounter.  No orders of the defined types were placed in this encounter.  Follow up in 1 Week.

## 2016-02-20 ENCOUNTER — Inpatient Hospital Stay (HOSPITAL_COMMUNITY)
Admission: AD | Admit: 2016-02-20 | Discharge: 2016-02-20 | Disposition: A | Discharge status: Home / Self Care | Payer: Medicaid Other | Source: Ambulatory Visit | Attending: Family Medicine | Admitting: Family Medicine

## 2016-02-20 ENCOUNTER — Encounter (HOSPITAL_COMMUNITY): Payer: Self-pay

## 2016-02-20 DIAGNOSIS — Z3A35 35 weeks gestation of pregnancy: Secondary | ICD-10-CM | POA: Insufficient documentation

## 2016-02-20 DIAGNOSIS — O4700 False labor before 37 completed weeks of gestation, unspecified trimester: Secondary | ICD-10-CM

## 2016-02-20 DIAGNOSIS — O479 False labor, unspecified: Secondary | ICD-10-CM | POA: Diagnosis not present

## 2016-02-20 DIAGNOSIS — Z3483 Encounter for supervision of other normal pregnancy, third trimester: Secondary | ICD-10-CM | POA: Insufficient documentation

## 2016-02-20 LAB — URINALYSIS, ROUTINE W REFLEX MICROSCOPIC
BILIRUBIN URINE: NEGATIVE
GLUCOSE, UA: NEGATIVE mg/dL
HGB URINE DIPSTICK: NEGATIVE
KETONES UR: NEGATIVE mg/dL
Leukocytes, UA: NEGATIVE
Nitrite: NEGATIVE
PH: 8 (ref 5.0–8.0)
Protein, ur: NEGATIVE mg/dL
SPECIFIC GRAVITY, URINE: 1.015 (ref 1.005–1.030)

## 2016-02-20 NOTE — MAU Note (Signed)
Been having contractions.  Getting closer and stronger. No bleeding, some d/c.

## 2016-02-20 NOTE — Discharge Instructions (Signed)
Braxton Hicks Contractions °Contractions of the uterus can occur throughout pregnancy. Contractions are not always a sign that you are in labor.  °WHAT ARE BRAXTON HICKS CONTRACTIONS?  °Contractions that occur before labor are called Braxton Hicks contractions, or false labor. Toward the end of pregnancy (32-34 weeks), these contractions can develop more often and may become more forceful. This is not true labor because these contractions do not result in opening (dilatation) and thinning of the cervix. They are sometimes difficult to tell apart from true labor because these contractions can be forceful and people have different pain tolerances. You should not feel embarrassed if you go to the hospital with false labor. Sometimes, the only way to tell if you are in true labor is for your health care provider to look for changes in the cervix. °If there are no prenatal problems or other health problems associated with the pregnancy, it is completely safe to be sent home with false labor and await the onset of true labor. °HOW CAN YOU TELL THE DIFFERENCE BETWEEN TRUE AND FALSE LABOR? °False Labor  °· The contractions of false labor are usually shorter and not as hard as those of true labor.   °· The contractions are usually irregular.   °· The contractions are often felt in the front of the lower abdomen and in the groin.   °· The contractions may go away when you walk around or change positions while lying down.   °· The contractions get weaker and are shorter lasting as time goes on.   °· The contractions do not usually become progressively stronger, regular, and closer together as with true labor.   °True Labor  °· Contractions in true labor last 30-70 seconds, become very regular, usually become more intense, and increase in frequency.   °· The contractions do not go away with walking.   °· The discomfort is usually felt in the top of the uterus and spreads to the lower abdomen and low back.   °· True labor can be  determined by your health care provider with an exam. This will show that the cervix is dilating and getting thinner.   °WHAT TO REMEMBER °· Keep up with your usual exercises and follow other instructions given by your health care provider.   °· Take medicines as directed by your health care provider.   °· Keep your regular prenatal appointments.   °· Eat and drink lightly if you think you are going into labor.   °· If Braxton Hicks contractions are making you uncomfortable:   °¨ Change your position from lying down or resting to walking, or from walking to resting.   °¨ Sit and rest in a tub of warm water.   °¨ Drink 2-3 glasses of water. Dehydration may cause these contractions.   °¨ Do slow and deep breathing several times an hour.   °WHEN SHOULD I SEEK IMMEDIATE MEDICAL CARE? °Seek immediate medical care if: °· Your contractions become stronger, more regular, and closer together.   °· You have fluid leaking or gushing from your vagina.   °· You have a fever.   °· You pass blood-tinged mucus.   °· You have vaginal bleeding.   °· You have continuous abdominal pain.   °· You have low back pain that you never had before.   °· You feel your baby's head pushing down and causing pelvic pressure.   °· Your baby is not moving as much as it used to.   °This information is not intended to replace advice given to you by your health care provider. Make sure you discuss any questions you have with your health care   provider. °Document Released: 03/11/2005 Document Revised: 07/03/2015 Document Reviewed: 12/21/2012 °Elsevier Interactive Patient Education © 2017 Else[employer] Inc. ° °

## 2016-02-20 NOTE — MAU Provider Note (Signed)
MAU PROVIDER NOTE  Chief Complaint:   Pre-term contractions   HPI: [name] is a 25 y.o. G2P1001 at 3345w0d who presents to maternity admissions reporting contractions started over the weekend about 5-10 min apart.  Denies fluid leakage, denies vaginal bleeding.   Does feel baby moving.  Cervix .5 cm.   Takes prenatal vitamins only.  This is her second baby, no complications with first baby.  First baby was vaginal, late at 41 weeks. No recent illnesses or infections.    Denies contractions, leakage of fluid or vaginal bleeding. Good fetal movement.   Pregnancy Course: polyhydramnios  Past Medical History: Past Medical History:  Diagnosis Date  . Medical history non-contributory    Past obstetric history: OB History  Gravida Para Term Preterm AB Living  2 1 1  0 0 1  SAB TAB Ectopic Multiple Live Births  0 0 0 0 1    # Outcome Date GA Lbr Len/2nd Weight Sex Delivery Anes PTL Lv  2 Current           1 Term 10/17/13   6 lb 11.2 oz (3.039 kg) F Vag-Spont EPI N LIV    Obstetric Comments  [place]  History of Twins runs in the family. Patient is a twin And her father is a twin.   Past Surgical History: Past Surgical History:  Procedure Laterality Date  . NO PAST SURGERIES     Family History: Family History  Problem Relation Age of Onset  . Lung cancer Paternal Grandmother    Social History: Social History  Substance Use Topics  . Smoking status: Former Games developermoker  . Smokeless tobacco: Never Used     Comment: Positive Pregnancy Test Plans to Quit Today 07/26/15  . Alcohol use 0.0 oz/week     Comment: Occasional   Allergies:  Allergies  Allergen Reactions  . Penicillins Swelling and Other (See Comments)    Reaction:  All over body swelling  Has patient had a PCN reaction causing immediate rash, facial/tongue/throat swelling, SOB or lightheadedness with hypotension: Yes Has patient had a PCN reaction causing severe rash involving mucus membranes or skin  necrosis: No Has patient had a PCN reaction that required hospitalization No Has patient had a PCN reaction occurring within the last 10 years: No If all of the above answers are "NO", then may proceed with Cephalosporin use.     Meds:  Prescriptions Prior to Admission  Medication Sig Dispense Refill Last Dose  . Iron Polysacch Cmplx-B12-FA 150-0.025-1 MG CAPS Take 1 capsule by mouth daily.   02/20/2016 at Unknown time  . Prenatal Vit-Fe Phos-FA-Omega (VITAFOL GUMMIES) 3.33-0.333-34.8 MG CHEW Chew 3 each by mouth daily.   02/20/2016 at Unknown time   I have reviewed patient's Past Medical Hx, Surgical Hx, Family Hx, Social Hx, medications and allergies.   ROS:  A comprehensive ROS was negative except per HPI.   Physical Exam   Patient Vitals for the past 24 hrs:  BP Temp Temp src Pulse Resp SpO2 Height Weight  02/20/16 1623 - - - 77 - 99 % - -  02/20/16 1618 - - - 77 - 99 % - -  02/20/16 1613 - - - 80 - 99 % - -  02/20/16 1520 - - - 77 - 97 % - -  02/20/16 1459 110/64 - - 81 - - - -  02/20/16 1458 - - - 83 - 97 % - -  02/20/16 1445 107/62 98.4 F (36.9 C) Oral 76  18 100 % 5\' 5"  (1.651 m) 176 lb 12.8 oz (80.2 kg)   Constitutional: Well-developed, well-nourished female in no acute distress.  Cardiovascular: normal rate Respiratory: normal effort GI: Abd soft, non-tender, gravid appropriate for gestational age. Pos BS x 4 MS: Extremities nontender, no edema, normal ROM Neurologic: Alert and oriented x 4.  GU: Neg CVAT.  Pelvic: NEFG, physiologic discharge, no blood, cervix clean. No CMT  Dilation: Fingertip Effacement (%): Thick Cervical Position: Posterior Station:  (high) Exam by:: dr Kattie Santoyo/cwicker,rnc  FHT:  Baseline 130s , moderate variability, accelerations present, no decelerations Contractions: irregular Q 10  mins   Labs: Results for orders placed or performed during the hospital encounter of 02/20/16 (from the past 24 hour(s))  Urinalysis, Routine w reflex  microscopic (not at [facility])     Status: Abnormal   Collection Time: 02/20/16  2:31 PM  Result Value Ref Range   Color, Urine YELLOW YELLOW   APPearance HAZY (A) CLEAR   Specific Gravity, Urine 1.015 1.005 - 1.030   pH 8.0 5.0 - 8.0   Glucose, UA NEGATIVE NEGATIVE mg/dL   Hgb urine dipstick NEGATIVE NEGATIVE   Bilirubin Urine NEGATIVE NEGATIVE   Ketones, ur NEGATIVE NEGATIVE mg/dL   Protein, ur NEGATIVE NEGATIVE mg/dL   Nitrite NEGATIVE NEGATIVE   Leukocytes, UA NEGATIVE NEGATIVE   Imaging:  [facility] Mfm Fetal Bpp Wo Non Stress  Result Date: 02/16/2016 OBSTETRICAL ULTRASOUND: This exam was performed within a [facility] Department. The OB US report was generated in the AS system, and faxed to the ordering physician.  This report is available in the [employer] PACS. See the AS Obstetric US report via the Image Link.  [place] Mfm Fetal Bpp Wo Non Stress  Result Date: 02/09/2016 OBSTETRICAL ULTRASOUND: This exam was performed within a [facility] Department. The OB US report was generated in the AS system, and faxed to the ordering physician.  This report is available in the [employer] PACS. See the AS Obstetric US report via the Image Link.  [place] Mfm Ob Detail +14 Wk  Result Date: 02/09/2016 OBSTETRICAL ULTRASOUND: This exam was performed within a  Ultrasound Department. The OB US report was generated in the AS system, and faxed to the ordering physician.  This report is available in the [employer] PACS. See the AS Obstetric US report via the Image Link.  MAU Course: Cervical check - 0.5 cm  MDM: Plan of care reviewed with patient, including labs and tests ordered and medical treatment.  Assessment: 1. Preterm contractions    Plan: Patient at 35 weeks with contractions but no cervical change.  Discharge home in stable condition.  No betamethasone injection needed at this time.  Labor precautions discussed.      Medication List    TAKE these medications   Iron  Polysacch Cmplx-B12-FA 150-0.025-1 MG Caps Take 1 capsule by mouth daily.   VITAFOL GUMMIES 3.33-0.333-34.8 MG Chew Chew 3 each by mouth daily.      Freddrick MarchYashika Jamail Cullers, MD PGY-1 02/20/2016 4:38 PM

## 2016-02-21 ENCOUNTER — Encounter (HOSPITAL_COMMUNITY): Payer: Self-pay

## 2016-02-21 ENCOUNTER — Ambulatory Visit (HOSPITAL_COMMUNITY)
Admission: RE | Admit: 2016-02-21 | Discharge: 2016-02-21 | Disposition: A | Discharge status: Home / Self Care | Payer: Medicaid Other | Source: Ambulatory Visit | Attending: Certified Nurse Midwife | Admitting: Certified Nurse Midwife

## 2016-02-21 DIAGNOSIS — O409XX0 Polyhydramnios, unspecified trimester, not applicable or unspecified: Secondary | ICD-10-CM

## 2016-02-21 DIAGNOSIS — Z3A35 35 weeks gestation of pregnancy: Secondary | ICD-10-CM | POA: Insufficient documentation

## 2016-02-21 DIAGNOSIS — O403XX0 Polyhydramnios, third trimester, not applicable or unspecified: Secondary | ICD-10-CM | POA: Diagnosis not present

## 2016-02-21 IMAGING — US US MFM FETAL BPP W/O NON-STRESS
1 series · 12 of 12 positions shown · non-contrast
Comparison: none

[Series 1: us mfm fetal bpp w/o non-stress · 12 acquisitions, 12 frames shown]
[im 1/12]
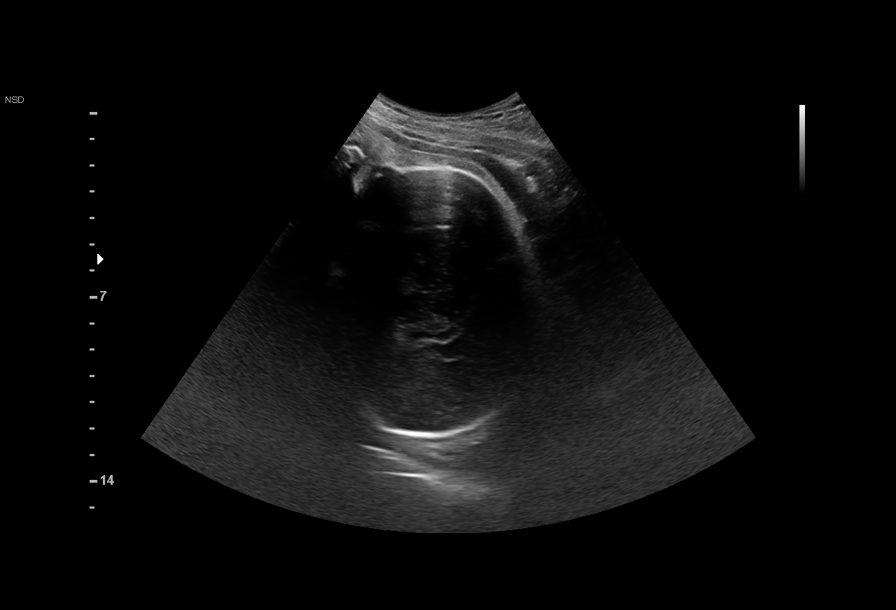
[im 2/12]
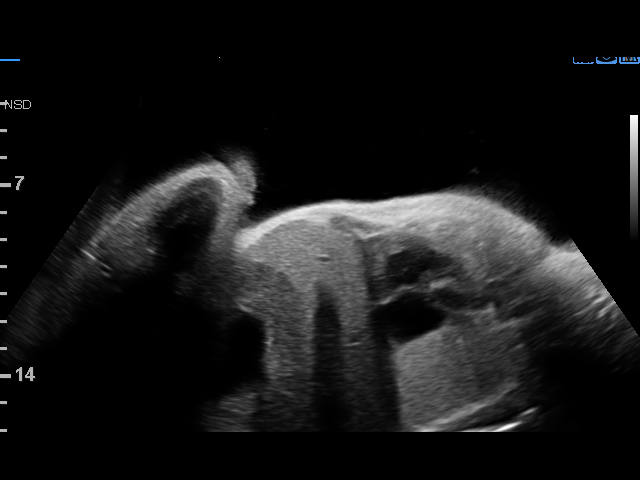
[im 3/12]
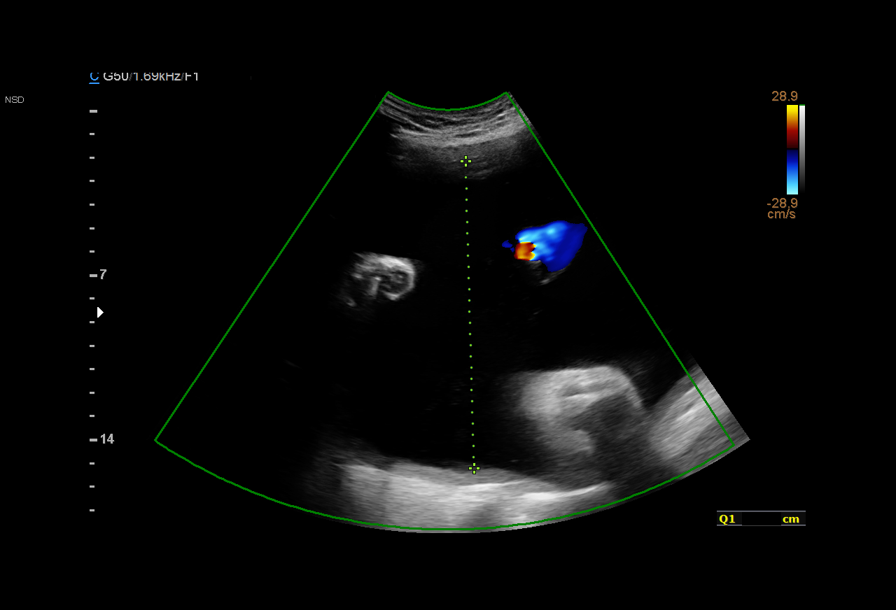
[im 4/12]
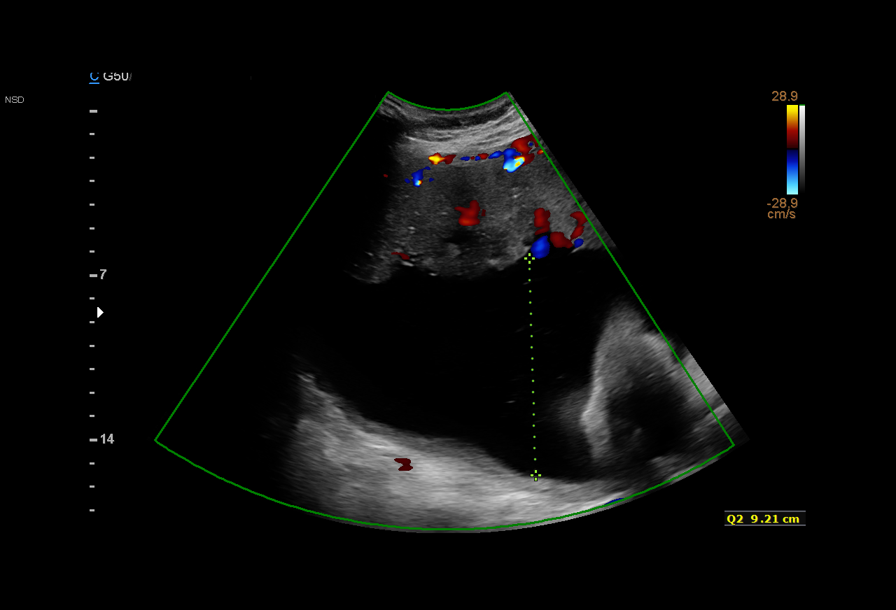
[im 5/12]
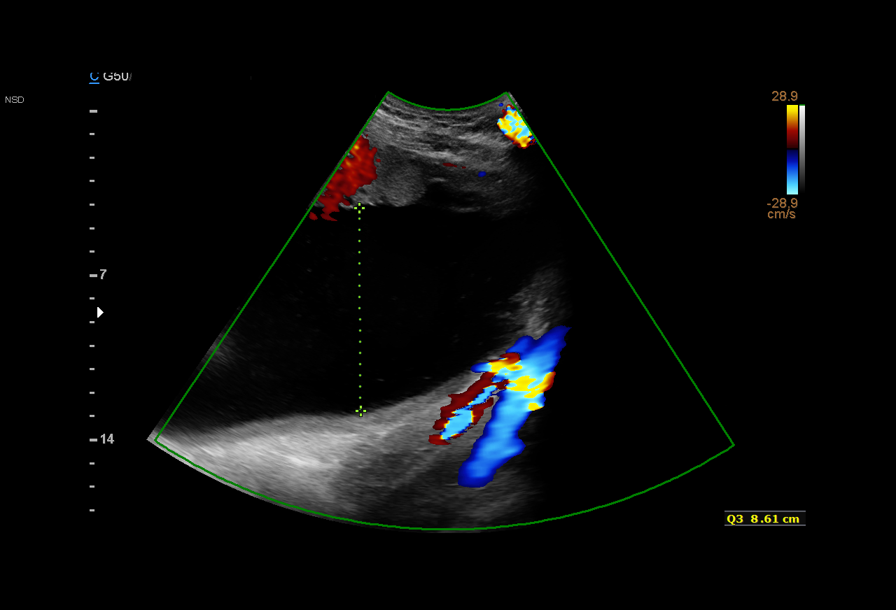
[im 6/12]
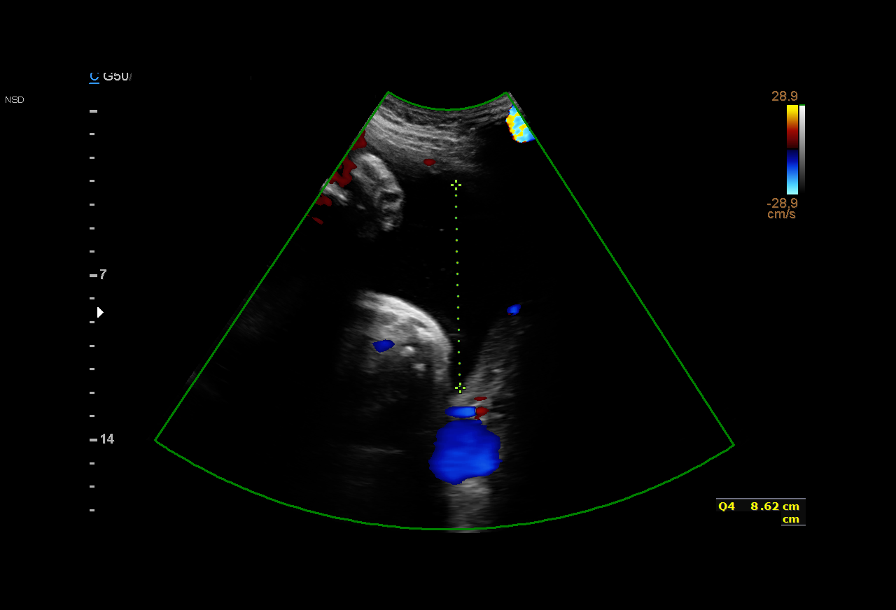
[im 7/12]
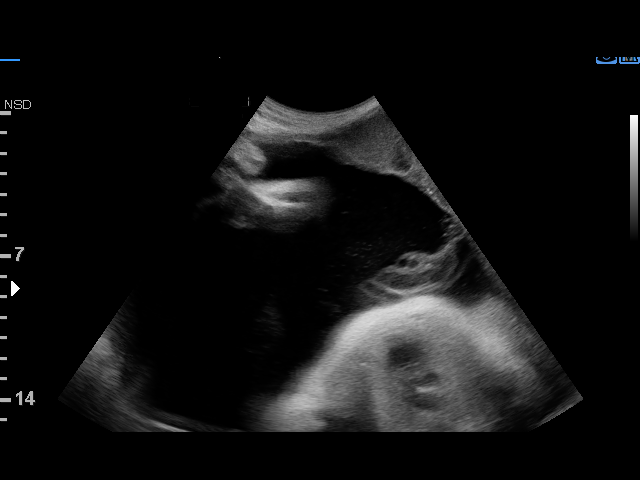
[im 8/12]
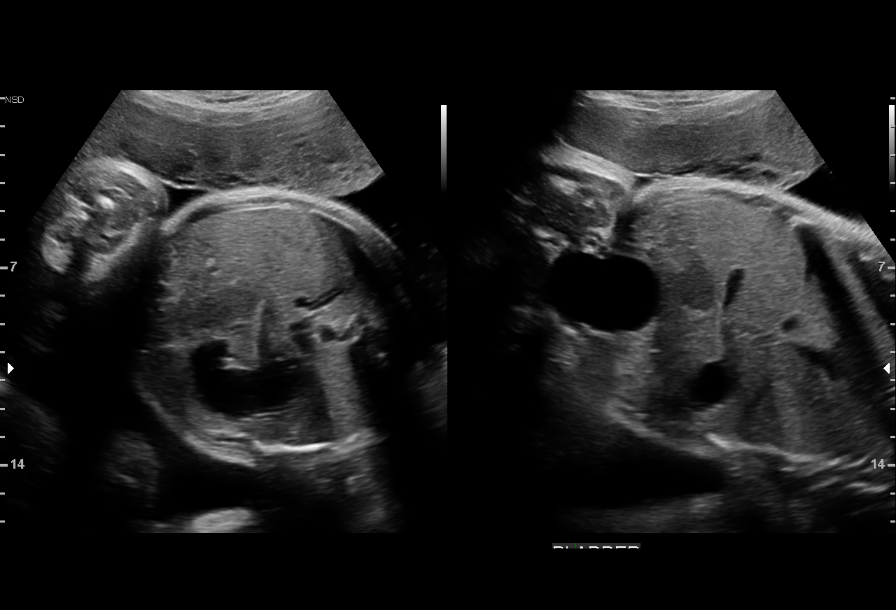
[im 9/12]
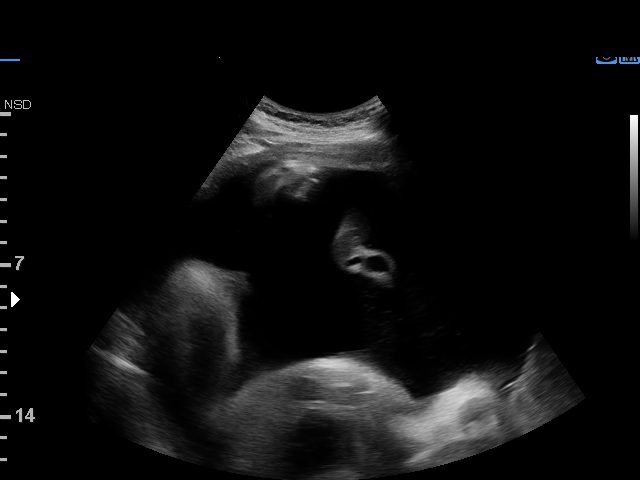
[im 10/12]
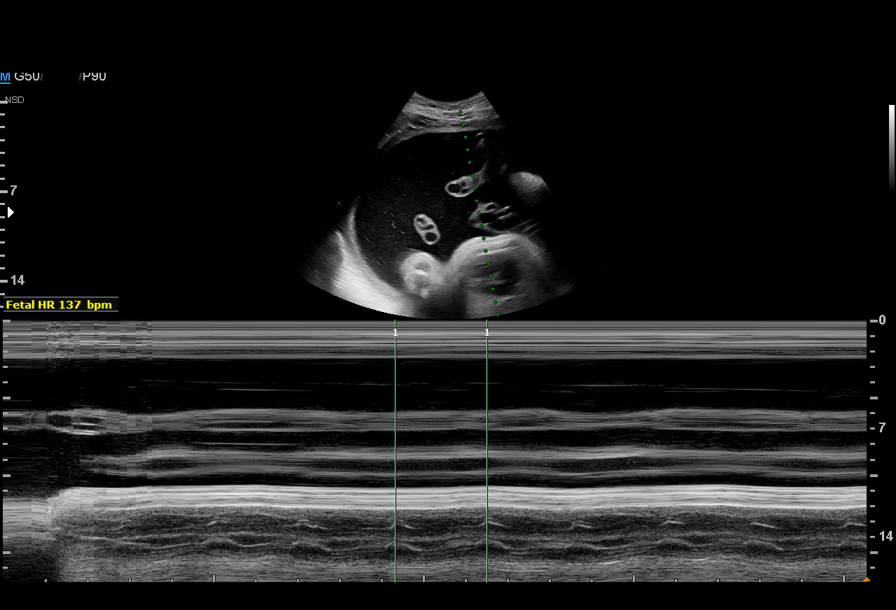
[im 11/12]
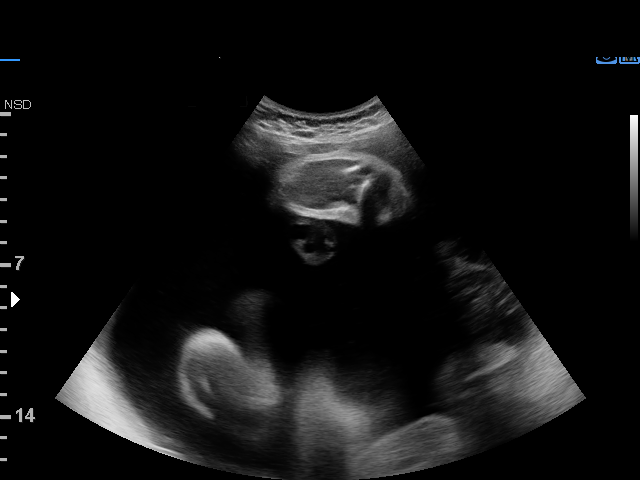
[im 12/12]
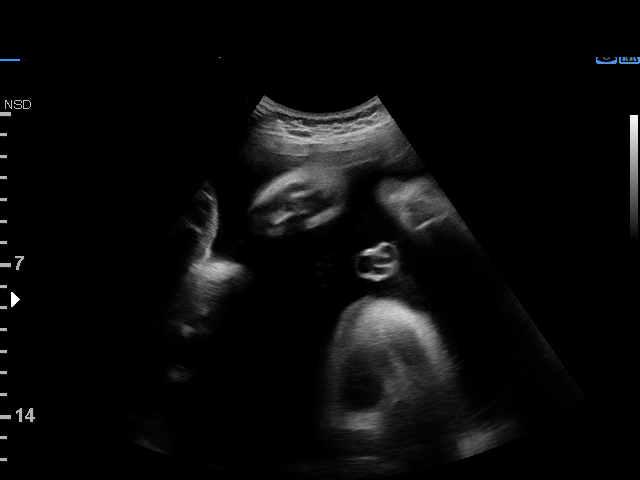

[12 of 12 positions shown; findings below may reference images not displayed]

Road [HOSPITAL]

1  [facility]            [PHONE_NUMBER]      [PHONE_NUMBER]     [PHONE_NUMBER]
Indications

35 weeks gestation of pregnancy
Poor obstetric history: Previous gestational   [DI]
diabetes (diet-controlled)
Polyhydramnios, third trimester, antepartum    [DI]
condition or complication, unspecified fetus
OB History

Gravidity:    2         Term:   1
Living:       1
Fetal Evaluation

Num Of Fetuses:     1
Fetal Heart         137
Rate(bpm):
Cardiac Activity:   Observed
Presentation:       Cephalic

Amniotic Fluid
AFI FV:      Polyhydramnios

AFI Sum(cm)     %Tile       Largest Pocket(cm)
39.49           > 97

RUQ(cm)       RLQ(cm)       LUQ(cm)        LLQ(cm)
13.05
Biophysical Evaluation

Amniotic F.V:   Polyhydramnios             F. Tone:        Observed
F. Movement:    Observed                   Score:          [DATE]
F. Breathing:   Observed
Gestational Age

LMP:           35w 1d       Date:   [DATE]                 EDD:   [DATE]
Best:          35w 1d    Det. By:   LMP  ([DATE])          EDD:   [DATE]
Impression

IUP at 35+1 weeks with idiopathic polyhydramnios
Normal fetal movement and cardiac activity
AFI shows elevated fluid at 39 cm
BPP [DATE]
Recommendations

Repeat BPP here in 1 week. no changes in obstetrical
regimen at this time

## 2016-02-23 ENCOUNTER — Inpatient Hospital Stay (HOSPITAL_COMMUNITY)
Admission: AD | Admit: 2016-02-23 | Discharge: 2016-02-23 | Disposition: A | Discharge status: Home / Self Care | Payer: Medicaid Other | Source: Ambulatory Visit | Attending: Obstetrics & Gynecology | Admitting: Obstetrics & Gynecology

## 2016-02-23 ENCOUNTER — Encounter (HOSPITAL_COMMUNITY): Payer: Self-pay | Admitting: *Deleted

## 2016-02-23 DIAGNOSIS — O403XX0 Polyhydramnios, third trimester, not applicable or unspecified: Secondary | ICD-10-CM | POA: Diagnosis not present

## 2016-02-23 DIAGNOSIS — Z87891 Personal history of nicotine dependence: Secondary | ICD-10-CM | POA: Insufficient documentation

## 2016-02-23 DIAGNOSIS — O471 False labor at or after 37 completed weeks of gestation: Secondary | ICD-10-CM | POA: Diagnosis not present

## 2016-02-23 DIAGNOSIS — O4703 False labor before 37 completed weeks of gestation, third trimester: Secondary | ICD-10-CM | POA: Diagnosis not present

## 2016-02-23 DIAGNOSIS — O479 False labor, unspecified: Secondary | ICD-10-CM

## 2016-02-23 DIAGNOSIS — Z880 Allergy status to penicillin: Secondary | ICD-10-CM | POA: Insufficient documentation

## 2016-02-23 DIAGNOSIS — Z3A38 38 weeks gestation of pregnancy: Secondary | ICD-10-CM

## 2016-02-23 DIAGNOSIS — Z3A35 35 weeks gestation of pregnancy: Secondary | ICD-10-CM | POA: Diagnosis not present

## 2016-02-23 DIAGNOSIS — R110 Nausea: Secondary | ICD-10-CM | POA: Diagnosis not present

## 2016-02-23 DIAGNOSIS — R1030 Lower abdominal pain, unspecified: Secondary | ICD-10-CM | POA: Diagnosis present

## 2016-02-23 LAB — URINE MICROSCOPIC-ADD ON: BACTERIA UA: NONE SEEN

## 2016-02-23 LAB — URINALYSIS, ROUTINE W REFLEX MICROSCOPIC
Bilirubin Urine: NEGATIVE
GLUCOSE, UA: NEGATIVE mg/dL
Hgb urine dipstick: NEGATIVE
Ketones, ur: NEGATIVE mg/dL
Nitrite: NEGATIVE
PROTEIN: NEGATIVE mg/dL
Specific Gravity, Urine: 1.015 (ref 1.005–1.030)
pH: 7 (ref 5.0–8.0)

## 2016-02-23 MED ORDER — ONDANSETRON 4 MG PO TBDP
4.0000 mg | ORAL_TABLET | Freq: Once | ORAL | Status: AC
  Administered 2016-02-23: 4 mg via ORAL
  Filled 2016-02-23: qty 1

## 2016-02-23 MED ORDER — ONDANSETRON 4 MG PO TBDP: 4.0000 mg | ORAL_TABLET | Freq: Once | ORAL | 0 refills | Status: AC

## 2016-02-23 NOTE — Discharge Instructions (Signed)
Braxton Hicks Contractions °Contractions of the uterus can occur throughout pregnancy. Contractions are not always a sign that you are in labor.  °WHAT ARE BRAXTON HICKS CONTRACTIONS?  °Contractions that occur before labor are called Braxton Hicks contractions, or false labor. Toward the end of pregnancy (32-34 weeks), these contractions can develop more often and may become more forceful. This is not true labor because these contractions do not result in opening (dilatation) and thinning of the cervix. They are sometimes difficult to tell apart from true labor because these contractions can be forceful and people have different pain tolerances. You should not feel embarrassed if you go to the hospital with false labor. Sometimes, the only way to tell if you are in true labor is for your health care provider to look for changes in the cervix. °If there are no prenatal problems or other health problems associated with the pregnancy, it is completely safe to be sent home with false labor and await the onset of true labor. °HOW CAN YOU TELL THE DIFFERENCE BETWEEN TRUE AND FALSE LABOR? °False Labor  °· The contractions of false labor are usually shorter and not as hard as those of true labor.   °· The contractions are usually irregular.   °· The contractions are often felt in the front of the lower abdomen and in the groin.   °· The contractions may go away when you walk around or change positions while lying down.   °· The contractions get weaker and are shorter lasting as time goes on.   °· The contractions do not usually become progressively stronger, regular, and closer together as with true labor.   °True Labor  °· Contractions in true labor last 30-70 seconds, become very regular, usually become more intense, and increase in frequency.   °· The contractions do not go away with walking.   °· The discomfort is usually felt in the top of the uterus and spreads to the lower abdomen and low back.   °· True labor can be  determined by your health care provider with an exam. This will show that the cervix is dilating and getting thinner.   °WHAT TO REMEMBER °· Keep up with your usual exercises and follow other instructions given by your health care provider.   °· Take medicines as directed by your health care provider.   °· Keep your regular prenatal appointments.   °· Eat and drink lightly if you think you are going into labor.   °· If Braxton Hicks contractions are making you uncomfortable:   °¨ Change your position from lying down or resting to walking, or from walking to resting.   °¨ Sit and rest in a tub of warm water.   °¨ Drink 2-3 glasses of water. Dehydration may cause these contractions.   °¨ Do slow and deep breathing several times an hour.   °WHEN SHOULD I SEEK IMMEDIATE MEDICAL CARE? °Seek immediate medical care if: °· Your contractions become stronger, more regular, and closer together.   °· You have fluid leaking or gushing from your vagina.   °· You have a fever.   °· You pass blood-tinged mucus.   °· You have vaginal bleeding.   °· You have continuous abdominal pain.   °· You have low back pain that you never had before.   °· You feel your baby's head pushing down and causing pelvic pressure.   °· Your baby is not moving as much as it used to.   °This information is not intended to replace advice given to you by your health care provider. Make sure you discuss any questions you have with your health care   provider. °Document Released: 03/11/2005 Document Revised: 07/03/2015 Document Reviewed: 12/21/2012 °Elsevier Interactive Patient Education © 2017 Else[employer] Inc. ° °

## 2016-02-23 NOTE — MAU Provider Note (Signed)
History     CSN: [id number]  Arrival date and time: 02/23/16 1058   None     Chief Complaint  Patient presents with  . Contractions  . Morning Sickness   HPI Ms Katelyn Bauer is a 25yo G2P1001 @ 35.3wks who presents for eval of low abd pain/tightening intermittently today; she also c/o nausea but no vomiting. Denies fever or diarrhea. No bldg, leaking or strong ctx. Her preg has been followed by the [facility] office and has been remarkable for 1) borderline GDM 2) polyhydramnios  OB History    Gravida Para Term Preterm AB Living   2 1 1  0 0 1   SAB TAB Ectopic Multiple Live Births   0 0 0 0 1      Obstetric Comments   [place]ivered in [place]  History of Twins runs in the family. Patient is a twin And her father is a twin.      Past Medical History:  Diagnosis Date  . Medical history non-contributory     Past Surgical History:  Procedure Laterality Date  . NO PAST SURGERIES      Family History  Problem Relation Age of Onset  . Lung cancer Paternal Grandmother     Social History  Substance Use Topics  . Smoking status: Former Games developermoker  . Smokeless tobacco: Never Used     Comment: Positive Pregnancy Test Plans to Quit Today 07/26/15  . Alcohol use 0.0 oz/week     Comment: Occasional    Allergies:  Allergies  Allergen Reactions  . Penicillins Swelling and Other (See Comments)    Reaction:  All over body swelling  Has patient had a PCN reaction causing immediate rash, facial/tongue/throat swelling, SOB or lightheadedness with hypotension: Yes Has patient had a PCN reaction causing severe rash involving mucus membranes or skin necrosis: No Has patient had a PCN reaction that required hospitalization No Has patient had a PCN reaction occurring within the last 10 years: No If all of the above answers are "NO", then may proceed with Cephalosporin use.      Prescriptions Prior to Admission  Medication Sig Dispense Refill Last Dose  . Iron Polysacch Cmplx-B12-FA  150-0.025-1 MG CAPS Take 1 capsule by mouth daily.   02/23/2016 at Unknown time  . Prenatal Vit-Fe Fumarate-FA (PRENATAL MULTIVITAMIN) TABS tablet Take 1 tablet by mouth daily at 12 noon.   02/23/2016 at Unknown time    ROS No other pertinents other than what is listed in HPI Physical Exam   Blood pressure 121/70, pulse 90, temperature 98.3 F (36.8 C), temperature source Oral, resp. rate 18, last menstrual period 06/20/2015.  Physical Exam  Constitutional: She is oriented to person, place, and time. She appears well-developed.  HENT:  Head: Normocephalic.  Neck: Normal range of motion.  Cardiovascular: Normal rate.   Respiratory: Effort normal.  GI:  EFM 130s, +accels, no decels Toco: some irritability  Genitourinary: Vagina normal.  Genitourinary Comments: Cx closed-FT/thick/post  Musculoskeletal: Normal range of motion.  Neurological: She is alert and oriented to person, place, and time.  Skin: Skin is warm and dry.  Psychiatric: She has a normal mood and affect. Her behavior is normal. Thought content normal.   Urinalysis    Component Value Date/Time   COLORURINE YELLOW 02/23/2016 1110   APPEARANCEUR CLEAR 02/23/2016 1110   LABSPEC 1.015 02/23/2016 1110   PHURINE 7.0 02/23/2016 1110   GLUCOSEU NEGATIVE 02/23/2016 1110   HGBUR NEGATIVE 02/23/2016 1110   BILIRUBINUR NEGATIVE 02/23/2016 1110  BILIRUBINUR Neg 11/07/2015 1458   KETONESUR NEGATIVE 02/23/2016 1110   PROTEINUR NEGATIVE 02/23/2016 1110   UROBILINOGEN 0.2 11/07/2015 1458   UROBILINOGEN 1.0 11/24/2014 1403   NITRITE NEGATIVE 02/23/2016 1110   LEUKOCYTESUR SMALL (A) 02/23/2016 1110   Micro: 0-5 SE  MAU Course  Procedures  MDM UA ordered Given Zofran ODT for nausea  Assessment and Plan  IUP@35 .3wks Braxton Hicks ctx Nausea of preg  D/C home with PTL precautions Rx Zofran for intermittent nausea F/U on Monday for scheduled OB visit  Cam HaiSHAW, Shaelynn Dragos CNM 02/23/2016, 12:30 PM

## 2016-02-23 NOTE — MAU Note (Addendum)
States she has been having abdominal pain that she thinks is contractions. States she has not slept. C/O nausea. Has not vomited. Being seen at [facility] for polyhydramnios.

## 2016-02-26 ENCOUNTER — Ambulatory Visit (INDEPENDENT_AMBULATORY_CARE_PROVIDER_SITE_OTHER): Payer: Medicaid Other | Admitting: Obstetrics and Gynecology

## 2016-02-26 ENCOUNTER — Other Ambulatory Visit (HOSPITAL_COMMUNITY)
Admission: RE | Admit: 2016-02-26 | Discharge: 2016-02-26 | Disposition: A | Discharge status: Home / Self Care | Payer: Medicaid Other | Source: Ambulatory Visit | Attending: Obstetrics and Gynecology | Admitting: Obstetrics and Gynecology

## 2016-02-26 VITALS — BP 117/70 | HR 92 | Wt 177.6 lb

## 2016-02-26 DIAGNOSIS — O24410 Gestational diabetes mellitus in pregnancy, diet controlled: Secondary | ICD-10-CM

## 2016-02-26 DIAGNOSIS — Z113 Encounter for screening for infections with a predominantly sexual mode of transmission: Secondary | ICD-10-CM | POA: Diagnosis not present

## 2016-02-26 DIAGNOSIS — O403XX0 Polyhydramnios, third trimester, not applicable or unspecified: Secondary | ICD-10-CM

## 2016-02-26 DIAGNOSIS — Z3A35 35 weeks gestation of pregnancy: Secondary | ICD-10-CM

## 2016-02-26 DIAGNOSIS — O0990 Supervision of high risk pregnancy, unspecified, unspecified trimester: Secondary | ICD-10-CM

## 2016-02-26 LAB — OB RESULTS CONSOLE GC/CHLAMYDIA: GC PROBE AMP, GENITAL: NEGATIVE

## 2016-02-26 LAB — OB RESULTS CONSOLE GBS: STREP GROUP B AG: NEGATIVE

## 2016-02-26 NOTE — Progress Notes (Signed)
Patient is being seen at hospital weekly.

## 2016-02-26 NOTE — Addendum Note (Signed)
Addended by: Elby BeckPAUL, JANE F on: 02/26/2016 10:55 AM   Modules accepted: Orders

## 2016-02-26 NOTE — Progress Notes (Signed)
   PRENATAL VISIT NOTE  Subjective:  Katelyn Bauer is a 25 y.o. G2P1001 at 16100w6d being seen today for ongoing prenatal care.  She is currently monitored for the following issues for this high-risk pregnancy and has Supervision of high risk pregnancy, antepartum; H/O gestational diabetes in prior pregnancy, currently pregnant; GDM (gestational diabetes mellitus), class A1; and Polyhydramnios affecting pregnancy in third trimester on her problem list.  Patient reports no complaints.  Contractions: Irregular. Vag. Bleeding: None.  Movement: Present. Denies leaking of fluid.   The following portions of the patient's history were reviewed and updated as appropriate: allergies, current medications, past family history, past medical history, past social history, past surgical history and problem list. Problem list updated.  Objective:   Vitals:   02/26/16 1027  BP: 117/70  Pulse: 92  Weight: 177 lb 9.6 oz (80.6 kg)    Fetal Status:     Movement: Present     General:  Alert, oriented and cooperative. Patient is in no acute distress.  Skin: Skin is warm and dry. No rash noted.   Cardiovascular: Normal heart rate noted  Respiratory: Normal respiratory effort, no problems with respiration noted  Abdomen: Soft, gravid, appropriate for gestational age. Pain/Pressure: Present     Pelvic:  Cervical exam performed        Extremities: Normal range of motion.  Edema: None  Mental Status: Normal mood and affect. Normal behavior. Normal judgment and thought content.   Assessment and Plan:  Pregnancy: G2P1001 at 68100w6d  1. Supervision of high risk pregnancy, antepartum Patient is doing well without complaints Cultures today - Culture, beta strep (group b only) - GC/Chlamydia probe amp ([facility]  3. GDM (gestational diabetes mellitus), class A1 Patient has not been checking CBGs. She states she was not aware she needed to check them 4 times daily. At her best, she may check it once  daily Reviewed CBG monitoring recommendations. Reviewed its importance and the need to bring her log or meter with her at every visit in order for us to help optimize positive outcome of this pregnancy. Patient verbalized understanding  4. Polyhydramnios affecting pregnancy in third trimester Persistent poly on 11/27 scan Continue weekly BPP Plan for IOL at 39 weeks  Preterm labor symptoms and general obstetric precautions including but not limited to vaginal bleeding, contractions, leaking of fluid and fetal movement were reviewed in detail with the patient. Please refer to After Visit Summary for other counseling recommendations.  Return in about 1 week (around 03/04/2016).   Catalina AntiguaPeggy Jenisha Faison, MD

## 2016-02-27 LAB — GC/CHLAMYDIA PROBE AMP (~~LOC~~) NOT AT ARMC
Chlamydia: NEGATIVE
Neisseria Gonorrhea: NEGATIVE

## 2016-02-28 ENCOUNTER — Ambulatory Visit (HOSPITAL_COMMUNITY)
Admission: RE | Admit: 2016-02-28 | Discharge: 2016-02-28 | Disposition: A | Discharge status: Home / Self Care | Payer: Medicaid Other | Source: Ambulatory Visit | Attending: Certified Nurse Midwife | Admitting: Certified Nurse Midwife

## 2016-02-28 ENCOUNTER — Other Ambulatory Visit (HOSPITAL_COMMUNITY): Payer: Self-pay | Admitting: Maternal and Fetal Medicine

## 2016-02-28 ENCOUNTER — Encounter (HOSPITAL_COMMUNITY): Payer: Self-pay

## 2016-02-28 DIAGNOSIS — O24410 Gestational diabetes mellitus in pregnancy, diet controlled: Secondary | ICD-10-CM

## 2016-02-28 DIAGNOSIS — O403XX0 Polyhydramnios, third trimester, not applicable or unspecified: Secondary | ICD-10-CM | POA: Diagnosis not present

## 2016-02-28 DIAGNOSIS — Z3A36 36 weeks gestation of pregnancy: Secondary | ICD-10-CM | POA: Insufficient documentation

## 2016-02-28 DIAGNOSIS — O09293 Supervision of pregnancy with other poor reproductive or obstetric history, third trimester: Secondary | ICD-10-CM | POA: Diagnosis not present

## 2016-02-28 DIAGNOSIS — O409XX0 Polyhydramnios, unspecified trimester, not applicable or unspecified: Secondary | ICD-10-CM

## 2016-02-28 IMAGING — US US MFM FETAL BPP W/O NON-STRESS
1 series · 12 of 14 positions shown · non-contrast
Comparison: none

[Series 1: us mfm fetal bpp w/o non-stress · 14 acquisitions, 12 frames shown]
[im 1/14]
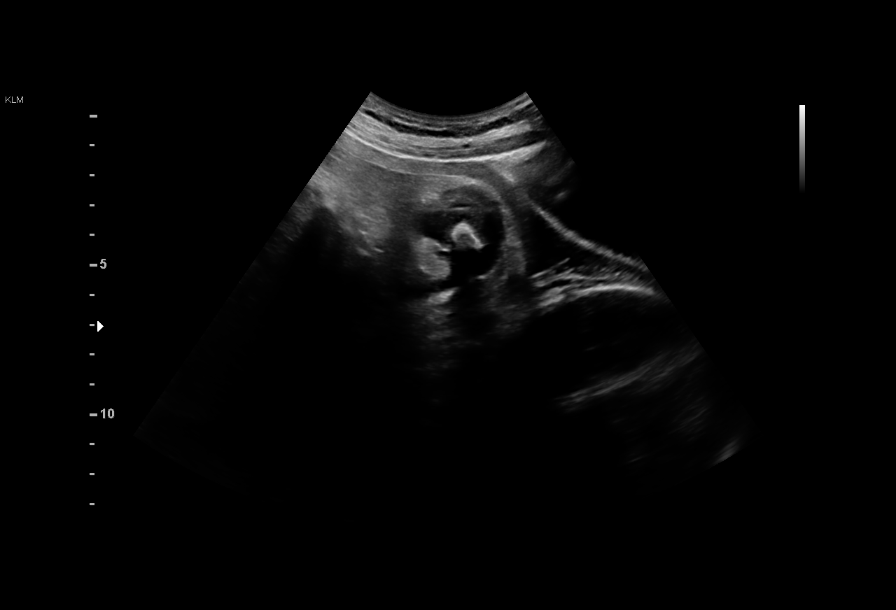
[im 2/14]
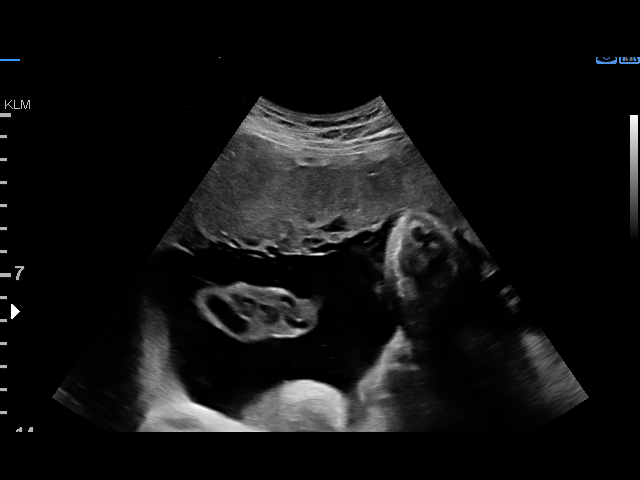
[im 3/14]
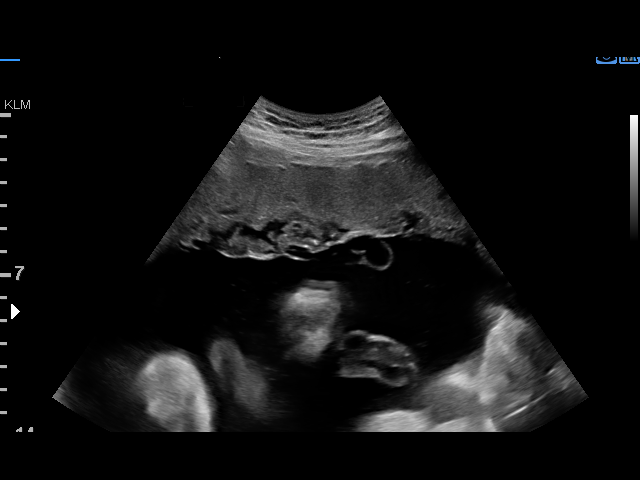
[im 5/14]
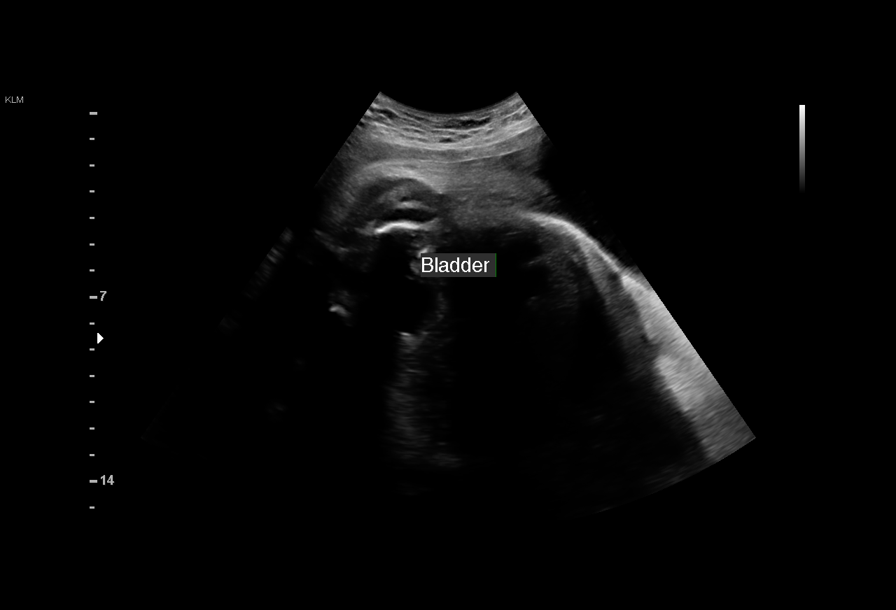
[im 6/14]
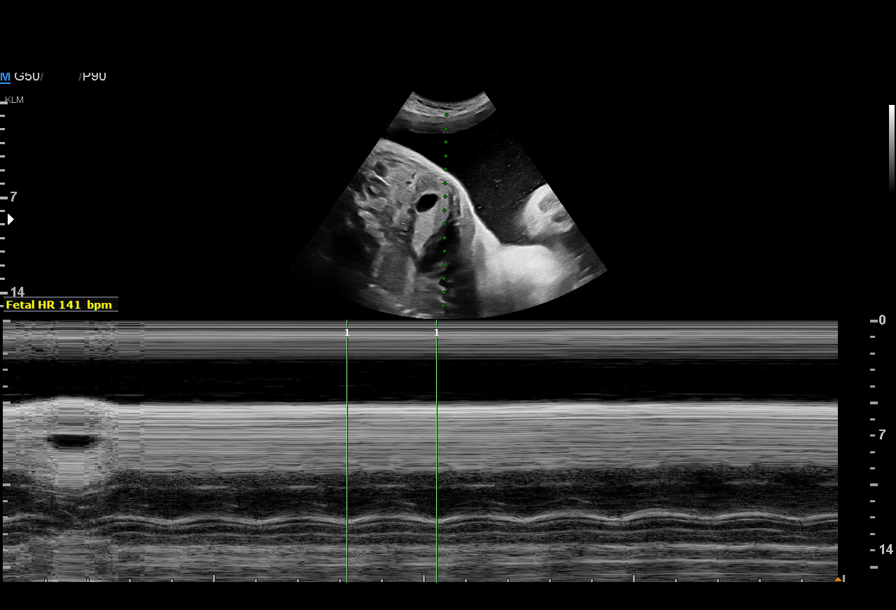
[im 7/14]
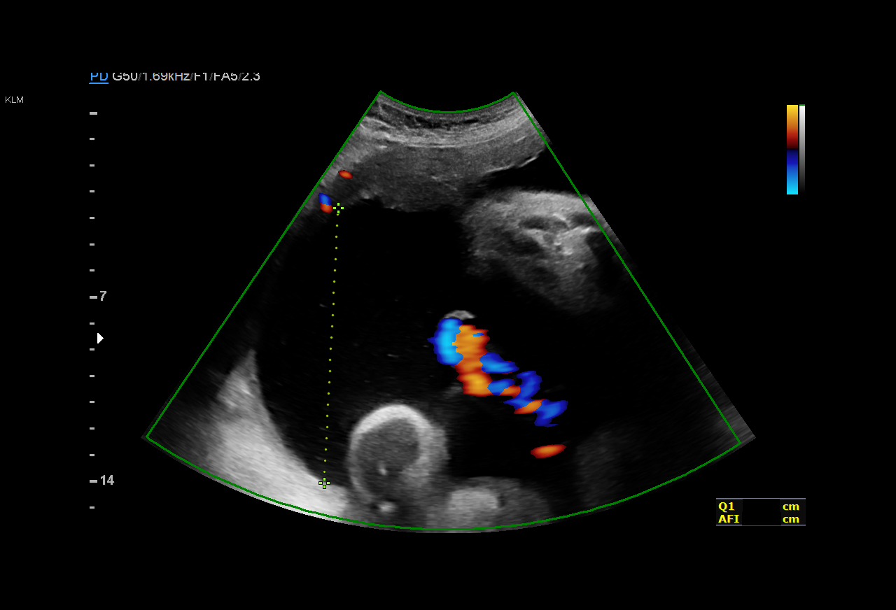
[im 8/14]
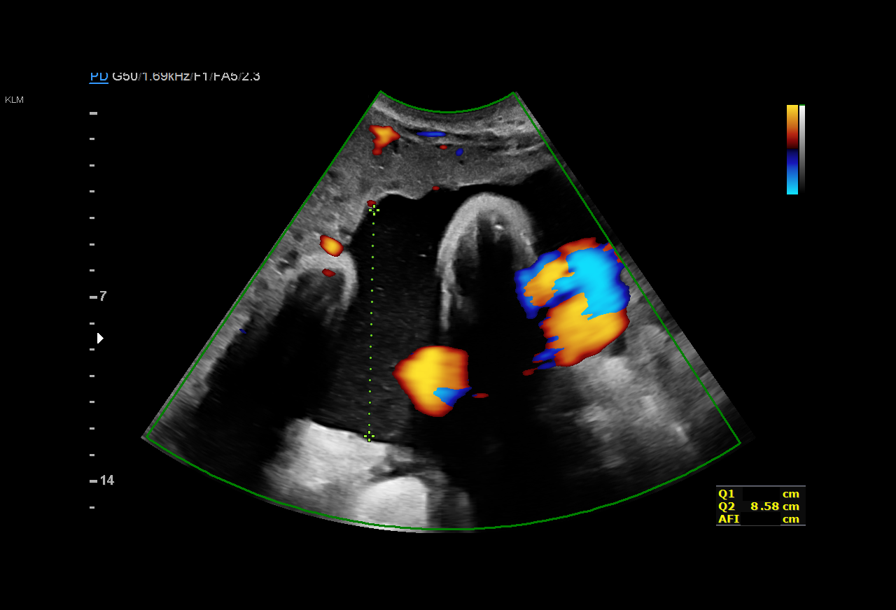
[im 9/14]
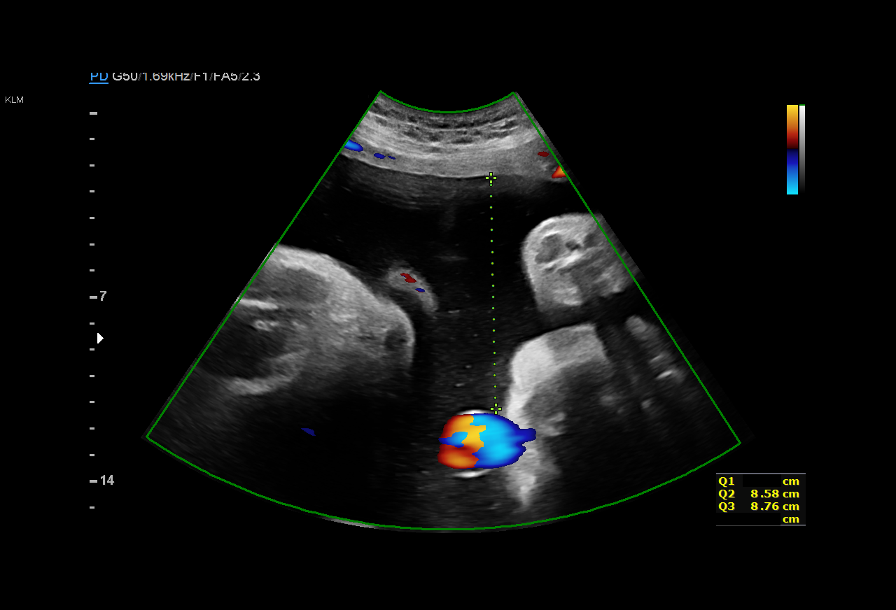
[im 10/14]
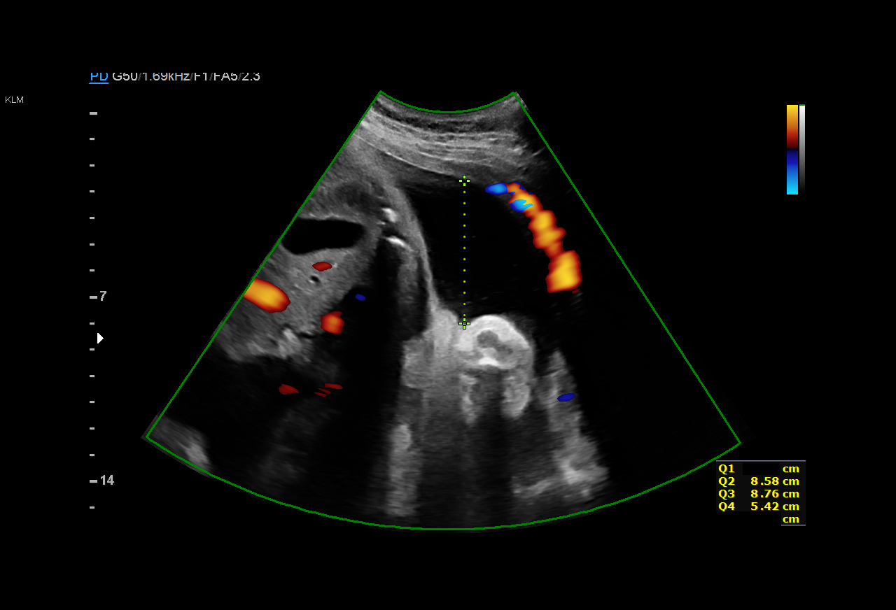
[im 12/14]
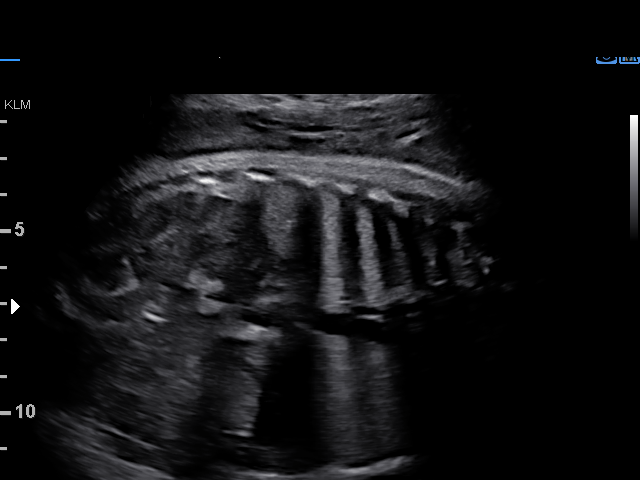
[im 13/14]
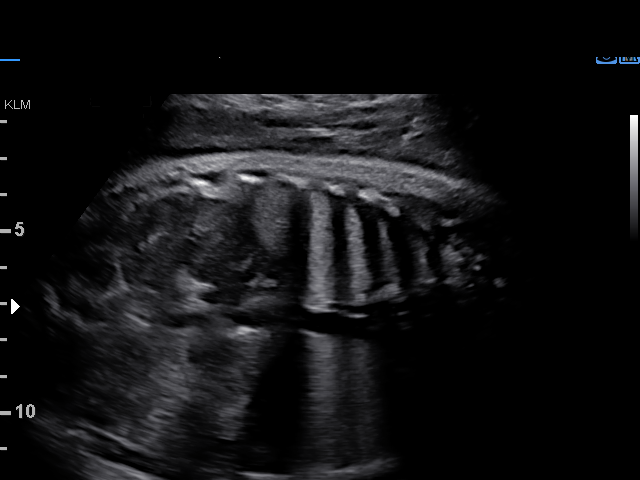
[im 14/14]
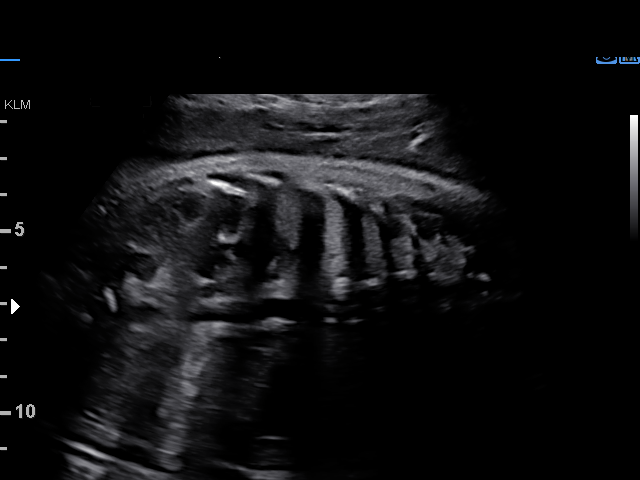

[12 of 14 positions shown; findings below may reference images not displayed]

Road [HOSPITAL]

1  [facility]            [PHONE_NUMBER]      [PHONE_NUMBER]     [PHONE_NUMBER]
Indications

36 weeks gestation of pregnancy
Poor obstetric history: Previous gestational   [5X]
diabetes (diet-controlled)
Polyhydramnios, third trimester, antepartum    [5X]
condition or complication, unspecified fetus
Gestational diabetes in pregnancy, diet        [5X]
controlled (borderline)
OB History

Gravidity:    2         Term:   1
Living:       1
Fetal Evaluation

Num Of Fetuses:     1
Fetal Heart         141
Rate(bpm):
Cardiac Activity:   Observed
Presentation:       Cephalic

Amniotic Fluid
AFI FV:      Polyhydramnios

AFI Sum(cm)     %Tile       Largest Pocket(cm)
33.21           > 97

RUQ(cm)       RLQ(cm)       LUQ(cm)        LLQ(cm)
10.45

Comment:    [DATE] BPP in 11 mins.
Biophysical Evaluation

Amniotic F.V:   Polyhydramnios             F. Tone:         Observed
F. Movement:    Observed                   Score:           [DATE]
F. Breathing:   Observed
Gestational Age

LMP:           36w 1d        Date:  [DATE]                 EDD:    [DATE]
Best:          36w 1d     Det. By:  LMP  ([DATE])          EDD:    [DATE]
Impression

SIUP at 36+1 weeks
Cephalic presentation
Mild polyhydramnios
BPP [DATE]
Recommendations

Continue twice weekly NSTs with weekly AFIs or weekly BPPs

## 2016-02-29 ENCOUNTER — Other Ambulatory Visit (HOSPITAL_COMMUNITY): Payer: Self-pay | Admitting: *Deleted

## 2016-02-29 DIAGNOSIS — O409XX0 Polyhydramnios, unspecified trimester, not applicable or unspecified: Secondary | ICD-10-CM

## 2016-03-01 LAB — STREP GP B CULTURE+RFLX: STREP GP B CULTURE+RFLX: NEGATIVE

## 2016-03-04 ENCOUNTER — Ambulatory Visit (INDEPENDENT_AMBULATORY_CARE_PROVIDER_SITE_OTHER): Payer: Medicaid Other | Admitting: Obstetrics and Gynecology

## 2016-03-04 VITALS — BP 115/69 | HR 86 | Wt 178.4 lb

## 2016-03-04 DIAGNOSIS — O24410 Gestational diabetes mellitus in pregnancy, diet controlled: Secondary | ICD-10-CM

## 2016-03-04 DIAGNOSIS — O0990 Supervision of high risk pregnancy, unspecified, unspecified trimester: Secondary | ICD-10-CM

## 2016-03-04 DIAGNOSIS — O403XX0 Polyhydramnios, third trimester, not applicable or unspecified: Secondary | ICD-10-CM

## 2016-03-04 NOTE — Progress Notes (Signed)
   PRENATAL VISIT NOTE  Subjective:  [name] is a 25 y.o. G2P1001 at 7163w6d being seen today for ongoing prenatal care.  She is currently monitored for the following issues for this high-risk pregnancy and has Supervision of high risk pregnancy, antepartum; H/O gestational diabetes in prior pregnancy, currently pregnant; GDM (gestational diabetes mellitus), class A1; and Polyhydramnios affecting pregnancy in third trimester on her problem list.  Patient reports no complaints.  Contractions: Irregular. Vag. Bleeding: None, Scant.  Movement: Present. Denies leaking of fluid.   The following portions of the patient's history were reviewed and updated as appropriate: allergies, current medications, past family history, past medical history, past social history, past surgical history and problem list. Problem list updated.  Objective:   Vitals:   03/04/16 1029  BP: 115/69  Pulse: 86  Weight: 178 lb 6.4 oz (80.9 kg)    Fetal Status: Fetal Heart Rate (bpm): 134 Fundal Height: 37 cm Movement: Present  Presentation: Vertex  General:  Alert, oriented and cooperative. Patient is in no acute distress.  Skin: Skin is warm and dry. No rash noted.   Cardiovascular: Normal heart rate noted  Respiratory: Normal respiratory effort, no problems with respiration noted  Abdomen: Soft, gravid, appropriate for gestational age. Pain/Pressure: Present     Pelvic:  Cervical exam performed Dilation: 1 Effacement (%): Thick Station: -3  Extremities: Normal range of motion.  Edema: None  Mental Status: Normal mood and affect. Normal behavior. Normal judgment and thought content.   Assessment and Plan:  Pregnancy: G2P1001 at 2863w6d  1. GDM (gestational diabetes mellitus), class A1 Patient did not bring CBG log or meter. She does not remember what her values have been I again stressed the importance of us working together in managing her GDM  2. Supervision of high risk pregnancy, antepartum Patient is  doing well without complaints Reviewed culture results  3. Polyhydramnios affecting pregnancy in third trimester Continue weekly BPP Will schedule IOL at 39 weeks  Preterm labor symptoms and general obstetric precautions including but not limited to vaginal bleeding, contractions, leaking of fluid and fetal movement were reviewed in detail with the patient. Please refer to After Visit Summary for other counseling recommendations.  No Follow-up on file.   Catalina AntiguaPeggy Sarahy Creedon, MD

## 2016-03-06 ENCOUNTER — Ambulatory Visit (HOSPITAL_COMMUNITY)
Admission: RE | Admit: 2016-03-06 | Discharge: 2016-03-06 | Disposition: A | Discharge status: Home / Self Care | Payer: Medicaid Other | Source: Ambulatory Visit | Attending: Certified Nurse Midwife | Admitting: Certified Nurse Midwife

## 2016-03-06 ENCOUNTER — Encounter (HOSPITAL_COMMUNITY): Payer: Self-pay

## 2016-03-06 DIAGNOSIS — Z3A37 37 weeks gestation of pregnancy: Secondary | ICD-10-CM | POA: Diagnosis not present

## 2016-03-06 DIAGNOSIS — O409XX0 Polyhydramnios, unspecified trimester, not applicable or unspecified: Secondary | ICD-10-CM

## 2016-03-06 DIAGNOSIS — O403XX0 Polyhydramnios, third trimester, not applicable or unspecified: Secondary | ICD-10-CM | POA: Diagnosis not present

## 2016-03-06 DIAGNOSIS — O24410 Gestational diabetes mellitus in pregnancy, diet controlled: Secondary | ICD-10-CM | POA: Diagnosis not present

## 2016-03-06 DIAGNOSIS — O09293 Supervision of pregnancy with other poor reproductive or obstetric history, third trimester: Secondary | ICD-10-CM | POA: Diagnosis present

## 2016-03-06 IMAGING — US US MFM OB FOLLOW-UP
2 series · 14 of 28 positions shown · non-contrast
Comparison: none

[Series 1: us mfm ob follow-up · 27 acquisitions, 12 frames shown (1 of 2)]
[im 2/27]
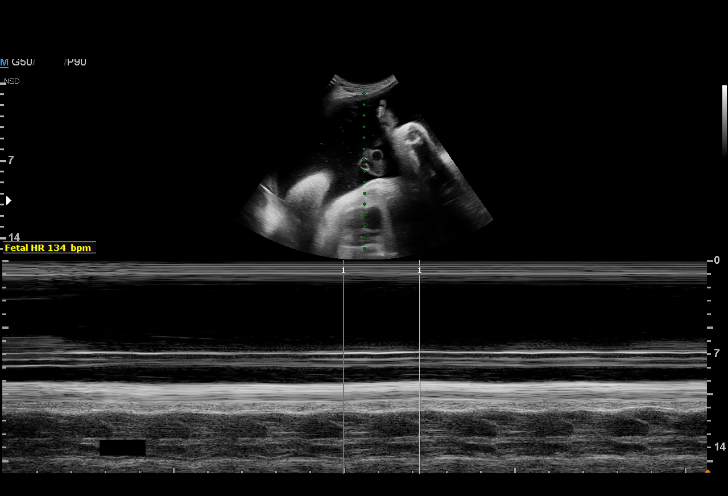
[im 4/27]
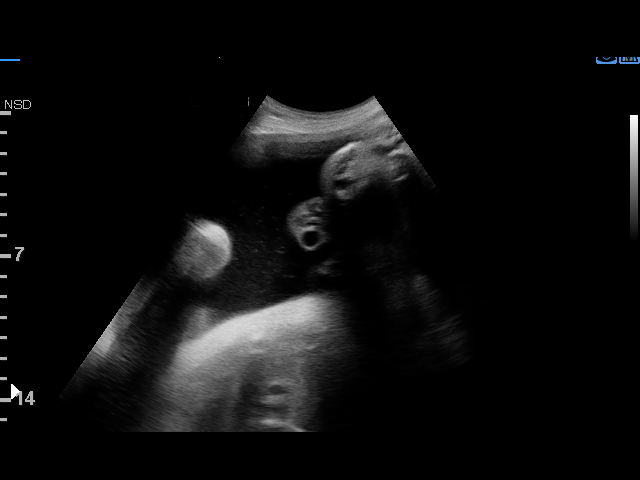
[im 6/27]
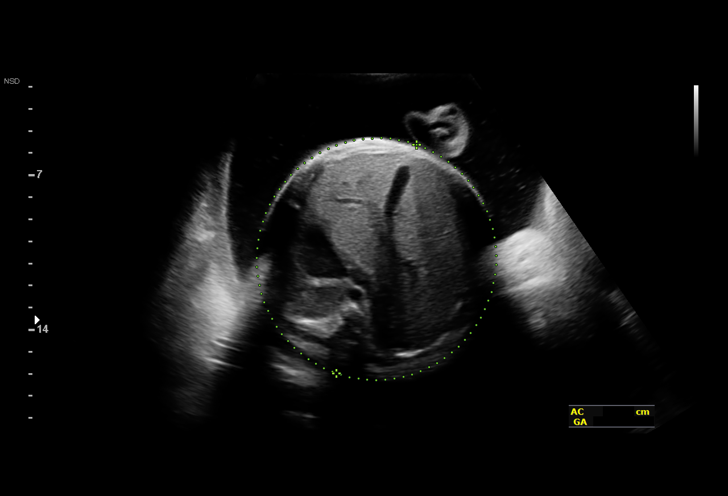
[im 8/27]
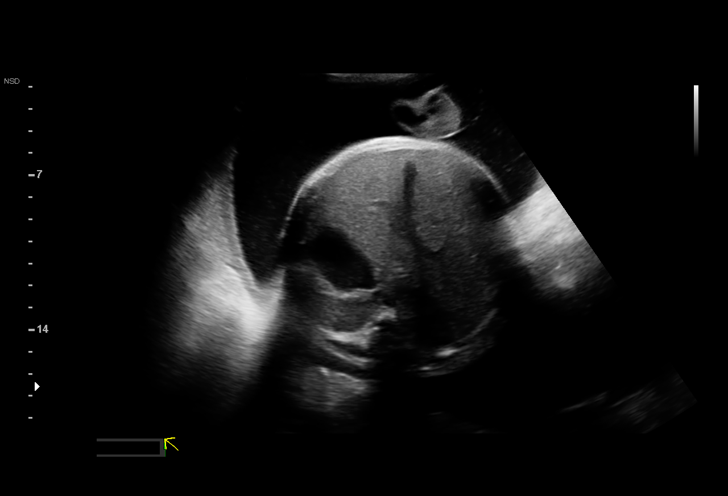
[im 10/27]
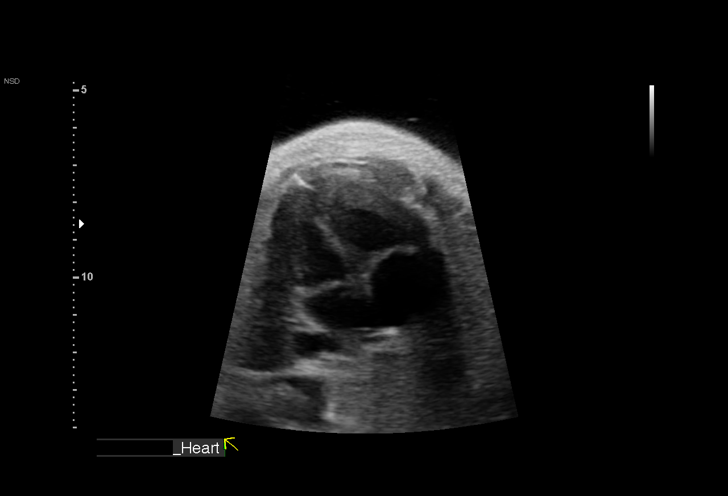
[im 12/27]
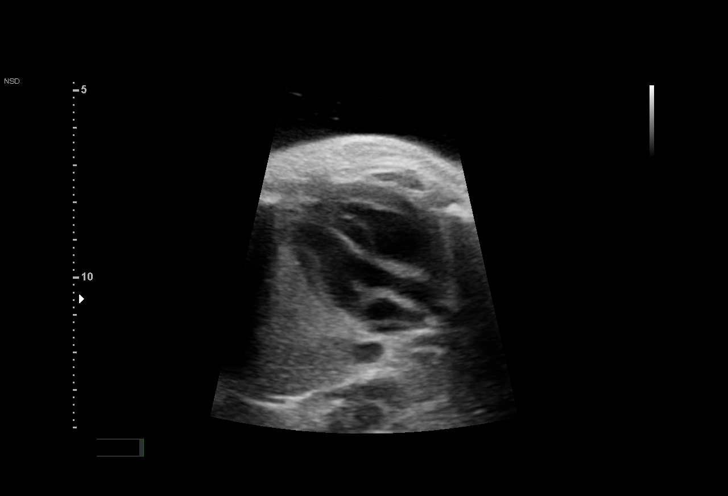
[im 15/27]
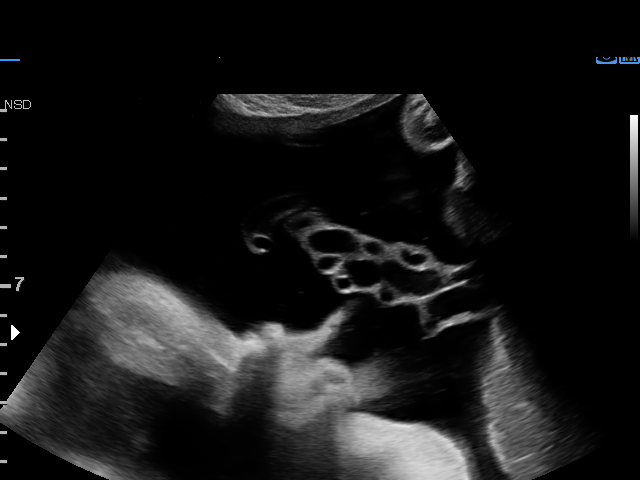
[im 17/27]
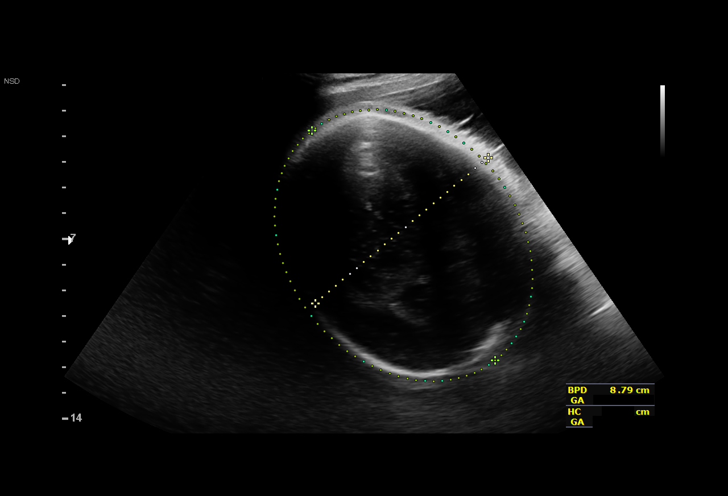
[im 19/27]
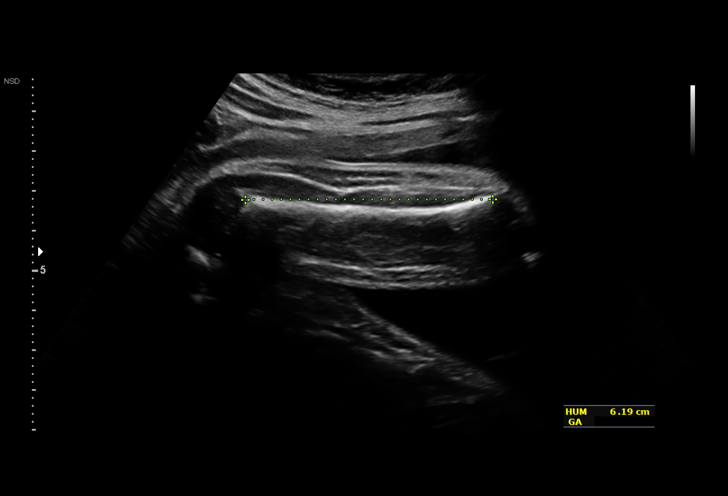
[im 21/27]
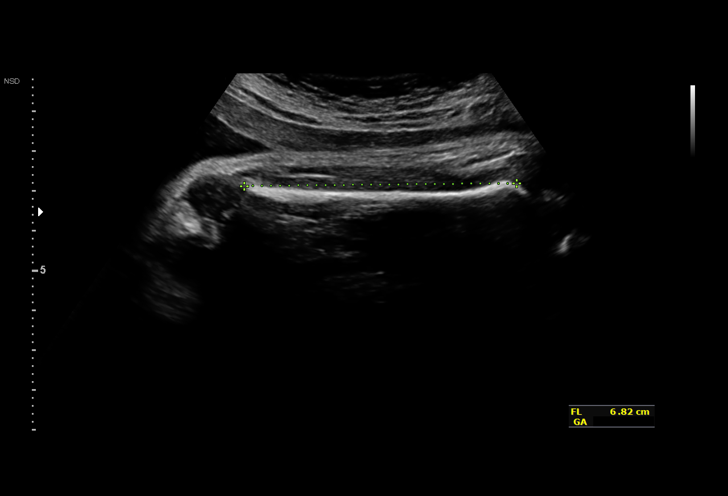
[im 23/27]
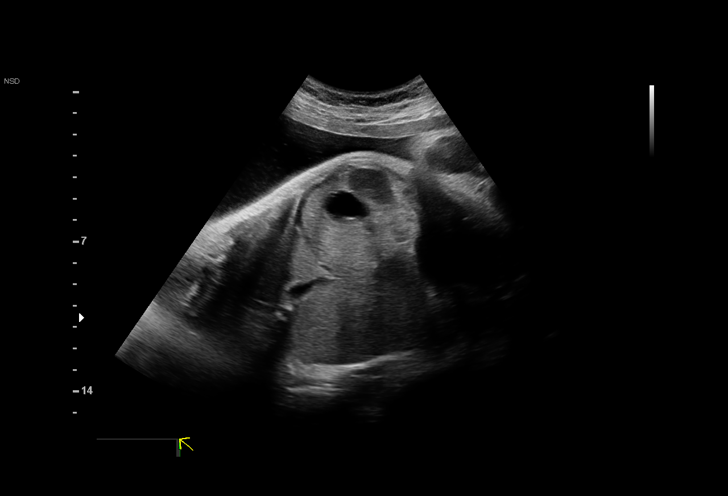
[im 25/27]
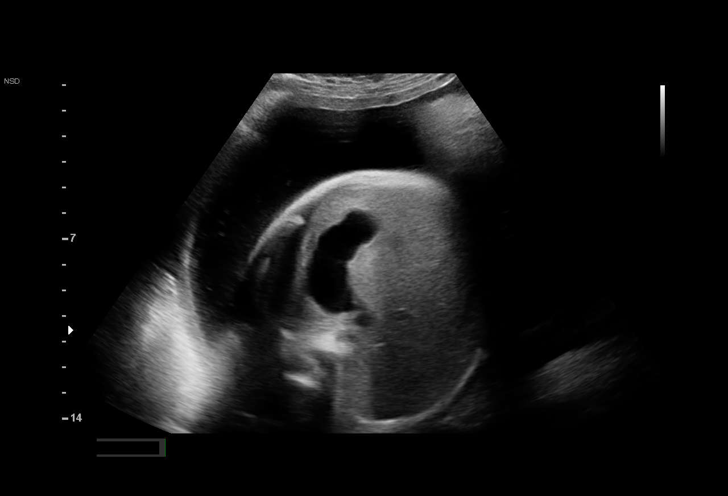

[Series 3: us mfm ob follow-up · 3 acquisitions, 2 frames shown (2 of 2)]
[im 1/3]
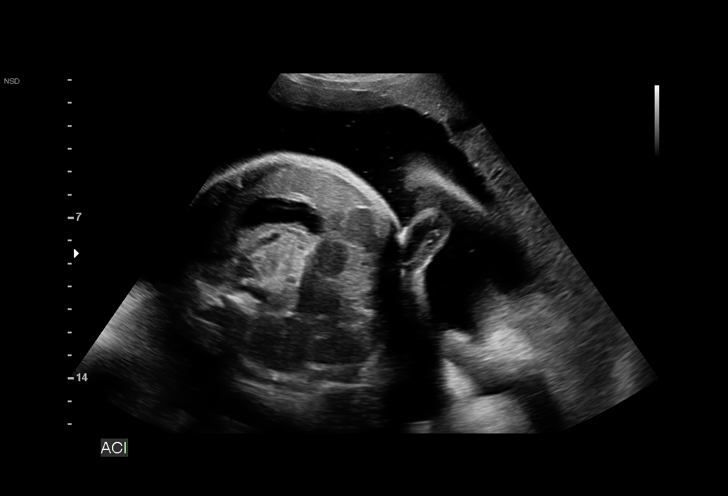
[im 3/3]
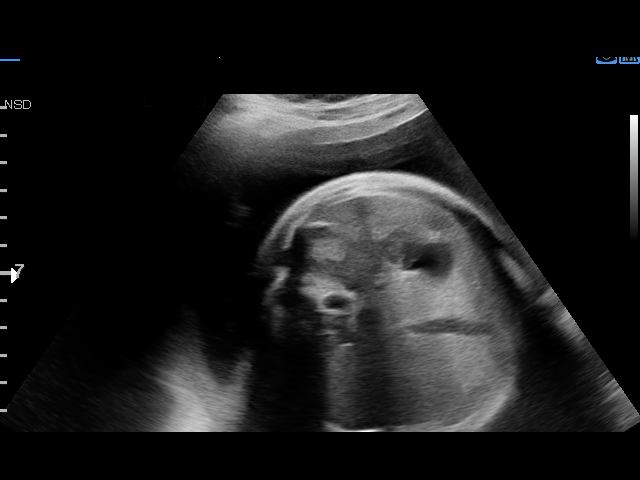

[14 of 28 positions shown; findings below may reference images not displayed]

Road [HOSPITAL]

1  [facility]            [PHONE_NUMBER]      [PHONE_NUMBER]     [PHONE_NUMBER]
2  [facility]            [PHONE_NUMBER]      [PHONE_NUMBER]     [PHONE_NUMBER]
Indications

37 weeks gestation of pregnancy
Poor obstetric history: Previous gestational   [6S]
diabetes (diet-controlled)
Polyhydramnios, third trimester, antepartum    [6S]
condition or complication, unspecified fetus
Gestational diabetes in pregnancy, diet        [6S]
controlled (borderline)
OB History

Gravidity:    2         Term:   1
Living:       1
Fetal Evaluation

Num Of Fetuses:     1
Fetal Heart         134
Rate(bpm):
Cardiac Activity:   Observed
Presentation:       Cephalic
Placenta:           Anterior Fundal, above cervical os
P. Cord Insertion:  Previously Visualized

Amniotic Fluid
AFI FV:      Polyhydramnios

AFI Sum(cm)     %Tile       Largest Pocket(cm)
36.4            > 97

RUQ(cm)       RLQ(cm)       LUQ(cm)        LLQ(cm)
12.85
Biophysical Evaluation

Amniotic F.V:   Polyhydramnios             F. Tone:        Observed
F. Movement:    Observed                   Score:          [DATE]
F. Breathing:   Observed
Biometry

BPD:      88.3  mm     G. Age:  35w 5d         26  %    CI:        75.56   %   70 - 86
FL/HC:      21.2   %   20.8 -
HC:      322.1  mm     G. Age:  36w 3d         12  %    HC/AC:      0.94       0.92 -
AC:      341.2  mm     G. Age:  38w 0d         84  %    FL/BPD:     77.3   %   71 - 87
FL:       68.3  mm     G. Age:  35w 0d          8  %    FL/AC:      20.0   %   20 - 24
HUM:        62  mm     G. Age:  35w 6d         48  %

Est. FW:    [6S]  gm    6 lb 12 oz      64  %
Gestational Age

LMP:           37w 1d       Date:   [DATE]                 EDD:   [DATE]
U/S Today:     36w 2d                                        EDD:   [DATE]
Best:          37w 1d    Det. By:   LMP  ([DATE])          EDD:   [DATE]
Anatomy

Cranium:               Appears normal         Aortic Arch:            Previously seen
Cavum:                 Previously seen        Ductal Arch:            Previously seen
Ventricles:            Previously seen        Diaphragm:              Appears normal
Choroid Plexus:        Not well visualized    Stomach:                Appears normal, left
sided
Cerebellum:            Not well visualized    Abdomen:                Appears normal
Posterior Fossa:       Not well visualized    Abdominal Wall:         Appears nml (cord
insert, abd wall)
Nuchal Fold:           Not applicable (>20    Cord Vessels:           Previously seen
wks GA)
Face:                  Orbits and profile     Kidneys:                Appear normal
previously seen
Lips:                  Previously seen        Bladder:                Appears normal
Thoracic:              Appears normal         Spine:                  Not well visualized
Heart:                 Appears normal         Upper Extremities:      Previously seen
(4CH, axis, and situs
RVOT:                  Appears normal         Lower Extremities:      Previously seen
LVOT:                  Appears normal

Other:  Female gender previously seen. Technically difficult due to advanced
GA and fetal position.
Cervix Uterus Adnexa

Cervix
Not visualized (advanced GA >[6S])
Impression

Singleton intrauterine pregnancy at 37+1 weeks with
gestational diabetes and polyhydramnios
Review of the anatomy shows no sonographic markers for
aneuploidy or structural anomalies
Amniotic fluid volume remains elevatedl with an AFI of 36.4cm
BPP is [DATE]
Estimated fetal weight is 3056g which is growth in the 64th
percentile
Recommendations

Continue antepartum testing as ordered. Plan for delivery as
soon after 39+0 as is practical

## 2016-03-07 ENCOUNTER — Ambulatory Visit (HOSPITAL_COMMUNITY): Admission: RE | Admit: 2016-03-07 | Payer: Medicaid Other | Source: Ambulatory Visit

## 2016-03-11 ENCOUNTER — Ambulatory Visit (INDEPENDENT_AMBULATORY_CARE_PROVIDER_SITE_OTHER): Payer: Medicaid Other | Admitting: Obstetrics and Gynecology

## 2016-03-11 VITALS — BP 117/66 | HR 87 | Wt 178.0 lb

## 2016-03-11 DIAGNOSIS — O403XX0 Polyhydramnios, third trimester, not applicable or unspecified: Secondary | ICD-10-CM

## 2016-03-11 DIAGNOSIS — O24410 Gestational diabetes mellitus in pregnancy, diet controlled: Secondary | ICD-10-CM | POA: Diagnosis not present

## 2016-03-11 DIAGNOSIS — O0990 Supervision of high risk pregnancy, unspecified, unspecified trimester: Secondary | ICD-10-CM

## 2016-03-11 NOTE — Progress Notes (Signed)
   PRENATAL VISIT NOTE  Subjective:  Katelyn BoschLeona Bauer is a 25 y.o. G2P1001 at 5271w6d being seen today for ongoing prenatal care.  She is currently monitored for the following issues for this high-risk pregnancy and has Supervision of high risk pregnancy, antepartum; H/O gestational diabetes in prior pregnancy, currently pregnant; GDM (gestational diabetes mellitus), class A1; and Polyhydramnios affecting pregnancy in third trimester on her problem list.  Patient reports no complaints.  Contractions: Irregular. Vag. Bleeding: None.  Movement: Present. Denies leaking of fluid.   The following portions of the patient's history were reviewed and updated as appropriate: allergies, current medications, past family history, past medical history, past social history, past surgical history and problem list. Problem list updated.  Objective:   Vitals:   03/11/16 1056  BP: 117/66  Pulse: 87  Weight: 178 lb (80.7 kg)    Fetal Status: Fetal Heart Rate (bpm): 134 Fundal Height: 38 cm Movement: Present  Presentation: Vertex  General:  Alert, oriented and cooperative. Patient is in no acute distress.  Skin: Skin is warm and dry. No rash noted.   Cardiovascular: Normal heart rate noted  Respiratory: Normal respiratory effort, no problems with respiration noted  Abdomen: Soft, gravid, appropriate for gestational age. Pain/Pressure: Present     Pelvic:  Cervical exam performed Dilation: 1.5 Effacement (%): Thick Station: -3  Extremities: Normal range of motion.  Edema: None  Mental Status: Normal mood and affect. Normal behavior. Normal judgment and thought content.   Assessment and Plan:  Pregnancy: G2P1001 at 6071w6d  1. Supervision of high risk pregnancy, antepartum Patient is doing well without complaints  2. Polyhydramnios affecting pregnancy in third trimester Follow up weekly BPP scheduled on 12/20 IOL at 39 weeks  3. GDM (gestational diabetes mellitus), class A1 Patient did not bring meter  or log and reports fasting in the 50's without symptoms and pp as high as 137 Normal growth on 12/13 Follow up BPP on 12/20 IOL at 39 weeks due to poly  Term labor symptoms and general obstetric precautions including but not limited to vaginal bleeding, contractions, leaking of fluid and fetal movement were reviewed in detail with the patient. Please refer to After Visit Summary for other counseling recommendations.  Return in about 6 weeks (around 04/22/2016) for pp visit.   Catalina AntiguaPeggy Kharson Rasmusson, MD

## 2016-03-11 NOTE — Progress Notes (Signed)
Pt states ctx's were very intense last night for couple hrs then tapered off. +FM, denies Vb and leaking fluid. Pt requests cx check today.

## 2016-03-12 ENCOUNTER — Encounter (HOSPITAL_COMMUNITY): Payer: Self-pay | Admitting: *Deleted

## 2016-03-12 ENCOUNTER — Telehealth (HOSPITAL_COMMUNITY): Payer: Self-pay | Admitting: *Deleted

## 2016-03-12 NOTE — Telephone Encounter (Signed)
Preadmission screen  

## 2016-03-13 ENCOUNTER — Ambulatory Visit (HOSPITAL_COMMUNITY)
Admission: RE | Admit: 2016-03-13 | Discharge: 2016-03-13 | Disposition: A | Discharge status: Home / Self Care | Payer: Medicaid Other | Source: Ambulatory Visit | Attending: Certified Nurse Midwife | Admitting: Certified Nurse Midwife

## 2016-03-13 ENCOUNTER — Encounter (HOSPITAL_COMMUNITY): Payer: Self-pay

## 2016-03-13 DIAGNOSIS — O409XX0 Polyhydramnios, unspecified trimester, not applicable or unspecified: Secondary | ICD-10-CM

## 2016-03-13 DIAGNOSIS — Z3A38 38 weeks gestation of pregnancy: Secondary | ICD-10-CM | POA: Diagnosis not present

## 2016-03-13 DIAGNOSIS — O403XX0 Polyhydramnios, third trimester, not applicable or unspecified: Secondary | ICD-10-CM | POA: Insufficient documentation

## 2016-03-13 DIAGNOSIS — O09293 Supervision of pregnancy with other poor reproductive or obstetric history, third trimester: Secondary | ICD-10-CM | POA: Diagnosis not present

## 2016-03-19 ENCOUNTER — Inpatient Hospital Stay (HOSPITAL_COMMUNITY)
Admission: RE | Admit: 2016-03-19 | Discharge: 2016-03-21 | DRG: 775 | Disposition: A | Discharge status: Home / Self Care | Payer: Medicaid Other | Source: Ambulatory Visit | Attending: Family Medicine | Admitting: Family Medicine

## 2016-03-19 ENCOUNTER — Inpatient Hospital Stay (HOSPITAL_COMMUNITY): Payer: Medicaid Other | Admitting: Anesthesiology

## 2016-03-19 ENCOUNTER — Encounter: Payer: Medicaid Other | Admitting: Obstetrics and Gynecology

## 2016-03-19 ENCOUNTER — Encounter (HOSPITAL_COMMUNITY): Payer: Self-pay

## 2016-03-19 DIAGNOSIS — O403XX0 Polyhydramnios, third trimester, not applicable or unspecified: Principal | ICD-10-CM | POA: Diagnosis present

## 2016-03-19 DIAGNOSIS — O409XX0 Polyhydramnios, unspecified trimester, not applicable or unspecified: Secondary | ICD-10-CM | POA: Diagnosis present

## 2016-03-19 DIAGNOSIS — O24420 Gestational diabetes mellitus in childbirth, diet controlled: Secondary | ICD-10-CM | POA: Diagnosis present

## 2016-03-19 DIAGNOSIS — Z3A39 39 weeks gestation of pregnancy: Secondary | ICD-10-CM | POA: Diagnosis not present

## 2016-03-19 DIAGNOSIS — Z87891 Personal history of nicotine dependence: Secondary | ICD-10-CM | POA: Diagnosis not present

## 2016-03-19 LAB — ABO/RH: ABO/RH(D): B POS

## 2016-03-19 LAB — TYPE AND SCREEN
ABO/RH(D): B POS
Antibody Screen: NEGATIVE

## 2016-03-19 LAB — CBC
HCT: 27.7 % — ABNORMAL LOW (ref 36.0–46.0)
Hemoglobin: 8.8 g/dL — ABNORMAL LOW (ref 12.0–15.0)
MCH: 19.3 pg — AB (ref 26.0–34.0)
MCHC: 31.8 g/dL (ref 30.0–36.0)
MCV: 60.7 fL — ABNORMAL LOW (ref 78.0–100.0)
PLATELETS: 231 10*3/uL (ref 150–400)
RBC: 4.56 MIL/uL (ref 3.87–5.11)
RDW: 15.7 % — ABNORMAL HIGH (ref 11.5–15.5)
WBC: 8.5 10*3/uL (ref 4.0–10.5)

## 2016-03-19 MED ORDER — SOD CITRATE-CITRIC ACID 500-334 MG/5ML PO SOLN: 30.0000 mL | ORAL | Status: DC | PRN

## 2016-03-19 MED ORDER — LACTATED RINGERS IV SOLN
INTRAVENOUS | Status: DC
  Administered 2016-03-19: 08:00:00 via INTRAVENOUS

## 2016-03-19 MED ORDER — EPHEDRINE 5 MG/ML INJ
10.0000 mg | INTRAVENOUS | Status: DC | PRN
  Filled 2016-03-19: qty 4

## 2016-03-19 MED ORDER — LACTATED RINGERS IV SOLN: 500.0000 mL | Freq: Once | INTRAVENOUS | Status: DC

## 2016-03-19 MED ORDER — OXYTOCIN BOLUS FROM INFUSION
500.0000 mL | Freq: Once | INTRAVENOUS | Status: AC
  Administered 2016-03-20: 500 mL via INTRAVENOUS

## 2016-03-19 MED ORDER — PHENYLEPHRINE 40 MCG/ML (10ML) SYRINGE FOR IV PUSH (FOR BLOOD PRESSURE SUPPORT)
80.0000 ug | PREFILLED_SYRINGE | INTRAVENOUS | Status: DC | PRN
  Filled 2016-03-19: qty 10
  Filled 2016-03-19: qty 5

## 2016-03-19 MED ORDER — DIPHENHYDRAMINE HCL 50 MG/ML IJ SOLN: 12.5000 mg | INTRAMUSCULAR | Status: DC | PRN

## 2016-03-19 MED ORDER — LIDOCAINE HCL (PF) 1 % IJ SOLN
30.0000 mL | INTRAMUSCULAR | Status: DC | PRN
  Filled 2016-03-19: qty 30

## 2016-03-19 MED ORDER — OXYTOCIN 40 UNITS IN LACTATED RINGERS INFUSION - SIMPLE MED
1.0000 m[IU]/min | INTRAVENOUS | Status: DC
  Administered 2016-03-19: 2 m[IU]/min via INTRAVENOUS
  Filled 2016-03-19: qty 1000

## 2016-03-19 MED ORDER — FENTANYL 2.5 MCG/ML BUPIVACAINE 1/10 % EPIDURAL INFUSION (WH - ANES)
14.0000 mL/h | INTRAMUSCULAR | Status: DC | PRN
  Administered 2016-03-19: 12 mL/h via EPIDURAL
  Administered 2016-03-20: 14 mL/h via EPIDURAL
  Filled 2016-03-19 (×2): qty 100

## 2016-03-19 MED ORDER — LACTATED RINGERS IV SOLN: 500.0000 mL | INTRAVENOUS | Status: DC | PRN

## 2016-03-19 MED ORDER — ACETAMINOPHEN 325 MG PO TABS: 650.0000 mg | ORAL_TABLET | ORAL | Status: DC | PRN

## 2016-03-19 MED ORDER — LIDOCAINE HCL (PF) 1 % IJ SOLN
INTRAMUSCULAR | Status: DC | PRN
  Administered 2016-03-19: 3 mL
  Administered 2016-03-19: 2 mL
  Administered 2016-03-19: 5 mL

## 2016-03-19 MED ORDER — MISOPROSTOL 200 MCG PO TABS
50.0000 ug | ORAL_TABLET | ORAL | Status: DC
  Administered 2016-03-19: 50 ug via ORAL
  Filled 2016-03-19: qty 0.5

## 2016-03-19 MED ORDER — OXYCODONE-ACETAMINOPHEN 5-325 MG PO TABS: 1.0000 | ORAL_TABLET | ORAL | Status: DC | PRN

## 2016-03-19 MED ORDER — OXYTOCIN 40 UNITS IN LACTATED RINGERS INFUSION - SIMPLE MED: 2.5000 [IU]/h | INTRAVENOUS | Status: DC

## 2016-03-19 MED ORDER — EPHEDRINE 5 MG/ML INJ: 10.0000 mg | INTRAVENOUS | Status: DC | PRN

## 2016-03-19 MED ORDER — ONDANSETRON HCL 4 MG/2ML IJ SOLN
4.0000 mg | Freq: Four times a day (QID) | INTRAMUSCULAR | Status: DC | PRN
  Administered 2016-03-20: 4 mg via INTRAVENOUS
  Filled 2016-03-19: qty 2

## 2016-03-19 MED ORDER — OXYCODONE-ACETAMINOPHEN 5-325 MG PO TABS: 2.0000 | ORAL_TABLET | ORAL | Status: DC | PRN

## 2016-03-19 MED ORDER — PHENYLEPHRINE 40 MCG/ML (10ML) SYRINGE FOR IV PUSH (FOR BLOOD PRESSURE SUPPORT)
80.0000 ug | PREFILLED_SYRINGE | INTRAVENOUS | Status: DC | PRN
  Filled 2016-03-19: qty 5
  Filled 2016-03-19: qty 10

## 2016-03-19 MED ORDER — PHENYLEPHRINE 40 MCG/ML (10ML) SYRINGE FOR IV PUSH (FOR BLOOD PRESSURE SUPPORT): 80.0000 ug | PREFILLED_SYRINGE | INTRAVENOUS | Status: DC | PRN

## 2016-03-19 MED ORDER — TERBUTALINE SULFATE 1 MG/ML IJ SOLN
0.2500 mg | Freq: Once | INTRAMUSCULAR | Status: DC | PRN
Start: 2016-03-19 — End: 2016-03-20
  Filled 2016-03-19: qty 1

## 2016-03-19 NOTE — Anesthesia Procedure Notes (Signed)
Epidural Patient location during procedure: OB  Staffing Anesthesiologist: Marcene DuosFITZGERALD, Cadyn Rodger Performed: anesthesiologist   Preanesthetic Checklist Completed: patient identified, site marked, surgical consent, pre-op evaluation, timeout performed, IV checked, risks and benefits discussed and monitors and equipment checked  Epidural Patient position: sitting Prep: site prepped and draped and DuraPrep Patient monitoring: continuous pulse ox and blood pressure Approach: midline Location: L4-L5 Injection technique: LOR air  Needle:  Needle type: Tuohy  Needle gauge: 17 G Needle length: 9 cm and 9 Needle insertion depth: 6 cm Catheter type: closed end flexible Catheter size: 19 Gauge Catheter at skin depth: 11 cm Test dose: negative  Assessment Events: blood not aspirated, injection not painful, no injection resistance, negative IV test and no paresthesia

## 2016-03-19 NOTE — Anesthesia Pain Management Evaluation Note (Signed)
  CRNA Pain Management Visit Note  Patient: Katelyn Bauer, 25 y.o., female  "Hello I am a member of the anesthesia team at [facility]. We have an anesthesia team available at all times to provide care throughout the hospital, including epidural management and anesthesia for C-section. I don't know your plan for the delivery whether it a natural birth, water birth, IV sedation, nitrous supplementation, doula or epidural, but we want to meet your pain goals."   1.Was your pain managed to your expectations on prior hospitalizations?   Yes   2.What is your expectation for pain management during this hospitalization?     Epidural  3.How can we help you reach that goal? epidural  Record the patient's initial score and the patient's pain goal.   Pain: 1  Pain Goal: 4 [facility] wants you to be able to say your pain was always managed very well.  Sabra Sessler 03/19/2016

## 2016-03-19 NOTE — Anesthesia Preprocedure Evaluation (Signed)
Anesthesia Evaluation  Patient identified by MRN, date of birth, ID band Patient awake    Reviewed: Allergy & Precautions, Patient's Chart, lab work & pertinent test results  Airway Mallampati: II       Dental   Pulmonary former smoker,    Pulmonary exam normal        Cardiovascular negative cardio ROS Normal cardiovascular exam     Neuro/Psych negative neurological ROS     GI/Hepatic negative GI ROS, Neg liver ROS,   Endo/Other  negative endocrine ROS  Renal/GU negative Renal ROS     Musculoskeletal   Abdominal   Peds  Hematology  (+) anemia ,   Anesthesia Other Findings   Reproductive/Obstetrics (+) Pregnancy                             Lab Results  Component Value Date   WBC 8.5 03/19/2016   HGB 8.8 (L) 03/19/2016   HCT 27.7 (L) 03/19/2016   MCV 60.7 (L) 03/19/2016   PLT 231 03/19/2016    Anesthesia Physical Anesthesia Plan  ASA: II  Anesthesia Plan: Epidural   Post-op Pain Management:    Induction:   Airway Management Planned: Natural Airway  Additional Equipment:   Intra-op Plan:   Post-operative Plan:   Informed Consent: I have reviewed the patients History and Physical, chart, labs and discussed the procedure including the risks, benefits and alternatives for the proposed anesthesia with the patient or authorized representative who has indicated his/her understanding and acceptance.     Plan Discussed with:   Anesthesia Plan Comments:         Anesthesia Quick Evaluation

## 2016-03-19 NOTE — H&P (Signed)
Katelyn BoschLeona Bauer is a 25 y.o. female G1P1001 @ 39.0wks presenting for IOL due to polyhydramnios with borderline GDMA1 and prev hx GDM. She denies leaking, bldg, H/A, N/V. Her preg has been followed by the [place] office and has been remarkable for 1) poly 2) hx GDM and borderine A1GDM this preg 3) GBS neg. EFW on scan 2 weeks ago- 64%; AFI on scan 12/20= 33cm.  OB History    Gravida Para Term Preterm AB Living   2 1 1  0 0 1   SAB TAB Ectopic Multiple Live Births   0 0 0 0 1      Obstetric Comments   [place]  History of Twins runs in the family. Patient is a twin And her father is a twin.     Past Medical History:  Diagnosis Date  . Medical history non-contributory    Past Surgical History:  Procedure Laterality Date  . NO PAST SURGERIES     Family History: family history includes Lung cancer in her paternal grandmother. Social History:  reports that she has quit smoking. She has never used smokeless tobacco. She reports that she drinks alcohol. She reports that she does not use drugs.     Maternal Diabetes: Yes:  Diabetes Type:  Diet controlled- borderline A1GDM Genetic Screening: Normal Maternal Ultrasounds/Referrals: Abnormal:  Findings:   Other: polyhydramnios Fetal Ultrasounds or other Referrals:  None Maternal Substance Abuse:  No Significant Maternal Medications:  None Significant Maternal Lab Results:  Lab values include: Group B Strep negative Other Comments:  None  ROS History Dilation: 3 Effacement (%): 50 Station: -3 Exam by:: k fields, rn Blood pressure 108/67, pulse 77, temperature 97.9 F (36.6 C), temperature source Oral, resp. rate 16, height 5\' 5"  (1.651 m), weight 80.7 kg (178 lb), last menstrual period 06/20/2015. Exam Physical Exam  Constitutional: She is oriented to person, place, and time. She appears well-developed.  HENT:  Head: Normocephalic.  Neck: Normal range of motion.  Cardiovascular: Normal rate.   Respiratory: Effort normal.   GI:  EFM 130s, +accels, no decels, Cat 1 Ctx q 1-2 mins after cytotec  Musculoskeletal: Normal range of motion.  Neurological: She is alert and oriented to person, place, and time.  Skin: Skin is warm and dry.  Psychiatric: She has a normal mood and affect. Her behavior is normal. Thought content normal.    Prenatal labs: ABO, Rh: --/--/B POS (12/26 0800) Antibody: NEG (12/26 0800) Rubella: 7.51 (05/03 1327) RPR: Non Reactive (10/17 1130)  HBsAg: Negative (05/03 1327)  HIV: Non Reactive (10/17 1130)  GBS: Negative (12/04 0000)   Assessment/Plan: IUP@39 .0wks Polyhydramnios Borderline A1GDM Cx unfavorable  Admit to Katelyn Bauer Will ripen cx with cytotec to start Anticipate SVD   Katelyn Bauer, Katelyn Bauer CNM 03/19/2016, 11:24 AM

## 2016-03-20 ENCOUNTER — Ambulatory Visit (HOSPITAL_COMMUNITY): Payer: Medicaid Other

## 2016-03-20 ENCOUNTER — Encounter (HOSPITAL_COMMUNITY): Payer: Self-pay

## 2016-03-20 DIAGNOSIS — O24420 Gestational diabetes mellitus in childbirth, diet controlled: Secondary | ICD-10-CM

## 2016-03-20 DIAGNOSIS — O403XX0 Polyhydramnios, third trimester, not applicable or unspecified: Secondary | ICD-10-CM

## 2016-03-20 DIAGNOSIS — Z3A39 39 weeks gestation of pregnancy: Secondary | ICD-10-CM

## 2016-03-20 LAB — RPR: RPR: NONREACTIVE

## 2016-03-20 MED ORDER — DIBUCAINE 1 % RE OINT: 1.0000 "application " | TOPICAL_OINTMENT | RECTAL | Status: DC | PRN

## 2016-03-20 MED ORDER — COCONUT OIL OIL: 1.0000 "application " | TOPICAL_OIL | Status: DC | PRN

## 2016-03-20 MED ORDER — ACETAMINOPHEN 325 MG PO TABS
650.0000 mg | ORAL_TABLET | ORAL | Status: DC | PRN
Start: 2016-03-20 — End: 2016-03-21
  Administered 2016-03-21: 650 mg via ORAL
  Filled 2016-03-20: qty 2

## 2016-03-20 MED ORDER — ONDANSETRON HCL 4 MG/2ML IJ SOLN: 4.0000 mg | INTRAMUSCULAR | Status: DC | PRN

## 2016-03-20 MED ORDER — WITCH HAZEL-GLYCERIN EX PADS
1.0000 "application " | MEDICATED_PAD | CUTANEOUS | Status: DC | PRN
  Administered 2016-03-21: 1 via TOPICAL

## 2016-03-20 MED ORDER — BENZOCAINE-MENTHOL 20-0.5 % EX AERO
1.0000 "application " | INHALATION_SPRAY | CUTANEOUS | Status: DC | PRN
  Filled 2016-03-20: qty 56

## 2016-03-20 MED ORDER — ZOLPIDEM TARTRATE 5 MG PO TABS: 5.0000 mg | ORAL_TABLET | Freq: Every evening | ORAL | Status: DC | PRN

## 2016-03-20 MED ORDER — SIMETHICONE 80 MG PO CHEW: 80.0000 mg | CHEWABLE_TABLET | ORAL | Status: DC | PRN

## 2016-03-20 MED ORDER — LACTATED RINGERS IV SOLN: INTRAVENOUS | Status: DC

## 2016-03-20 MED ORDER — SENNOSIDES-DOCUSATE SODIUM 8.6-50 MG PO TABS
2.0000 | ORAL_TABLET | ORAL | Status: DC
  Administered 2016-03-20: 2 via ORAL
  Filled 2016-03-20: qty 2

## 2016-03-20 MED ORDER — TETANUS-DIPHTH-ACELL PERTUSSIS 5-2.5-18.5 LF-MCG/0.5 IM SUSP: 0.5000 mL | Freq: Once | INTRAMUSCULAR | Status: DC

## 2016-03-20 MED ORDER — DIPHENHYDRAMINE HCL 25 MG PO CAPS: 25.0000 mg | ORAL_CAPSULE | Freq: Four times a day (QID) | ORAL | Status: DC | PRN

## 2016-03-20 MED ORDER — ONDANSETRON HCL 4 MG PO TABS: 4.0000 mg | ORAL_TABLET | ORAL | Status: DC | PRN

## 2016-03-20 MED ORDER — IBUPROFEN 600 MG PO TABS
600.0000 mg | ORAL_TABLET | Freq: Four times a day (QID) | ORAL | Status: DC
  Administered 2016-03-20 – 2016-03-21 (×4): 600 mg via ORAL
  Filled 2016-03-20 (×4): qty 1

## 2016-03-20 MED ORDER — PRENATAL MULTIVITAMIN CH
1.0000 | ORAL_TABLET | Freq: Every day | ORAL | Status: DC
  Administered 2016-03-20 – 2016-03-21 (×2): 1 via ORAL
  Filled 2016-03-20 (×2): qty 1

## 2016-03-20 NOTE — Anesthesia Postprocedure Evaluation (Signed)
Anesthesia Post Note  Patient: [name]  Procedure(s) Performed: * No procedures listed *  Patient location during evaluation: Mother Baby Anesthesia Type: Epidural Level of consciousness: awake and alert Pain management: pain level controlled Vital Signs Assessment: post-procedure vital signs reviewed and stable Respiratory status: spontaneous breathing Cardiovascular status: blood pressure returned to baseline Postop Assessment: no headache, no backache, patient able to bend at knees, no signs of nausea or vomiting, epidural receding and adequate PO intake Anesthetic complications: no        Last Vitals:  Vitals:   03/20/16 1140 03/20/16 1255  BP: 110/61 120/62  Pulse: 72 70  Resp: 18 18  Temp: 36.9 C 36.8 C    Last Pain:  Vitals:   03/20/16 1255  TempSrc: Oral  PainSc: 0-No pain   Pain Goal: Patients Stated Pain Goal: 5 (03/19/16 0746)               Salome ArntSterling, Franki Stemen Marie

## 2016-03-20 NOTE — Lactation Note (Signed)
This note was copied from a baby's chart. Lactation Consultation Note  Patient Name: Katelyn Bauer EAVWU'JToday's Date: 03/20/2016 Reason for consult: Follow-up assessment Mom called out for assist.  She currently has baby in a cradle hold and latch shallow.  Baby taken off the breast and positioned in football hold.   Mom shown how to support and compress breast.  Baby opened wide and latched deeply.  Good active suck/swallows observed.  Demonstrated waking techniques and breast massage.  Encouraged to call with concerns/assist prn.  Maternal Data Has patient been taught Hand Expression?: Yes Does the patient have breastfeeding experience prior to this delivery?: Yes  Feeding Feeding Type: Breast Fed Length of feed: 20 min  LATCH Score/Interventions Latch: Grasps breast easily, tongue down, lips flanged, rhythmical sucking.  Audible Swallowing: Spontaneous and intermittent Intervention(s): Skin to skin;Hand expression  Type of Nipple: Everted at rest and after stimulation  Comfort (Breast/Nipple): Soft / non-tender     Hold (Positioning): Assistance needed to correctly position infant at breast and maintain latch. Intervention(s): Breastfeeding basics reviewed;Support Pillows;Position options;Skin to skin  LATCH Score: 9  Lactation Tools Discussed/Used     Consult Status Consult Status: Follow-up Date: 03/21/16 Follow-up type: In-patient    Huston FoleyMOULDEN, Pilot Prindle S 03/20/2016, 4:06 PM

## 2016-03-20 NOTE — Lactation Note (Signed)
This note was copied from a baby's chart. Lactation Consultation Note  Patient Name: Katelyn Lance BoschLeona Snelling UJWJX'BToday's Date: 03/20/2016 Reason for consult: Initial assessment Breastfeeding consultation services and support information given and reviewed.  This is mom's second baby and she breastfed her first baby without difficulty.  Newborn is 5 hours old and just came off the breast.  Mom not sure if baby is latching deep enough.  Baby is currently sleeping and mom instructed to call for assist when baby starts to show feeding cues.  Mom agreeable. Maternal Data Does the patient have breastfeeding experience prior to this delivery?: Yes  Feeding Feeding Type: Breast Fed  LATCH Score/Interventions                      Lactation Tools Discussed/Used     Consult Status Consult Status: Follow-up Date: 03/20/16    Huston FoleyMOULDEN, Delsin Copen S 03/20/2016, 2:51 PM

## 2016-03-20 NOTE — Progress Notes (Signed)
Patient ID: Katelyn Bauer, female   DOB: 01/16/1991, 25 y.o.   MRN: [id number] Katelyn Bauer is a 25 y.o. G2P1001 at 6057w1d.  Subjective: Mod pressure w/ UC's  Objective: BP 115/73   Pulse 77   Temp 98 F (36.7 C) (Oral)   Resp 20   Ht 5\' 5"  (1.651 m)   Wt 178 lb (80.7 kg)   LMP 06/20/2015 (LMP Unknown)   SpO2 94%   BMI 29.62 kg/m    FHT:  FHR: 140 bpm, variability: mod,  accelerations: 10x10,  decelerations:  Repetitive variables and now late decels.  UC:   Q 2-4 minutes, strong Dilation: 10 Dilation Complete Date: 03/20/16 Dilation Complete Time: 0312 Effacement (%): 100 Cervical Position: Middle Station: +1 Presentation: Vertex Exam by:: Mervin Kung Stewart, RN  Labs: Results for orders placed or performed during the hospital encounter of 03/19/16 (from the past 24 hour(s))  CBC     Status: Abnormal   Collection Time: 03/19/16  8:00 AM  Result Value Ref Range   WBC 8.5 4.0 - 10.5 K/uL   RBC 4.56 3.87 - 5.11 MIL/uL   Hemoglobin 8.8 (L) 12.0 - 15.0 g/dL   HCT 40.927.7 (L) 81.136.0 - 91.446.0 %   MCV 60.7 (L) 78.0 - 100.0 fL   MCH 19.3 (L) 26.0 - 34.0 pg   MCHC 31.8 30.0 - 36.0 g/dL   RDW 78.215.7 (H) 95.611.5 - 21.315.5 %   Platelets 231 150 - 400 K/uL  Type and screen [facility]     Status: None   Collection Time: 03/19/16  8:00 AM  Result Value Ref Range   ABO/RH(D) B POS    Antibody Screen NEG    Sample Expiration 03/22/2016   RPR     Status: None   Collection Time: 03/19/16  8:00 AM  Result Value Ref Range   RPR Ser Ql Non Reactive Non Reactive  ABO/Rh     Status: None   Collection Time: 03/19/16  8:00 AM  Result Value Ref Range   ABO/RH(D) B POS     Assessment / Plan: 1457w1d week IUP Labor: Start second stage Fetal Wellbeing:  Category II Pain Control:  Epidural Anticipated MOD:  SVD D/C pitocin, start IV bolus and CTO FHR closely   Dorathy KinsmanVirginia Deward Bauer, CNM 03/20/2016 3:38 AM

## 2016-03-20 NOTE — Progress Notes (Signed)
Attending notified of patients inability to control her bladder. Pt stated she urinated on herself in the bed and was unable to hold her urine when ambulated with assistance to the bathroom. Will continue to monitor.

## 2016-03-20 NOTE — Progress Notes (Signed)
LABOR PROGRESS NOTE  Katelyn BoschLeona Bauer is a 25 y.o. G2P1001 at 7621w1d  admitted for IOL due to polyhydramnios  Subjective: Patient complete and pushing. Having more success moving baby with position change to left tilt  Objective: BP 115/73   Pulse 77   Temp 98 F (36.7 C) (Oral)   Resp 20   Ht 5\' 5"  (1.651 m)   Wt 178 lb (80.7 kg)   LMP 06/20/2015 (LMP Unknown)   SpO2 94%   BMI 29.62 kg/m  or  Vitals:   03/20/16 0201 03/20/16 0231 03/20/16 0301 03/20/16 0331  BP: 119/62 114/62 112/86 115/73  Pulse: 65 68 68 77  Resp: 18 20 18 20   Temp:    98 F (36.7 C)  TempSrc:    Oral  SpO2:      Weight:      Height:         Dilation: 10 Dilation Complete Date: 03/20/16 Dilation Complete Time: 0312 Effacement (%): 100 Cervical Position: Middle Station: +1 Presentation: Vertex Exam by:: Mervin Kung Stewart, RN  Labs: Lab Results  Component Value Date   WBC 8.5 03/19/2016   HGB 8.8 (L) 03/19/2016   HCT 27.7 (L) 03/19/2016   MCV 60.7 (L) 03/19/2016   PLT 231 03/19/2016    Patient Active Problem List   Diagnosis Date Noted  . Polyhydramnios affecting pregnancy 03/19/2016  . GDM (gestational diabetes mellitus), class A1 02/19/2016  . Polyhydramnios affecting pregnancy in third trimester 02/19/2016  . H/O gestational diabetes in prior pregnancy, currently pregnant 08/24/2015  . Supervision of high risk pregnancy, antepartum 07/26/2015    Assessment / Plan: 25 y.o. G2P1001 at 4421w1d here for IOL due to poly  Labor: dilation complete at 03:12, now pushing  Fetal Wellbeing:  Cat I Pain Control:  epidural Anticipated MOD:  NSVD  Clearance CootsAndrew Jahnay Lantier, MD 03/20/2016, 5:35 AM

## 2016-03-20 NOTE — Progress Notes (Signed)
Patient ID: [name], female   DOB: 11/30/1990, 25 y.o.   MRN: [id number] Very inadequate contraction pattern w/ pitocin off. Pt laboring down. FHR 135, mod variability, 10x10 accels, no decels, Restart pitocin. Plan amnioinfusion if variables return. Resume pushing.   [place] Saber Dickerman, CNM 03/20/2016 7:28 AM

## 2016-03-20 NOTE — Progress Notes (Signed)
Patient ID: Katelyn Bauer, female   DOB: 04/30/1990, 25 y.o.   MRN: [id number]   LABOR PROGRESS NOTE  Katelyn Bauer is a 25 y.o. G2P1001 at 4956w1d  admitted for IOL due to polyhydramnios  Subjective: Patient comfortable. No complaints  Objective: BP 113/74   Pulse (!) 101   Temp 98 F (36.7 C) (Oral)   Resp 20   Ht 5\' 5"  (1.651 m)   Wt 178 lb (80.7 kg)   LMP 06/20/2015 (LMP Unknown)   SpO2 94%   BMI 29.62 kg/m  or        Vitals:   03/19/16 2231 03/19/16 2301 03/19/16 2331 03/20/16 0001  BP: 118/74 119/60 121/68 113/74  Pulse: 75 76 79 (!) 101  Resp: 16 18 18 20   Temp:  98 F (36.7 C)    TempSrc:  Oral    SpO2:      Weight:      Height:         Dilation: 9 Effacement (%): 90 Cervical Position: Middle Station: -1, 0 Presentation: Vertex Exam by:: Mervin Kung Stewart, RN  Labs: Recent Labs       Lab Results  Component Value Date   WBC 8.5 03/19/2016   HGB 8.8 (L) 03/19/2016   HCT 27.7 (L) 03/19/2016   MCV 60.7 (L) 03/19/2016   PLT 231 03/19/2016          Patient Active Problem List   Diagnosis Date Noted  . Polyhydramnios affecting pregnancy 03/19/2016  . GDM (gestational diabetes mellitus), class A1 02/19/2016  . Polyhydramnios affecting pregnancy in third trimester 02/19/2016  . H/O gestational diabetes in prior pregnancy, currently pregnant 08/24/2015  . Supervision of high risk pregnancy, antepartum 07/26/2015    Assessment / Plan: 25 y.o. G2P1001 at 356w1d here for IOL due to polyhydramnios  Labor: progressing well Fetal Wellbeing:  Cat I Pain Control:  epidural Anticipated MOD:  NSVD  Clearance CootsAndrew Delmos Velaquez, MD 03/20/2016, 12:03 AM

## 2016-03-21 MED ORDER — IBUPROFEN 600 MG PO TABS: 600.0000 mg | ORAL_TABLET | Freq: Four times a day (QID) | ORAL | 1 refills | Status: DC

## 2016-03-21 MED ORDER — SENNOSIDES-DOCUSATE SODIUM 8.6-50 MG PO TABS: 2.0000 | ORAL_TABLET | ORAL | 1 refills | Status: DC

## 2016-03-21 MED ORDER — OXYCODONE-ACETAMINOPHEN 5-325 MG PO TABS: 1.0000 | ORAL_TABLET | ORAL | 0 refills | Status: DC | PRN

## 2016-03-21 MED ORDER — MEDROXYPROGESTERONE ACETATE 150 MG/ML IM SUSP: 150.0000 mg | INTRAMUSCULAR | 4 refills | Status: DC

## 2016-03-21 NOTE — Discharge Summary (Signed)
Obstetric Discharge Summary Reason for Admission: induction of labor and polyhydramnios, previous hx of GDM Prenatal Procedures: NST and ultrasound Intrapartum Procedures: spontaneous vaginal delivery Postpartum Procedures: none Complications-Operative and Postpartum: 2nd degree perineal laceration Hemoglobin  Date Value Ref Range Status  03/19/2016 8.8 (L) 12.0 - 15.0 g/dL Final   HCT  Date Value Ref Range Status  03/19/2016 27.7 (L) 36.0 - 46.0 % Final   Hematocrit  Date Value Ref Range Status  01/09/2016 28.4 (L) 34.0 - 46.6 % Final    Physical Exam:  General: alert, cooperative and no distress Lochia: appropriate Uterine Fundus: firm Incision: no significant drainage, no dehiscence, no significant erythema DVT Evaluation: No evidence of DVT seen on physical exam. No cords or calf tenderness. No significant calf/ankle edema.  Discharge Diagnoses: Term Pregnancy-delivered  Discharge Information: Date: 03/21/2016 Activity: pelvic rest Diet: routine Medications: PNV, Ibuprofen, Colace, Iron, Percocet and Depo injections Condition: stable Instructions: refer to practice specific booklet Discharge to: home Follow-up Information    Anjela Cassara A Michaele Amundson, CNM Follow up in 4 week(s).   Specialty:  Certified Nurse Midwife Why:  postpartum exam/depo injection.  Contact information: [address] [place] [address] [phone]           Newborn Data: Live born female  Birth Weight: 7 lb 5.1 oz (3320 g) APGAR: 9, 9  Home with mother.  Roe Coombsachelle A Damone Fancher, CNM 03/21/2016, 7:56 AM

## 2016-03-21 NOTE — Plan of Care (Signed)
Problem: Education: Goal: Knowledge of [facility] Education information/materials will improve Discharge education reviewed with patient. Patient verbalizes understanding of information.

## 2016-03-21 NOTE — Progress Notes (Signed)
Post Partum Day #1 Subjective: no complaints, up ad lib, voiding and tolerating PO  Objective: Blood pressure 108/64, pulse (!) 59, temperature 98.1 F (36.7 C), temperature source Oral, resp. rate 16, height 5\' 5"  (1.651 m), weight 178 lb (80.7 kg), last menstrual period 06/20/2015, SpO2 98 %, unknown if currently breastfeeding.  Physical Exam:  General: alert, cooperative and no distress Lochia: appropriate Uterine Fundus: firm Incision: no significant drainage, no dehiscence, no significant erythema DVT Evaluation: No evidence of DVT seen on physical exam. No cords or calf tenderness. No significant calf/ankle edema.   Recent Labs  03/19/16 0800  HGB 8.8*  HCT 27.7*    Assessment/Plan: Discharge home, Breastfeeding and Contraception Depo injections   LOS: 2 days   Roe CoombsRachelle A Meah Jiron, CNM 03/21/2016, 7:53 AM

## 2016-03-21 NOTE — Progress Notes (Signed)
RN encouraged timed toileting. Patient denies any incontinence with voids this shift. Will continue to monitor. Katelyn Bauer, Katelyn Downum E, RN

## 2016-03-21 NOTE — Lactation Note (Signed)
This note was copied from a baby's chart. Lactation Consultation Note: Mother is independently latching infant without any problems. Observed infant feeding with burst of suckling and swallows. Mother was given a harmony hand pump with instructions to use if needed when milk come in to soften areola tissue. Mother informed of available support groups and other Lactation services.   Patient Name: Katelyn Lance BoschLeona Sundberg ONGEX'BToday's Date: 03/21/2016     Maternal Data    Feeding    LATCH Score/Interventions                      Lactation Tools Discussed/Used     Consult Status      Michel BickersKendrick, Elvin Banker McCoy 03/21/2016, 12:36 PM

## 2016-04-12 ENCOUNTER — Encounter: Payer: Self-pay | Admitting: *Deleted

## 2016-04-12 ENCOUNTER — Telehealth: Payer: Self-pay | Admitting: *Deleted

## 2016-04-12 NOTE — Telephone Encounter (Signed)
Pt called to office stating she had questions about breastfeeding. Pt states that she is having some pain/knot in breast.  Pt states it is hard for her to feed/pump. Pt states that she has been trying to pump, massage, warm compress to area. Pt can't seem to get knot to release. Pt advised she could be seen in office today, pt doesn't think she can make it today. Pt was given appt for Monday. Pt advised to continue at home measures. Pt advised if she were to have fever, redness in breast, pain worsens that [facility]  Pt states understanding.

## 2016-04-15 ENCOUNTER — Ambulatory Visit (INDEPENDENT_AMBULATORY_CARE_PROVIDER_SITE_OTHER): Payer: Medicaid Other | Admitting: Obstetrics & Gynecology

## 2016-04-15 ENCOUNTER — Encounter: Payer: Self-pay | Admitting: Obstetrics & Gynecology

## 2016-04-15 DIAGNOSIS — Z3202 Encounter for pregnancy test, result negative: Secondary | ICD-10-CM | POA: Diagnosis not present

## 2016-04-15 DIAGNOSIS — Z30013 Encounter for initial prescription of injectable contraceptive: Secondary | ICD-10-CM

## 2016-04-15 LAB — POCT URINE PREGNANCY: Preg Test, Ur: NEGATIVE

## 2016-04-15 MED ORDER — MEDROXYPROGESTERONE ACETATE 150 MG/ML IM SUSP
150.0000 mg | Freq: Once | INTRAMUSCULAR | Status: AC
  Administered 2016-04-15: 150 mg via INTRAMUSCULAR

## 2016-04-15 MED ORDER — SULFAMETHOXAZOLE-TRIMETHOPRIM 800-160 MG PO TABS: 1.0000 | ORAL_TABLET | Freq: Two times a day (BID) | ORAL | 0 refills | Status: DC

## 2016-04-15 NOTE — Progress Notes (Signed)
Subjective:     [name] is a 26 y.o. female who presents for a postpartum visit. She is 5 weeks postpartum following a spontaneous vaginal delivery. I have fully reviewed the prenatal and intrapartum course. The delivery was at 39 gestational weeks. Outcome: spontaneous vaginal delivery. Anesthesia: epidural. Postpartum course has been normal. Baby's course has been normal. Baby is feeding by breast. Bleeding no bleeding. Bowel function is normal. Bladder function is normal. Patient is not sexually active. Contraception method is abstinence. Postpartum depression screening: negative.She has had a very sore engorged left breast for about 2 weeks. She is pumping from this breast and breast-feeding from the right breast.  The following portions of the patient's history were reviewed and updated as appropriate: allergies, current medications, past family history, past medical history, past social history, past surgical history and problem list.  Review of Systems Pertinent items are noted in HPI.   Objective:    BP 105/63   Pulse 70   Temp 98.1 F (36.7 C) (Oral)   Wt 155 lb 12.8 oz (70.7 kg)   Breastfeeding? Yes   BMI 25.93 kg/m   General:  alert   Breasts:   Right breast  Lungs: clear to auscultation bilaterally  Heart:  regular rate and rhythm, S1, S2 normal, no murmur, click, rub or gallop  Abdomen: soft, non-tender; bowel sounds normal; no masses,  no organomegaly   Vulva:  normal  Vagina: normal vagina  Cervix:  anteverted  Corpus: not examined  Adnexa:  not evaluated  Rectal Exam: Not performed.        Assessment:     Normal  postpartum exam. Pap smear not done at today's visit.   Plan:    1. Contraception: Depo-Provera injections 2. Mastitis- treat with bactrim DS as she is PCN-allergic 3. Follow up in: 3 months for next depo provera or as needed.

## 2016-04-22 ENCOUNTER — Ambulatory Visit: Payer: Medicaid Other | Admitting: Obstetrics and Gynecology

## 2016-04-29 ENCOUNTER — Encounter: Payer: Self-pay | Admitting: *Deleted

## 2016-05-06 ENCOUNTER — Telehealth: Payer: Self-pay

## 2016-05-06 NOTE — Telephone Encounter (Signed)
SVB 03/20/16 c/o bleeding since Depo injection 04/15/16. Sometimes it's heavy, sometimes spotting. Denies changing pads every or less and cramping. Pt informed this is normal. Her body needs time to adjust to the new birth control hormone. Pt to inform [place] if she still experience VB around time for 2nd injection and/or sooner if sx's worsens.

## 2016-07-15 ENCOUNTER — Ambulatory Visit: Payer: Medicaid Other

## 2016-07-15 ENCOUNTER — Telehealth: Payer: Self-pay | Admitting: *Deleted

## 2016-07-15 NOTE — Telephone Encounter (Signed)
Pt needs a script for Depo sent to her pharmacy she received the first shot in the hospital after birth and has never had one done here had an appt today but came without Depo shot.Marland KitchenMarland KitchenPlease Advise.Marland KitchenMarland Kitchen

## 2016-07-15 NOTE — Telephone Encounter (Signed)
Pt was given depo in office in January.  Please verify dates to make sure pt will be on time for appt. Pt should have refills at pharmacy, she was given refills for 1 year.  Please call pt to make her aware.

## 2016-07-22 ENCOUNTER — Ambulatory Visit (INDEPENDENT_AMBULATORY_CARE_PROVIDER_SITE_OTHER): Payer: Medicaid Other

## 2016-07-22 DIAGNOSIS — Z3042 Encounter for surveillance of injectable contraceptive: Secondary | ICD-10-CM

## 2016-07-22 DIAGNOSIS — Z308 Encounter for other contraceptive management: Secondary | ICD-10-CM | POA: Diagnosis not present

## 2016-07-22 LAB — POCT URINE PREGNANCY: Preg Test, Ur: NEGATIVE

## 2016-07-22 NOTE — Progress Notes (Signed)
Patient is in the office for depo re-start, UPT negative, advised patient to schedule next UPT and injection will be given if results negative, patient agreed.

## 2016-08-06 ENCOUNTER — Ambulatory Visit (INDEPENDENT_AMBULATORY_CARE_PROVIDER_SITE_OTHER): Payer: Medicaid Other | Admitting: *Deleted

## 2016-08-06 VITALS — BP 118/68 | HR 96 | Wt 177.8 lb

## 2016-08-06 DIAGNOSIS — Z308 Encounter for other contraceptive management: Secondary | ICD-10-CM

## 2016-08-06 DIAGNOSIS — Z30013 Encounter for initial prescription of injectable contraceptive: Secondary | ICD-10-CM

## 2016-08-06 LAB — POCT URINE PREGNANCY: Preg Test, Ur: NEGATIVE

## 2016-08-06 MED ORDER — MEDROXYPROGESTERONE ACETATE 150 MG/ML IM SUSP
150.0000 mg | Freq: Once | INTRAMUSCULAR | Status: AC
  Administered 2016-08-06: 150 mg via INTRAMUSCULAR

## 2016-08-06 NOTE — Progress Notes (Signed)
Patient is in the office for her second UPT and her Depo injection. She is abstaining at this time.

## 2016-10-24 ENCOUNTER — Ambulatory Visit (INDEPENDENT_AMBULATORY_CARE_PROVIDER_SITE_OTHER): Payer: Medicaid Other

## 2016-10-24 VITALS — BP 118/69 | HR 79 | Wt 178.0 lb

## 2016-10-24 DIAGNOSIS — Z308 Encounter for other contraceptive management: Secondary | ICD-10-CM

## 2016-10-24 DIAGNOSIS — Z3042 Encounter for surveillance of injectable contraceptive: Secondary | ICD-10-CM

## 2016-10-24 MED ORDER — MEDROXYPROGESTERONE ACETATE 150 MG/ML IM SUSP
150.0000 mg | Freq: Once | INTRAMUSCULAR | Status: AC
  Administered 2016-10-24: 150 mg via INTRAMUSCULAR

## 2016-10-24 NOTE — Progress Notes (Signed)
Patient presents for DEPO. Given in RUOQ. Tolerated well. Next DEPO 10/18-11/03/2016  Administrations This Visit    medroxyPROGESTERone (DEPO-PROVERA) injection 150 mg    Admin Date 10/24/2016 Action Given Dose 150 mg Route Intramuscular Administered By Maretta BeesMcGlashan, Saadiq Poche J, RMA

## 2017-01-20 ENCOUNTER — Ambulatory Visit (INDEPENDENT_AMBULATORY_CARE_PROVIDER_SITE_OTHER): Payer: Medicaid Other

## 2017-01-20 VITALS — BP 96/51 | HR 81 | Wt 183.0 lb

## 2017-01-20 DIAGNOSIS — Z3042 Encounter for surveillance of injectable contraceptive: Secondary | ICD-10-CM

## 2017-01-20 DIAGNOSIS — Z309 Encounter for contraceptive management, unspecified: Secondary | ICD-10-CM

## 2017-01-20 MED ORDER — MEDROXYPROGESTERONE ACETATE 150 MG/ML IM SUSP
150.0000 mg | Freq: Once | INTRAMUSCULAR | Status: AC
  Administered 2017-01-20: 150 mg via INTRAMUSCULAR

## 2017-01-20 NOTE — Progress Notes (Signed)
Agree with nursing staff's documentation of this patient's clinic encounter.  Lavella Myren, MD    

## 2017-01-20 NOTE — Progress Notes (Signed)
Pt is in office today for depo injection. Pt is on time for injection.  Pt made aware she will need AEX with next injection due to current ins coverage. Pt advised to return to office 1/14-28 for next appt.  Pt has no other concerns today.  BP (!) 96/51   Pulse 81   Wt 183 lb (83 kg)   BMI 30.45 kg/m    Administrations This Visit    medroxyPROGESTERone (DEPO-PROVERA) injection 150 mg    Admin Date 01/20/2017 Action Given Dose 150 mg Route Intramuscular Administered By Lanney GinsFoster, Suzanne D, CMA

## 2017-04-11 ENCOUNTER — Telehealth: Payer: Self-pay | Admitting: *Deleted

## 2017-04-11 NOTE — Telephone Encounter (Signed)
Left vmail for patient about the change in appt time.Marland Kitchen.Marland Kitchen..Marland Kitchen

## 2017-04-14 ENCOUNTER — Other Ambulatory Visit: Payer: Self-pay

## 2017-04-14 ENCOUNTER — Ambulatory Visit (INDEPENDENT_AMBULATORY_CARE_PROVIDER_SITE_OTHER): Payer: Medicaid Other | Admitting: Certified Nurse Midwife

## 2017-04-14 ENCOUNTER — Ambulatory Visit: Payer: Medicaid Other

## 2017-04-14 ENCOUNTER — Other Ambulatory Visit: Payer: Self-pay | Admitting: Certified Nurse Midwife

## 2017-04-14 ENCOUNTER — Other Ambulatory Visit (HOSPITAL_COMMUNITY)
Admission: RE | Admit: 2017-04-14 | Discharge: 2017-04-14 | Disposition: A | Discharge status: Home / Self Care | Payer: Medicaid Other | Source: Ambulatory Visit | Attending: Obstetrics & Gynecology | Admitting: Obstetrics & Gynecology

## 2017-04-14 ENCOUNTER — Encounter: Payer: Self-pay | Admitting: Certified Nurse Midwife

## 2017-04-14 VITALS — BP 115/70 | HR 85 | Ht 65.0 in | Wt 188.6 lb

## 2017-04-14 DIAGNOSIS — Z01419 Encounter for gynecological examination (general) (routine) without abnormal findings: Secondary | ICD-10-CM | POA: Insufficient documentation

## 2017-04-14 DIAGNOSIS — Z309 Encounter for contraceptive management, unspecified: Secondary | ICD-10-CM

## 2017-04-14 DIAGNOSIS — N912 Amenorrhea, unspecified: Secondary | ICD-10-CM

## 2017-04-14 DIAGNOSIS — Z3042 Encounter for surveillance of injectable contraceptive: Secondary | ICD-10-CM

## 2017-04-14 MED ORDER — MEDROXYPROGESTERONE ACETATE 150 MG/ML IM SUSP
150.0000 mg | Freq: Once | INTRAMUSCULAR | Status: AC
  Administered 2017-04-14: 150 mg via INTRAMUSCULAR

## 2017-04-14 MED ORDER — MEDROXYPROGESTERONE ACETATE 150 MG/ML IM SUSP: 150.0000 mg | Freq: Once | INTRAMUSCULAR | 0 refills | Status: DC

## 2017-04-14 MED ORDER — MEDROXYPROGESTERONE ACETATE 150 MG/ML IM SUSP
150.0000 mg | INTRAMUSCULAR | 4 refills | Status: DC
Start: 2017-04-14 — End: 2018-05-07

## 2017-04-14 NOTE — Telephone Encounter (Addendum)
TC from pt states pharmacy told her Depo rx is expired. Pt has Family Planning. PP visit 04/15/16. Pt has an AEX appt today and she is on schedule for next Depo. [facility]. Pt aware we will give 1 yr worth rf's during AEX appt today. Pt agrees and has no further questions.

## 2017-04-14 NOTE — Progress Notes (Signed)
Subjective:        Katelyn BoschLeona Bauer is a 27 y.o. female here for a routine exam.  Current complaints: Here for routine GYN exam, reports amenorrhea with Depo injections.  Has gained weight since starting on the shots.  Reports change to healthier diet recently.  OTC generic multivitamin encouraged.  Exercise encouraged.  Did not have 2 hour OGTT postpartum, encouraged to have that done as well.  Is not breastfeeding anymore.    Is working and going to school.  Not currently sexually active.   Personal health questionnaire:  Is patient Ashkenazi Jewish, have a family history of breast and/or ovarian cancer: no Is there a family history of uterine cancer diagnosed at age < 5150, gastrointestinal cancer, urinary tract cancer, family member who is a Personnel officerLynch syndrome-associated carrier: no Is the patient overweight and hypertensive, family history of diabetes, personal history of gestational diabetes, preeclampsia or PCOS: yes Is patient over 2955, have PCOS,  family history of premature CHD under age 26, diabetes, smoke, have hypertension or peripheral artery disease:  no At any time, has a partner hit, kicked or otherwise hurt or frightened you?: no Over the past 2 weeks, have you felt down, depressed or hopeless?: no Over the past 2 weeks, have you felt little interest or pleasure in doing things?:no   Gynecologic History Patient's last menstrual period was 01/01/2017 (exact date). Contraception: Depo-Provera injections Last Pap: 08/24/15. Results were: normal Last mammogram: n/a <40 years and no high risk history.   Obstetric History OB History  Gravida Para Term Preterm AB Living  2 2 2  0 0 2  SAB TAB Ectopic Multiple Live Births  0 0 0 0 2    # Outcome Date GA Lbr Len/2nd Weight Sex Delivery Anes PTL Lv  2 Term 03/20/16 [id number] 12:02 / 06:23 7 lb 5.1 oz (3.32 kg) F Vag-Spont EPI  LIV  1 Term 10/17/13   6 lb 11.2 oz (3.039 kg) F Vag-Spont EPI N LIV    Obstetric Comments  [place]  History of Twins runs in the family. Patient is a twin And her father is a twin.    Past Medical History:  Diagnosis Date  . Medical history non-contributory     Past Surgical History:  Procedure Laterality Date  . NO PAST SURGERIES       Current Outpatient Medications:  .  Iron Polysacch Cmplx-B12-FA 150-0.025-1 MG CAPS, Take 1 capsule by mouth daily., Disp: , Rfl:  .  medroxyPROGESTERone (DEPO-PROVERA) 150 MG/ML injection, Inject 1 mL (150 mg total) into the muscle every 3 (three) months., Disp: 1 mL, Rfl: 4 .  Prenatal Vit-Fe Phos-FA-Omega (VITAFOL GUMMIES) 3.33-0.333-34.8 MG CHEW, Chew by mouth., Disp: , Rfl:  Allergies  Allergen Reactions  . Penicillins Swelling and Other (See Comments)    Reaction:  All over body swelling  Has patient had a PCN reaction causing immediate rash, facial/tongue/throat swelling, SOB or lightheadedness with hypotension: Yes Has patient had a PCN reaction causing severe rash involving mucus membranes or skin necrosis: No Has patient had a PCN reaction that required hospitalization No Has patient had a PCN reaction occurring within the last 10 years: No If all of the above answers are "NO", then may proceed with Cephalosporin use.      Social History   Tobacco Use  . Smoking status: Former Games developermoker  . Smokeless tobacco: Never Used  Substance Use Topics  . Alcohol use: No    Alcohol/week: 0.0 oz  Comment: Occasional    Family History  Problem Relation Age of Onset  . Lung cancer Paternal Grandmother       Review of Systems  Constitutional: negative for fatigue and weight loss Respiratory: negative for cough and wheezing Cardiovascular: negative for chest pain, fatigue and palpitations Gastrointestinal: negative for abdominal pain and change in bowel habits Musculoskeletal:negative for myalgias Neurological: negative for gait problems and tremors Behavioral/Psych: negative for abusive relationship, depression Endocrine:  negative for temperature intolerance    Genitourinary:negative for abnormal menstrual periods, genital lesions, hot flashes, sexual problems and vaginal discharge Integument/breast: negative for breast lump, breast tenderness, nipple discharge and skin lesion(s)    Objective:       BP 115/70   Pulse 85   Ht 5\' 5"  (1.651 m)   Wt 188 lb 9.6 oz (85.5 kg)   LMP 01/01/2017 (Exact Date)   Breastfeeding? No   BMI 31.38 kg/m  General:   alert  Skin:   no rash or abnormalities  Lungs:   clear to auscultation bilaterally  Heart:   regular rate and rhythm, S1, S2 normal, no murmur, click, rub or gallop  Breasts:   normal without suspicious masses, skin or nipple changes or axillary nodes  Abdomen:  normal findings: no organomegaly, soft, non-tender and no hernia  Pelvis:  External genitalia: normal general appearance Urinary system: urethral meatus normal and bladder without fullness, nontender Vaginal: normal without tenderness, induration or masses Cervix: normal appearance Adnexa: normal bimanual exam Uterus: anteverted and non-tender, normal size   Lab Review Urine pregnancy test Labs reviewed yes Radiologic studies reviewed no  50% of 30 min visit spent on counseling and coordination of care.   Assessment & Plan    Healthy female exam.    1. Amenorrhea due to Depo Provera    - medroxyPROGESTERone (DEPO-PROVERA) injection 150 mg - Vitamin D (25 hydroxy)  2. Well woman exam    - Cytology - PAP  3. Morbid obesity (HCC)    - Hemoglobin A1c  4. Encounter for surveillance of injectable contraceptive    - medroxyPROGESTERone (DEPO-PROVERA) 150 MG/ML injection; Inject 1 mL (150 mg total) into the muscle every 3 (three) months.  Dispense: 1 mL; Refill: 4   Education reviewed: calcium supplements, depression evaluation, low fat, low cholesterol diet, safe sex/STD prevention, self breast exams, skin cancer screening and weight bearing exercise. Contraception: Depo-Provera  injections. Follow up in: 10 year.   Meds ordered this encounter  Medications  . medroxyPROGESTERone (DEPO-PROVERA) injection 150 mg  . medroxyPROGESTERone (DEPO-PROVERA) 150 MG/ML injection    Sig: Inject 1 mL (150 mg total) into the muscle every 3 (three) months.    Dispense:  1 mL    Refill:  4   Orders Placed This Encounter  Procedures  . Hemoglobin A1c  . Vitamin D (25 hydroxy)    Possible management options include:2 hour OGTT Follow up as needed.

## 2017-04-14 NOTE — Progress Notes (Signed)
Presents for AEX/PAP/DEPO. Next DEPO 4/8-22/2019  Administrations This Visit    medroxyPROGESTERone (DEPO-PROVERA) injection 150 mg    Admin Date 04/14/2017 Action Given Dose 150 mg Route Intramuscular Administered By Maretta BeesMcGlashan, Brion Hedges J, RMA

## 2017-04-15 LAB — VITAMIN D 25 HYDROXY (VIT D DEFICIENCY, FRACTURES): Vit D, 25-Hydroxy: 13.7 ng/mL — ABNORMAL LOW (ref 30.0–100.0)

## 2017-04-15 LAB — HEMOGLOBIN A1C
Est. average glucose Bld gHb Est-mCnc: 126 mg/dL
Hgb A1c MFr Bld: 6 % — ABNORMAL HIGH (ref 4.8–5.6)

## 2017-04-16 ENCOUNTER — Other Ambulatory Visit: Payer: Self-pay | Admitting: Certified Nurse Midwife

## 2017-04-16 DIAGNOSIS — E559 Vitamin D deficiency, unspecified: Secondary | ICD-10-CM

## 2017-04-16 DIAGNOSIS — R7309 Other abnormal glucose: Secondary | ICD-10-CM

## 2017-04-16 MED ORDER — VITAMIN D (ERGOCALCIFEROL) 1.25 MG (50000 UNIT) PO CAPS: 50000.0000 [IU] | ORAL_CAPSULE | ORAL | 2 refills | Status: DC

## 2017-04-17 ENCOUNTER — Other Ambulatory Visit: Payer: Self-pay | Admitting: Certified Nurse Midwife

## 2017-04-17 LAB — CYTOLOGY - PAP: DIAGNOSIS: NEGATIVE

## 2017-04-29 ENCOUNTER — Other Ambulatory Visit: Payer: Self-pay

## 2017-04-29 DIAGNOSIS — R7309 Other abnormal glucose: Secondary | ICD-10-CM

## 2017-06-10 ENCOUNTER — Telehealth: Payer: Self-pay

## 2017-06-10 ENCOUNTER — Other Ambulatory Visit: Payer: Self-pay | Admitting: Certified Nurse Midwife

## 2017-06-10 NOTE — Telephone Encounter (Signed)
TC to pt regarding message of abnormal vaginal bleeding x 26 days now. Pt states w/ use of Depo she did not have cycles . Pt states bleeding is off/ on light no clots just continual. Pt advised breakthrough bleeding can occur w/ contraception However I would consult w/ provider. Please advise.

## 2017-06-11 NOTE — Telephone Encounter (Signed)
TC to pt to discuss options mention Pt not ava and mailbox is full.

## 2017-06-11 NOTE — Telephone Encounter (Signed)
We can try a pack of pills or progesterone pills several times a day to get the bleeding to stop.  Katelyn Bauer

## 2017-07-08 ENCOUNTER — Ambulatory Visit: Payer: Medicaid Other

## 2017-07-09 ENCOUNTER — Ambulatory Visit (INDEPENDENT_AMBULATORY_CARE_PROVIDER_SITE_OTHER): Payer: Medicaid Other

## 2017-07-09 DIAGNOSIS — Z309 Encounter for contraceptive management, unspecified: Secondary | ICD-10-CM | POA: Diagnosis not present

## 2017-07-09 DIAGNOSIS — Z3042 Encounter for surveillance of injectable contraceptive: Secondary | ICD-10-CM

## 2017-07-09 MED ORDER — MEDROXYPROGESTERONE ACETATE 150 MG/ML IM SUSP
150.0000 mg | Freq: Once | INTRAMUSCULAR | Status: AC
  Administered 2017-07-09: 150 mg via INTRAMUSCULAR

## 2017-07-09 NOTE — Progress Notes (Signed)
Agree with A & P. 

## 2017-07-09 NOTE — Progress Notes (Signed)
Pt here for depo shot. Pt is within her window. Inj given in upper outer quad. Pt tolerated well. Next depo due 7/3-7/17.

## 2017-09-01 ENCOUNTER — Ambulatory Visit (HOSPITAL_COMMUNITY)
Admission: EM | Admit: 2017-09-01 | Discharge: 2017-09-01 | Disposition: A | Discharge status: Home / Self Care | Payer: Medicaid Other | Attending: Family Medicine | Admitting: Family Medicine

## 2017-09-01 ENCOUNTER — Encounter (HOSPITAL_COMMUNITY): Payer: Self-pay | Admitting: Emergency Medicine

## 2017-09-01 DIAGNOSIS — R109 Unspecified abdominal pain: Secondary | ICD-10-CM

## 2017-09-01 DIAGNOSIS — Z3202 Encounter for pregnancy test, result negative: Secondary | ICD-10-CM

## 2017-09-01 LAB — POCT URINALYSIS DIP (DEVICE)
BILIRUBIN URINE: NEGATIVE
Glucose, UA: NEGATIVE mg/dL
Ketones, ur: NEGATIVE mg/dL
NITRITE: NEGATIVE
PH: 7 (ref 5.0–8.0)
PROTEIN: NEGATIVE mg/dL
Specific Gravity, Urine: 1.025 (ref 1.005–1.030)
UROBILINOGEN UA: 1 mg/dL (ref 0.0–1.0)

## 2017-09-01 LAB — POCT PREGNANCY, URINE: PREG TEST UR: NEGATIVE

## 2017-09-01 MED ORDER — ACETAMINOPHEN 325 MG PO TABS: 650.0000 mg | ORAL_TABLET | Freq: Four times a day (QID) | ORAL | 0 refills | Status: DC | PRN

## 2017-09-01 NOTE — ED Provider Notes (Signed)
[facility]    CSN: [id number] Arrival date & time: 09/01/17  1050     History   Chief Complaint Chief Complaint  Patient presents with  . Flank Pain    HPI Katelyn Bauer is a 27 y.o. female no significant past medical history presenting today for evaluation of right flank pain.  Patient states that beginning Saturday, approximately 2 to 3 days ago she developed pain in her right flank.  She does not have this pain all the time, will come and go.  When it does come and is very intense and sharp, radiates into her right abdomen as well as right back.  States that she can go for hours without having it or will happen as frequently as every few minutes.  Pain will typically last for about 1 minute, she equates this similar sensation as if she was having a contraction.  Patient denies any urinary symptoms of dysuria, increased frequency, urgency or hematuria.  Denies history of previous kidney stones.  Denies vaginal discharge or abnormal bleeding.  Patient uses Depakote for birth control.  States her last menstrual period was 2/1 and lasted until 3/27.  This is typical for her and has very irregular cycles on Depo-Provera last injection was 2 months ago.  Does believe the pain will worsen with certain movements.  Denies any associated nausea, vomiting or diarrhea.  Denies worsening with eating.  Denies any injury recently.  HPI  Past Medical History:  Diagnosis Date  . Medical history non-contributory     Patient Active Problem List   Diagnosis Date Noted  . Vitamin D deficiency 04/16/2017  . Elevated hemoglobin A1c 04/16/2017  . GDM (gestational diabetes mellitus), class A1 02/19/2016    Past Surgical History:  Procedure Laterality Date  . NO PAST SURGERIES      OB History    Gravida  2   Para  2   Term  2   Preterm  0   AB  0   Living  2     SAB  0   TAB  0   Ectopic  0   Multiple  0   Live Births  2        Obstetric Comments  Delivered in  [place].  History of Twins runs in the family. Patient is a twin And her father is a twin.         Home Medications    Prior to Admission medications   Medication Sig Start Date End Date Taking? Authorizing Provider  acetaminophen (TYLENOL) 325 MG tablet Take 2 tablets (650 mg total) by mouth every 6 (six) hours as needed. 09/01/17   Torrence Branagan C, PA-C  Iron Polysacch Cmplx-B12-FA 150-0.025-1 MG CAPS Take 1 capsule by mouth daily.    [provider]  medroxyPROGESTERone (DEPO-PROVERA) 150 MG/ML injection Inject 1 mL (150 mg total) into the muscle every 3 (three) months. 04/14/17   Roe Coombs, CNM  Prenatal Vit-Fe Phos-FA-Omega (VITAFOL GUMMIES) 3.33-0.333-34.8 MG CHEW Chew by mouth.    [provider]  Vitamin D, Ergocalciferol, (DRISDOL) 50000 units CAPS capsule Take 1 capsule (50,000 Units total) by mouth every 7 (seven) days. 04/16/17   Roe Coombs, CNM    Family History Family History  Problem Relation Age of Onset  . Lung cancer Paternal Grandmother     Social History Social History   Tobacco Use  . Smoking status: Former Games developer  . Smokeless tobacco: Never Used  Substance Use Topics  .  Alcohol use: No    Alcohol/week: 0.0 oz    Comment: Occasional  . Drug use: No     Allergies   Penicillins   Review of Systems Review of Systems  Constitutional: Negative for fever.  Respiratory: Negative for cough and shortness of breath.   Cardiovascular: Negative for chest pain.  Gastrointestinal: Positive for abdominal pain. Negative for diarrhea, nausea and vomiting.  Genitourinary: Positive for frequency. Negative for dysuria, flank pain, genital sores, hematuria, menstrual problem, vaginal bleeding, vaginal discharge and vaginal pain.  Musculoskeletal: Positive for back pain and myalgias.  Skin: Negative for rash.  Neurological: Negative for dizziness, light-headedness and headaches.     Physical Exam Triage Vital Signs ED Triage  Vitals [09/01/17 1154]  Enc Vitals Group     BP 110/67     Pulse Rate 82     Resp 18     Temp 98.4 F (36.9 C)     Temp Source Oral     SpO2 98 %     Weight      Height      Head Circumference      Peak Flow      Pain Score      Pain Loc      Pain Edu?      Excl. in GC?    No data found.  Updated Vital Signs BP 110/67 (BP Location: Left Arm)   Pulse 82   Temp 98.4 F (36.9 C) (Oral)   Resp 18   SpO2 98%   Visual Acuity Right Eye Distance:   Left Eye Distance:   Bilateral Distance:    Right Eye Near:   Left Eye Near:    Bilateral Near:     Physical Exam  Constitutional: She is oriented to person, place, and time. She appears well-developed and well-nourished. No distress.  HENT:  Head: Normocephalic and atraumatic.  Mouth/Throat: Oropharynx is clear and moist.  Eyes: Pupils are equal, round, and reactive to light. Conjunctivae and EOM are normal.  Neck: Neck supple.  Cardiovascular: Normal rate and regular rhythm.  No murmur heard. Pulmonary/Chest: Effort normal and breath sounds normal. No respiratory distress.  Abdominal: Soft. There is no tenderness.  Bowel sounds present throughout all 4 quadrants, nontender to palpation throughout all 4 quadrants and epigastrium, negative Murphy's, negative McBurney's.  Negative rebound.  Musculoskeletal: She exhibits no edema.  Mild tenderness to palpation of right lower flank, nontender to palpation of lower thoracic and lumbar spine midline, nontender to palpation of right lateral lumbar and thoracic musculature.  Normal ambulation.  Full range of motion of back.  Neurological: She is alert and oriented to person, place, and time.  Skin: Skin is warm and dry.  Psychiatric: She has a normal mood and affect.  Nursing note and vitals reviewed.    UC Treatments / Results  Labs (all labs ordered are listed, but only abnormal results are displayed) Labs Reviewed  POCT URINALYSIS DIP (DEVICE) - Abnormal; Notable for the  following components:      Result Value   Hgb urine dipstick TRACE (*)    Leukocytes, UA TRACE (*)    All other components within normal limits  POCT PREGNANCY, URINE    EKG None  Radiology No results found.  Procedures Procedures (including critical care time)  Medications Ordered in UC Medications - No data to display  Initial Impression / Assessment and Plan / UC Course  I have reviewed the triage vital signs and the nursing notes.  Pertinent labs & imaging results that were available during my care of the patient were reviewed by me and considered in my medical decision making (see chart for details).     Patient with right flank pain, trace hemoglobin and trace leuks.  Episodic pain correlates well with kidney stone, but would expect more hemoglobin.  Possible muscular etiology.  Patient without other GI symptoms, less likely GI related.  Possible menstrual cramping.  At this time will treat conservatively with Tylenol.  Continue to monitor. Discussed strict return precautions. Patient verbalized understanding and is agreeable with plan.  Final Clinical Impressions(s) / UC Diagnoses   Final diagnoses:  Right flank pain     Discharge Instructions     Please take tylenol or Ibuprofen  Use heating pad  Please return if symptoms worsening, developing change in urine, developing nausea, vomiting, diarrhea, fever   ED Prescriptions    Medication Sig Dispense Auth. Provider   acetaminophen (TYLENOL) 325 MG tablet Take 2 tablets (650 mg total) by mouth every 6 (six) hours as needed. 30 tablet Suliman Termini, RenovoHallie C, PA-C     Controlled Substance Prescriptions [place] Controlled Substance Registry consulted? Not Applicable   Lew DawesWieters, Coron Rossano C, [place]-C 09/01/17 1228

## 2017-09-01 NOTE — Discharge Instructions (Signed)
Please take tylenol or Ibuprofen  Use heating pad  Please return if symptoms worsening, developing change in urine, developing nausea, vomiting, diarrhea, fever

## 2017-09-01 NOTE — ED Triage Notes (Signed)
Pt sts left sided flank pain into back and abd area

## 2017-09-30 ENCOUNTER — Ambulatory Visit: Payer: Medicaid Other

## 2017-10-01 ENCOUNTER — Ambulatory Visit: Payer: Medicaid Other

## 2017-10-01 DIAGNOSIS — Z3042 Encounter for surveillance of injectable contraceptive: Secondary | ICD-10-CM

## 2017-10-01 MED ORDER — MEDROXYPROGESTERONE ACETATE 150 MG/ML IM SUSP
150.0000 mg | Freq: Once | INTRAMUSCULAR | Status: AC
  Administered 2017-10-01: 150 mg via INTRAMUSCULAR

## 2017-10-01 NOTE — Progress Notes (Signed)
Pt presents for Depo Injection  Last Depo: 07/09/17  Next Depo Due :09/25/-10/09  Site:  LUOQ   Given w/ no problems  Pt supplied  Exp:10/22/2021

## 2017-12-22 ENCOUNTER — Ambulatory Visit: Payer: Medicaid Other

## 2017-12-23 ENCOUNTER — Ambulatory Visit (INDEPENDENT_AMBULATORY_CARE_PROVIDER_SITE_OTHER): Payer: Medicaid Other

## 2017-12-23 VITALS — BP 129/71 | HR 87 | Wt 187.8 lb

## 2017-12-23 DIAGNOSIS — Z3042 Encounter for surveillance of injectable contraceptive: Secondary | ICD-10-CM

## 2017-12-23 MED ORDER — MEDROXYPROGESTERONE ACETATE 150 MG/ML IM SUSP
150.0000 mg | Freq: Once | INTRAMUSCULAR | Status: AC
  Administered 2017-12-23: 150 mg via INTRAMUSCULAR

## 2017-12-23 NOTE — Progress Notes (Signed)
Presents for DEPO, given in RUOQ without difficulty. Next DEPO 12/17-31/2019.  Administrations This Visit    medroxyPROGESTERone (DEPO-PROVERA) injection 150 mg    Admin Date 12/23/2017 Action Given Dose 150 mg Route Intramuscular Administered By Maretta Bees, RMA

## 2018-03-11 ENCOUNTER — Ambulatory Visit: Payer: Medicaid Other

## 2018-03-24 ENCOUNTER — Ambulatory Visit (INDEPENDENT_AMBULATORY_CARE_PROVIDER_SITE_OTHER): Payer: Medicaid Other

## 2018-03-24 DIAGNOSIS — Z3042 Encounter for surveillance of injectable contraceptive: Secondary | ICD-10-CM | POA: Diagnosis not present

## 2018-03-24 MED ORDER — MEDROXYPROGESTERONE ACETATE 150 MG/ML IM SUSP
150.0000 mg | Freq: Once | INTRAMUSCULAR | Status: AC
  Administered 2018-03-24: 150 mg via INTRAMUSCULAR

## 2018-03-24 NOTE — Progress Notes (Signed)
I have reviewed the chart and agree with nursing staff's documentation of this patient's encounter.  Catalina AntiguaPeggy Ahmeer Tuman, MD 03/24/2018 11:31 AM

## 2018-03-24 NOTE — Progress Notes (Signed)
Nurse visit for pt supplied Depo given RUOQ w/o difficulty. Pt on time for inj. She's aware AEX due next month 04/14/2018.

## 2018-04-16 ENCOUNTER — Ambulatory Visit: Payer: Medicaid Other | Admitting: Certified Nurse Midwife

## 2018-05-07 ENCOUNTER — Encounter: Payer: Self-pay | Admitting: Certified Nurse Midwife

## 2018-05-07 ENCOUNTER — Ambulatory Visit (INDEPENDENT_AMBULATORY_CARE_PROVIDER_SITE_OTHER): Payer: Medicaid Other | Admitting: Certified Nurse Midwife

## 2018-05-07 VITALS — BP 109/70 | HR 68 | Ht 65.0 in | Wt 188.7 lb

## 2018-05-07 DIAGNOSIS — Z0000 Encounter for general adult medical examination without abnormal findings: Secondary | ICD-10-CM

## 2018-05-07 DIAGNOSIS — Z01419 Encounter for gynecological examination (general) (routine) without abnormal findings: Secondary | ICD-10-CM

## 2018-05-07 DIAGNOSIS — Z3042 Encounter for surveillance of injectable contraceptive: Secondary | ICD-10-CM

## 2018-05-07 MED ORDER — MEDROXYPROGESTERONE ACETATE 150 MG/ML IM SUSP: 150.0000 mg | INTRAMUSCULAR | 4 refills | Status: DC

## 2018-05-07 NOTE — Patient Instructions (Addendum)
[facility] [address] [place], [place] [address]  [phone]   Call to make appointment due to moving to [facility] and wants facility closer to home

## 2018-05-07 NOTE — Progress Notes (Signed)
Patient presents for Annual Exam today.  CC: NONE   Last pap:04/14/2017 WNL  LMP: No cycle w/ Depo Contraception: Depo last given 03/24/2018 STD Screening: Declines per pt not sexually active

## 2018-05-07 NOTE — Progress Notes (Signed)
GYNECOLOGY ANNUAL PREVENTATIVE CARE ENCOUNTER NOTE  Subjective:   Katelyn Bauer is a 28 y.o. G33P2002 female here for a routine annual gynecologic exam.  Current complaints: none.   Denies abnormal vaginal bleeding, discharge, pelvic pain or other gynecologic concerns.    Gynecologic History No LMP recorded. Patient has had an injection. Contraception: Depo-Provera injections Last Pap: 2019. Results were: normal with negative HPV  Obstetric History OB History  Gravida Para Term Preterm AB Living  2 2 2  0 0 2  SAB TAB Ectopic Multiple Live Births  0 0 0 0 2    # Outcome Date GA Lbr Len/2nd Weight Sex Delivery Anes PTL Lv  2 Term 03/20/16 [redacted]w[redacted]d 12:02 / 06:23 7 lb 5.1 oz (3.32 kg) F Vag-Spont EPI  LIV  1 Term 10/17/13   6 lb 11.2 oz (3.039 kg) F Vag-Spont EPI N LIV    Obstetric Comments  Delivered in [place].  History of Twins runs in the family. Patient is a twin And her father is a twin.    Past Medical History:  Diagnosis Date  . Medical history non-contributory     Past Surgical History:  Procedure Laterality Date  . NO PAST SURGERIES      Current Outpatient Medications on File Prior to Visit  Medication Sig Dispense Refill  . acetaminophen (TYLENOL) 325 MG tablet Take 2 tablets (650 mg total) by mouth every 6 (six) hours as needed. 30 tablet 0  . Iron Polysacch Cmplx-B12-FA 150-0.025-1 MG CAPS Take 1 capsule by mouth daily.    . Prenatal Vit-Fe Phos-FA-Omega (VITAFOL GUMMIES) 3.33-0.333-34.8 MG CHEW Chew by mouth.    . Vitamin D, Ergocalciferol, (DRISDOL) 50000 units CAPS capsule Take 1 capsule (50,000 Units total) by mouth every 7 (seven) days. 30 capsule 2  . medroxyPROGESTERone Acetate 150 MG/ML SUSY Inject 150 mg as directed every 3 (three) months.     No current facility-administered medications on file prior to visit.     Allergies  Allergen Reactions  . Penicillins Swelling and Other (See Comments)    Reaction:  All over body swelling  Has patient  had a PCN reaction causing immediate rash, facial/tongue/throat swelling, SOB or lightheadedness with hypotension: Yes Has patient had a PCN reaction causing severe rash involving mucus membranes or skin necrosis: No Has patient had a PCN reaction that required hospitalization No Has patient had a PCN reaction occurring within the last 10 years: No If all of the above answers are "NO", then may proceed with Cephalosporin use.      Social History:  reports that she has quit smoking. She has never used smokeless tobacco. She reports that she does not drink alcohol or use drugs.  Family History  Problem Relation Age of Onset  . Lung cancer Paternal Grandmother     The following portions of the patient's history were reviewed and updated as appropriate: allergies, current medications, past family history, past medical history, past social history, past surgical history and problem list.  Review of Systems Pertinent items noted in HPI and remainder of comprehensive ROS otherwise negative.   Objective:  BP 109/70   Pulse 68   Ht 5\' 5"  (1.651 m)   Wt 188 lb 11.2 oz (85.6 kg)   BMI 31.40 kg/m  CONSTITUTIONAL: Well-developed, well-nourished female in no acute distress.  HENT:  Normocephalic, atraumatic, External right and left ear normal. Oropharynx is clear and moist EYES: Conjunctivae and EOM are normal. Pupils are equal, round, and reactive to light.  NECK: Normal range of motion, supple, no masses.  Normal thyroid.  SKIN: Skin is warm and dry. No rash noted. Not diaphoretic. No erythema. No pallor. MUSCULOSKELETAL: Normal range of motion. No tenderness.  No cyanosis, clubbing, or edema.  2+ distal pulses. NEUROLOGIC: Alert and oriented to person, place, and time. Normal reflexes, muscle tone coordination. No cranial nerve deficit noted. PSYCHIATRIC: Normal mood and affect. Normal behavior. Normal judgment and thought content. CARDIOVASCULAR: Normal heart rate noted, regular  rhythm RESPIRATORY: Clear to auscultation bilaterally. Effort and breath sounds normal, no problems with respiration noted. BREASTS: Symmetric in size. No masses, skin changes, nipple drainage, or lymphadenopathy. ABDOMEN: Soft, normal bowel sounds, no distention noted.  No tenderness, rebound or guarding.  PELVIC: Normal appearing external genitalia; normal appearing vaginal mucosa. Ecotropic cervix.  No abnormal discharge noted.  Pap smear obtained.  Normal uterine size, no other palpable masses, no uterine or adnexal tenderness.  Assessment and Plan:  1. Encounter for annual routine gynecological examination - Normal well woman examination  - Patient declines STD screening, denies having IC   2. Encounter for surveillance of injectable contraceptive - Patient has no complaints or concerns with Depo, wants to continue Depo injections  - medroxyPROGESTERone (DEPO-PROVERA) 150 MG/ML injection; Inject 1 mL (150 mg total) into the muscle every 3 (three) months.  Dispense: 1 mL; Refill: 4  Will follow up results of pap smear and manage accordingly. Routine preventative health maintenance measures emphasized. Please refer to After Visit Summary for other counseling recommendations.    Sharyon Cable, CNM [facility] [employer]

## 2018-05-11 LAB — CYTOLOGY - PAP

## 2018-06-16 ENCOUNTER — Ambulatory Visit: Payer: Medicaid Other

## 2018-06-24 ENCOUNTER — Other Ambulatory Visit: Payer: Self-pay

## 2018-06-24 ENCOUNTER — Ambulatory Visit (INDEPENDENT_AMBULATORY_CARE_PROVIDER_SITE_OTHER): Payer: Medicaid Other

## 2018-06-24 DIAGNOSIS — Z3042 Encounter for surveillance of injectable contraceptive: Secondary | ICD-10-CM

## 2018-06-24 MED ORDER — MEDROXYPROGESTERONE ACETATE 150 MG/ML IM SUSP
150.0000 mg | INTRAMUSCULAR | Status: DC
  Administered 2018-06-24 – 2018-12-14 (×3): 150 mg via INTRAMUSCULAR

## 2018-06-24 NOTE — Progress Notes (Signed)
Pt is in the office for depo injection, administered LUOQ and pt tolerated well. .. Administrations This Visit    medroxyPROGESTERone (DEPO-PROVERA) injection 150 mg    Admin Date 06/24/2018 Action Given Dose 150 mg Route Intramuscular Administered By Darcey Cardy D, RN         

## 2018-06-25 NOTE — Progress Notes (Signed)
I have reviewed the chart and agree with nursing staff's documentation of this patient's encounter.  Catalina Antigua, MD 06/25/2018 11:21 AM

## 2018-09-11 ENCOUNTER — Ambulatory Visit: Payer: Medicaid Other

## 2018-09-21 ENCOUNTER — Other Ambulatory Visit: Payer: Self-pay

## 2018-09-21 ENCOUNTER — Ambulatory Visit (INDEPENDENT_AMBULATORY_CARE_PROVIDER_SITE_OTHER): Payer: Medicaid Other

## 2018-09-21 DIAGNOSIS — Z3042 Encounter for surveillance of injectable contraceptive: Secondary | ICD-10-CM | POA: Diagnosis not present

## 2018-09-21 NOTE — Progress Notes (Signed)
Nurse visit for Depo. Pt is within her window.  Depo given RUOQ w/o difficulty.  Next inj due Sept 14-28, pt agrees.

## 2018-09-24 NOTE — Progress Notes (Signed)
Patient ID: Katelyn Bauer, female   DOB: May 26, 1990, 28 y.o.   MRN: [id number] I have reviewed the chart and agree with nursing staff's documentation of this patient's encounter.  Emeterio Reeve, MD 09/24/2018 10:33 AM

## 2018-12-10 ENCOUNTER — Ambulatory Visit: Payer: Medicaid Other

## 2018-12-14 ENCOUNTER — Other Ambulatory Visit: Payer: Self-pay

## 2018-12-14 ENCOUNTER — Ambulatory Visit (INDEPENDENT_AMBULATORY_CARE_PROVIDER_SITE_OTHER): Payer: Medicaid Other | Admitting: *Deleted

## 2018-12-14 DIAGNOSIS — Z3042 Encounter for surveillance of injectable contraceptive: Secondary | ICD-10-CM

## 2018-12-14 DIAGNOSIS — R8290 Unspecified abnormal findings in urine: Secondary | ICD-10-CM

## 2018-12-14 LAB — POCT URINALYSIS DIPSTICK
Bilirubin, UA: NEGATIVE
Glucose, UA: NEGATIVE
Ketones, UA: NEGATIVE
Leukocytes, UA: NEGATIVE
Nitrite, UA: NEGATIVE
Protein, UA: NEGATIVE
Spec Grav, UA: 1.01 (ref 1.010–1.025)
Urobilinogen, UA: 0.2 E.U./dL
pH, UA: 7 (ref 5.0–8.0)

## 2018-12-14 NOTE — Progress Notes (Signed)
Pt is in office for Depo injection.  Pt is on time for Depo. Injection given, pt tolerated well. Pt states she is also having strong foul smell to her urine. Pt denies any other symptoms of UTI.  Pt will leave sample today and will send for UC. Pt made aware tx will be based on results.   Pt made aware to RTO 12/7-12/21 for next depo.  Pt states she plans to join the [employer], may be out of state at that point.    Pt states understanding.  Urine dipstick shows negative for nitrites, leukocytes, glucose, protein, ketones, positive for red blood cells.   Administrations This Visit    medroxyPROGESTERone (DEPO-PROVERA) injection 150 mg    Admin Date 12/14/2018 Action Given Dose 150 mg Route Intramuscular Administered By Valene Bors, CMA

## 2018-12-17 ENCOUNTER — Other Ambulatory Visit: Payer: Self-pay | Admitting: Obstetrics

## 2018-12-17 DIAGNOSIS — B9620 Unspecified Escherichia coli [E. coli] as the cause of diseases classified elsewhere: Secondary | ICD-10-CM

## 2018-12-17 DIAGNOSIS — N390 Urinary tract infection, site not specified: Secondary | ICD-10-CM

## 2018-12-17 LAB — URINE CULTURE

## 2018-12-17 MED ORDER — NITROFURANTOIN MONOHYD MACRO 100 MG PO CAPS: 100.0000 mg | ORAL_CAPSULE | Freq: Two times a day (BID) | ORAL | 2 refills | Status: DC

## 2019-01-04 ENCOUNTER — Other Ambulatory Visit: Payer: Self-pay

## 2019-01-04 DIAGNOSIS — B9620 Unspecified Escherichia coli [E. coli] as the cause of diseases classified elsewhere: Secondary | ICD-10-CM

## 2019-01-04 DIAGNOSIS — N390 Urinary tract infection, site not specified: Secondary | ICD-10-CM

## 2019-01-04 MED ORDER — NITROFURANTOIN MONOHYD MACRO 100 MG PO CAPS: 100.0000 mg | ORAL_CAPSULE | Freq: Two times a day (BID) | ORAL | 0 refills | Status: DC

## 2019-01-04 NOTE — Telephone Encounter (Signed)
Patient called stating she didn't understand her urine results. (from 12-17-18) patient made aware that she had a uti and antibiotics had been sent in for her to take.   Patient requesting antibiotic be sent to [facility] on [address]. Will resend there. Kathrene Alu RN

## 2019-03-04 ENCOUNTER — Ambulatory Visit: Payer: Medicaid Other

## 2019-03-12 ENCOUNTER — Ambulatory Visit (INDEPENDENT_AMBULATORY_CARE_PROVIDER_SITE_OTHER): Payer: Medicaid Other

## 2019-03-12 ENCOUNTER — Other Ambulatory Visit: Payer: Self-pay

## 2019-03-12 DIAGNOSIS — Z3042 Encounter for surveillance of injectable contraceptive: Secondary | ICD-10-CM

## 2019-03-12 MED ORDER — MEDROXYPROGESTERONE ACETATE 150 MG/ML IM SUSP: 150.0000 mg | Freq: Once | INTRAMUSCULAR | Status: DC

## 2019-03-12 NOTE — Progress Notes (Signed)
Patient seen and assessed by nursing staff during this encounter. I have reviewed the chart and agree with the documentation and plan.  Kerry Hough, PA-C 03/12/2019 9:24 AM

## 2019-03-12 NOTE — Progress Notes (Signed)
Pt presents for depo inj. Pt is within her window. Inj given in RUOQ. Pt tolerated well. Next depo due 3/5-3/19.

## 2019-05-14 ENCOUNTER — Ambulatory Visit: Payer: Medicaid Other | Admitting: Certified Nurse Midwife

## 2019-06-07 ENCOUNTER — Ambulatory Visit: Payer: Medicaid Other

## 2019-06-10 ENCOUNTER — Ambulatory Visit: Payer: Medicaid Other | Admitting: Family Medicine

## 2019-06-14 ENCOUNTER — Other Ambulatory Visit: Payer: Self-pay | Admitting: Certified Nurse Midwife

## 2019-06-14 ENCOUNTER — Other Ambulatory Visit: Payer: Self-pay | Admitting: *Deleted

## 2019-06-14 DIAGNOSIS — Z3042 Encounter for surveillance of injectable contraceptive: Secondary | ICD-10-CM

## 2019-06-14 MED ORDER — MEDROXYPROGESTERONE ACETATE 150 MG/ML IM SUSP: 150.0000 mg | INTRAMUSCULAR | 0 refills | Status: DC

## 2019-06-16 ENCOUNTER — Ambulatory Visit: Payer: Medicaid Other | Admitting: Family Medicine

## 2019-06-17 ENCOUNTER — Ambulatory Visit (INDEPENDENT_AMBULATORY_CARE_PROVIDER_SITE_OTHER): Payer: Medicaid Other | Admitting: Family Medicine

## 2019-06-17 ENCOUNTER — Encounter: Payer: Self-pay | Admitting: Family Medicine

## 2019-06-17 ENCOUNTER — Other Ambulatory Visit (HOSPITAL_COMMUNITY)
Admission: RE | Admit: 2019-06-17 | Discharge: 2019-06-17 | Disposition: A | Discharge status: Home / Self Care | Payer: Medicaid Other | Source: Ambulatory Visit | Attending: Family Medicine | Admitting: Family Medicine

## 2019-06-17 ENCOUNTER — Other Ambulatory Visit: Payer: Self-pay

## 2019-06-17 VITALS — BP 116/78 | HR 98 | Temp 98.6°F | Ht 65.0 in | Wt 189.2 lb

## 2019-06-17 DIAGNOSIS — Z23 Encounter for immunization: Secondary | ICD-10-CM

## 2019-06-17 DIAGNOSIS — Z3042 Encounter for surveillance of injectable contraceptive: Secondary | ICD-10-CM | POA: Diagnosis not present

## 2019-06-17 DIAGNOSIS — Z01419 Encounter for gynecological examination (general) (routine) without abnormal findings: Secondary | ICD-10-CM

## 2019-06-17 MED ORDER — MEDROXYPROGESTERONE ACETATE 150 MG/ML IM SUSP: 150.0000 mg | INTRAMUSCULAR | 3 refills | Status: DC

## 2019-06-17 MED ORDER — MEDROXYPROGESTERONE ACETATE 150 MG/ML IM SUSP
150.0000 mg | Freq: Once | INTRAMUSCULAR | Status: AC
  Administered 2019-06-17: 150 mg via INTRAMUSCULAR

## 2019-06-17 NOTE — Progress Notes (Signed)
Pt presents for annual and pap smear. Unsatisfactory pap smear 05/07/2018 STD testing offered; pt declined Depo due today

## 2019-06-17 NOTE — Progress Notes (Signed)
GYNECOLOGY ANNUAL PREVENTATIVE CARE ENCOUNTER NOTE  Subjective:   Katelyn Bauer is a 29 y.o. G47P2002 female here for a annual gynecologic exam. Current complaints: none.   Denies abnormal vaginal bleeding, discharge, pelvic pain, problems with intercourse or other gynecologic concerns.    Gynecologic History No LMP recorded. Patient has had an injection. Contraception: Depo and Abstinence Last Pap: 2020. Results were: unsatisfactory Last mammogram: n/a Gardisil: has not received  Obstetric History OB History  Gravida Para Term Preterm AB Living  2 2 2  0 0 2  SAB TAB Ectopic Multiple Live Births  0 0 0 0 2    # Outcome Date GA Lbr Len/2nd Weight Sex Delivery Anes PTL Lv  2 Term 03/20/16 [redacted]w[redacted]d 12:02 / 06:23 7 lb 5.1 oz (3.32 kg) F Vag-Spont EPI  LIV  1 Term 10/17/13   6 lb 11.2 oz (3.039 kg) F Vag-Spont EPI N LIV    Obstetric Comments  Delivered in 10/19/13.  History of Twins runs in the family. Patient is a twin And her father is a twin.    Past Medical History:  Diagnosis Date  . Medical history non-contributory     Past Surgical History:  Procedure Laterality Date  . NO PAST SURGERIES      Current Outpatient Medications on File Prior to Visit  Medication Sig Dispense Refill  . medroxyPROGESTERone (DEPO-PROVERA) 150 MG/ML injection Inject 1 mL (150 mg total) into the muscle every 3 (three) months. 1 mL 0  . acetaminophen (TYLENOL) 325 MG tablet Take 2 tablets (650 mg total) by mouth every 6 (six) hours as needed. 30 tablet 0  . Iron Polysacch Cmplx-B12-FA 150-0.025-1 MG CAPS Take 1 capsule by mouth daily.    . nitrofurantoin, macrocrystal-monohydrate, (MACROBID) 100 MG capsule Take 1 capsule (100 mg total) by mouth 2 (two) times daily. 1 po BID x 7days 14 capsule 0  . Prenatal Vit-Fe Phos-FA-Omega (VITAFOL GUMMIES) 3.33-0.333-34.8 MG CHEW Chew by mouth.    . Vitamin D, Ergocalciferol, (DRISDOL) 50000 units CAPS capsule Take 1 capsule (50,000 Units total) by mouth  every 7 (seven) days. 30 capsule 2   No current facility-administered medications on file prior to visit.    Allergies  Allergen Reactions  . Penicillins Swelling and Other (See Comments)    Reaction:  All over body swelling  Has patient had a PCN reaction causing immediate rash, facial/tongue/throat swelling, SOB or lightheadedness with hypotension: Yes Has patient had a PCN reaction causing severe rash involving mucus membranes or skin necrosis: No Has patient had a PCN reaction that required hospitalization No Has patient had a PCN reaction occurring within the last 10 years: No If all of the above answers are "NO", then may proceed with Cephalosporin use.      Social History   Socioeconomic History  . Marital status: Single    Spouse name: Not on file  . Number of children: Not on file  . Years of education: Not on file  . Highest education level: Not on file  Occupational History  . Not on file  Tobacco Use  . Smoking status: Former 05-07-1979  . Smokeless tobacco: Never Used  Substance and Sexual Activity  . Alcohol use: No    Alcohol/week: 0.0 standard drinks    Comment: Occasional  . Drug use: No  . Sexual activity: Not Currently    Partners: Male    Birth control/protection: Injection, Abstinence  Other Topics Concern  . Not on file  Social History Narrative  .  Not on file   Social Determinants of Health   Financial Resource Strain:   . Difficulty of Paying Living Expenses:   Food Insecurity:   . Worried About Programme researcher, broadcasting/film/video in the Last Year:   . Barista in the Last Year:   Transportation Needs:   . Freight forwarder (Medical):   Marland Kitchen Lack of Transportation (Non-Medical):   Physical Activity:   . Days of Exercise per Week:   . Minutes of Exercise per Session:   Stress:   . Feeling of Stress :   Social Connections:   . Frequency of Communication with Friends and Family:   . Frequency of Social Gatherings with Friends and Family:   .  Attends Religious Services:   . Active Member of Clubs or Organizations:   . Attends Banker Meetings:   Marland Kitchen Marital Status:   Intimate Partner Violence:   . Fear of Current or Ex-Partner:   . Emotionally Abused:   Marland Kitchen Physically Abused:   . Sexually Abused:     Family History  Problem Relation Age of Onset  . Lung cancer Paternal Grandmother        2nd hand smoking   . Brain cancer Paternal Grandmother     Diet: reports varied Exercise: reports  The following portions of the patient's history were reviewed and updated as appropriate: allergies, current medications, past family history, past medical history, past social history, past surgical history and problem list.  Review of Systems Pertinent items are noted in HPI.   Objective:  BP 116/78   Pulse 98   Temp 98.6 F (37 C)   Ht 5\' 5"  (1.651 m)   Wt 189 lb 3.2 oz (85.8 kg)   BMI 31.48 kg/m  CONSTITUTIONAL: Well-developed, well-nourished female in no acute distress.  HENT:  Normocephalic, atraumatic, External right and left ear normal. Oropharynx is clear and moist EYES: Conjunctivae and EOM are normal. Pupils are equal, round, and reactive to light. No scleral icterus.  NECK: Normal range of motion, supple, no masses.  Normal thyroid.  SKIN: Skin is warm and dry. No rash noted. Not diaphoretic. No erythema. No pallor. NEUROLOGIC: Alert and oriented to person, place, and time. Normal reflexes, muscle tone coordination. No cranial nerve deficit noted. PSYCHIATRIC: Normal mood and affect. Normal behavior. Normal judgment and thought content. CARDIOVASCULAR: Normal heart rate noted, regular rhythm RESPIRATORY: Clear to auscultation bilaterally. Effort and breath sounds normal, no problems with respiration noted. BREASTS: Symmetric in size.  ABDOMEN: Soft, normal bowel sounds, no distention noted.  No tenderness, rebound or guarding.  PELVIC: Normal appearing external genitalia; normal appearing vaginal mucosa and  cervix.  No abnormal discharge noted.  Pap smear obtained.  Normal uterine size, no other palpable masses, no uterine or adnexal tenderness. MUSCULOSKELETAL: Normal range of motion. No tenderness.  No cyanosis, clubbing, or edema.  2+ distal pulses.  Exam done with chaperone present.  Assessment and Plan:   1. Well woman exam with routine gynecological exam - Cytology - PAP( [facility]) - Declines STI testing at this time  Will follow up results of pap smear and manage accordingly. Encouraged improvement in diet and exercise.  -Flu vaccine declined today -Gardisil #1 administered today  Routine preventative health maintenance measures emphasized. Please refer to After Visit Summary for other counseling recommendations.   2. Encounter for surveillance of injectable contraceptive - medroxyPROGESTERone (DEPO-PROVERA) injection 150 mg - medroxyPROGESTERone (DEPO-PROVERA) 150 MG/ML injection; Inject 1 mL (150 mg  total) into the muscle every 3 (three) months.  Dispense: 1 mL; Refill: 3  3. Need for vaccination - recommended [facility] for HPV vaccine   Total face-to-face time with patient: 17 minutes. Over 50% of encounter was spent on counseling and coordination of care.  Merilyn Baba, DO [facility] Fellow, [facility] 06/17/2019 3:40 PM

## 2019-06-17 NOTE — Patient Instructions (Addendum)
Pap Test Why am I having this test? A Pap test, also called a Pap smear, is a screening test to check for signs of:  Cancer of the vagina, cervix, and uterus. The cervix is the lower part of the uterus that opens into the vagina.  Infection.  Changes that may be a sign that cancer is developing (precancerous changes). Women need this test on a regular basis. In general, you should have a Pap test every 3 years until you reach menopause or age 28. Women aged 30-60 may choose to have their Pap test done at the same time as an HPV (human papillomavirus) test every 5 years (instead of every 3 years). Your health care provider may recommend having Pap tests more or less often depending on your medical conditions and past Pap test results. What kind of sample is taken?  Your health care provider will collect a sample of cells from the surface of your cervix. This will be done using a small cotton swab, plastic spatula, or brush. This sample is often collected during a pelvic exam, when you are lying on your back on an exam table with feet in footrests (stirrups). In some cases, fluids (secretions) from the cervix or vagina may also be collected. How do I prepare for this test?  Be aware of where you are in your menstrual cycle. If you are menstruating on the day of the test, you may be asked to reschedule.  You may need to reschedule if you have a known vaginal infection on the day of the test.  Follow instructions from your health care provider about: ? Changing or stopping your regular medicines. Some medicines can cause abnormal test results, such as digitalis and tetracycline. ? Avoiding douching or taking a bath the day before or the day of the test. Tell a health care provider about:  Any allergies you have.  All medicines you are taking, including vitamins, herbs, eye drops, creams, and over-the-counter medicines.  Any blood disorders you have.  Any surgeries you have had.  Any  medical conditions you have.  Whether you are pregnant or may be pregnant. How are the results reported? Your test results will be reported as either abnormal or normal. A false-positive result can occur. A false positive is incorrect because it means that a condition is present when it is not. A false-negative result can occur. A false negative is incorrect because it means that a condition is not present when it is. What do the results mean? A normal test result means that you do not have signs of cancer of the vagina, cervix, or uterus. An abnormal result may mean that you have:  Cancer. A Pap test by itself is not enough to diagnose cancer. You will have more tests done in this case.  Precancerous changes in your vagina, cervix, or uterus.  Inflammation of the cervix.  An STD (sexually transmitted disease).  A fungal infection.  A parasite infection. Talk with your health care provider about what your results mean. Questions to ask your health care provider Ask your health care provider, or the department that is doing the test:  When will my results be ready?  How will I get my results?  What are my treatment options?  What other tests do I need?  What are my next steps? Summary  In general, women should have a Pap test every 3 years until they reach menopause or age 28.  Your health care provider will collect a   sample of cells from the surface of your cervix. This will be done using a small cotton swab, plastic spatula, or brush.  In some cases, fluids (secretions) from the cervix or vagina may also be collected. This information is not intended to replace advice given to you by your health care provider. Make sure you discuss any questions you have with your health care provider. Document Revised: 11/18/2016 Document Reviewed: 11/18/2016 Elsevier Patient Education  2020 Elsevier Inc.  Human Papillomavirus Quadrivalent Vaccine suspension for injection What is  this medicine? HUMAN PAPILLOMAVIRUS VACCINE (HYOO muhn pap uh LOH muh vahy ruhs vak SEEN) is a vaccine. It is used to prevent infections of four types of the human papillomavirus. In women, the vaccine may lower your risk of getting cervical, vaginal, vulvar, or anal cancer and genital warts. In men, the vaccine may lower your risk of getting genital warts and anal cancer. You cannot get these diseases from the vaccine. This vaccine does not treat these diseases. This medicine may be used for other purposes; ask your health care provider or pharmacist if you have questions. COMMON BRAND NAME(S): Gardasil What should I tell my health care provider before I take this medicine? They need to know if you have any of these conditions:  fever or infection  hemophilia  HIV infection or AIDS  immune system problems  low platelet count  an unusual reaction to Human Papillomavirus Vaccine, yeast, other medicines, foods, dyes, or preservatives  pregnant or trying to get pregnant  breast-feeding How should I use this medicine? This vaccine is for injection in a muscle on your upper arm or thigh. It is given by a health care professional. You will be observed for 15 minutes after each dose. Sometimes, fainting happens after the vaccine is given. You may be asked to sit or lie down during the 15 minutes. Three doses are given. The second dose is given 2 months after the first dose. The last dose is given 4 months after the second dose. A copy of a Vaccine Information Statement will be given before each vaccination. Read this sheet carefully each time. The sheet may change frequently. Talk to your pediatrician regarding the use of this medicine in children. While this drug may be prescribed for children as young as 9 years of age for selected conditions, precautions do apply. Overdosage: If you think you have taken too much of this medicine contact a poison control center or emergency room at once. NOTE:  This medicine is only for you. Do not share this medicine with others. What if I miss a dose? All 3 doses of the vaccine should be given within 6 months. Remember to keep appointments for follow-up doses. Your health care provider will tell you when to return for the next vaccine. Ask your health care professional for advice if you are unable to keep an appointment or miss a scheduled dose. What may interact with this medicine?  other vaccines This list may not describe all possible interactions. Give your health care provider a list of all the medicines, herbs, non-prescription drugs, or dietary supplements you use. Also tell them if you smoke, drink alcohol, or use illegal drugs. Some items may interact with your medicine. What should I watch for while using this medicine? This vaccine may not fully protect everyone. Continue to have regular pelvic exams and cervical or anal cancer screenings as directed by your doctor. The Human Papillomavirus is a sexually transmitted disease. It can be passed by any   kind of sexual activity that involves genital contact. The vaccine works best when given before you have any contact with the virus. Many people who have the virus do not have any signs or symptoms. Tell your doctor or health care professional if you have any reaction or unusual symptom after getting the vaccine. What side effects may I notice from receiving this medicine? Side effects that you should report to your doctor or health care professional as soon as possible:  allergic reactions like skin rash, itching or hives, swelling of the face, lips, or tongue  breathing problems  feeling faint or lightheaded, falls Side effects that usually do not require medical attention (report to your doctor or health care professional if they continue or are bothersome):  cough  dizziness  fever  headache  nausea  redness, warmth, swelling, pain, or itching at site where injected This list may  not describe all possible side effects. Call your doctor for medical advice about side effects. You may report side effects to FDA at 1-800-FDA-1088. Where should I keep my medicine? This drug is given in a hospital or clinic and will not be stored at home. NOTE: This sheet is a summary. It may not cover all possible information. If you have questions about this medicine, talk to your doctor, pharmacist, or health care provider.  2020 Elsevier/Gold Standard (2013-05-03 13:14:33)  

## 2019-06-21 LAB — CYTOLOGY - PAP
Adequacy: ABSENT
Diagnosis: NEGATIVE

## 2019-09-08 ENCOUNTER — Ambulatory Visit: Payer: Medicaid Other

## 2019-09-10 ENCOUNTER — Ambulatory Visit (INDEPENDENT_AMBULATORY_CARE_PROVIDER_SITE_OTHER): Payer: Medicaid Other

## 2019-09-10 ENCOUNTER — Other Ambulatory Visit: Payer: Self-pay

## 2019-09-10 VITALS — BP 105/68 | HR 70 | Ht 65.0 in | Wt 183.0 lb

## 2019-09-10 DIAGNOSIS — Z3042 Encounter for surveillance of injectable contraceptive: Secondary | ICD-10-CM

## 2019-09-10 MED ORDER — MEDROXYPROGESTERONE ACETATE 150 MG/ML IM SUSP
150.0000 mg | Freq: Once | INTRAMUSCULAR | Status: AC
  Administered 2019-09-10: 150 mg via INTRAMUSCULAR

## 2019-09-10 NOTE — Progress Notes (Signed)
ATTESTATION OF SUPERVISION OF RN: Evaluation and management procedures were performed by the RN under my supervision and collaboration. I have reviewed the nursing note and chart and agree with the management and plan for this patient.  Farron Lafond, CNM  

## 2019-09-10 NOTE — Progress Notes (Signed)
GYN presents for DEPO, given in RUOQ, tolerated well.  Next DEPO Sept. 3-17/2021  Administrations This Visit    medroxyPROGESTERone (DEPO-PROVERA) injection 150 mg    Admin Date 09/10/2019 Action Given Dose 150 mg Route Intramuscular Administered By Maretta Bees, RMA

## 2019-12-01 ENCOUNTER — Ambulatory Visit (INDEPENDENT_AMBULATORY_CARE_PROVIDER_SITE_OTHER): Payer: Medicaid Other

## 2019-12-01 ENCOUNTER — Other Ambulatory Visit: Payer: Self-pay

## 2019-12-01 VITALS — BP 108/71 | HR 66 | Wt 179.0 lb

## 2019-12-01 DIAGNOSIS — Z3042 Encounter for surveillance of injectable contraceptive: Secondary | ICD-10-CM

## 2019-12-01 MED ORDER — MEDROXYPROGESTERONE ACETATE 150 MG/ML IM SUSP
150.0000 mg | Freq: Once | INTRAMUSCULAR | Status: AC
  Administered 2019-12-01: 150 mg via INTRAMUSCULAR

## 2019-12-01 NOTE — Progress Notes (Signed)
GYN presents for DEPO Injection, given in LUOQ, tolerated well.  Last PAP 06/17/2019  Next DEPO Nov. 24 - Dec. 8, 2021  Administrations This Visit    medroxyPROGESTERone (DEPO-PROVERA) injection 150 mg    Admin Date 12/01/2019 Action Given Dose 150 mg Route Intramuscular Administered By Maretta Bees, RMA

## 2019-12-01 NOTE — Progress Notes (Signed)
I have reviewed the chart and agree with the nurse's note and plan of care for this visit.   Sharen Counter, CNM 12:02 PM

## 2020-02-21 ENCOUNTER — Ambulatory Visit: Payer: Medicaid Other

## 2020-02-23 ENCOUNTER — Other Ambulatory Visit: Payer: Self-pay

## 2020-02-23 ENCOUNTER — Ambulatory Visit (INDEPENDENT_AMBULATORY_CARE_PROVIDER_SITE_OTHER): Payer: Medicaid Other

## 2020-02-23 ENCOUNTER — Ambulatory Visit: Payer: Medicaid Other

## 2020-02-23 VITALS — BP 115/74 | HR 76

## 2020-02-23 DIAGNOSIS — Z3042 Encounter for surveillance of injectable contraceptive: Secondary | ICD-10-CM

## 2020-02-23 MED ORDER — MEDROXYPROGESTERONE ACETATE 150 MG/ML IM SUSP
150.0000 mg | Freq: Once | INTRAMUSCULAR | Status: AC
  Administered 2020-02-23: 150 mg via INTRAMUSCULAR

## 2020-02-23 NOTE — Progress Notes (Signed)
Patient presents for depo injection. Patient is within her window. Inj given in RUOQ. Patient tolerated well. Next depo due Feb 16 - Mar 2.

## 2020-02-23 NOTE — Progress Notes (Signed)
Patient was assessed and managed by nursing staff during this encounter. I have reviewed the chart and agree with the documentation and plan. I have also made any necessary editorial changes.  Warden Fillers, MD 02/23/2020 8:40 AM

## 2020-03-15 ENCOUNTER — Ambulatory Visit: Payer: Medicaid Other | Admitting: Obstetrics

## 2020-05-11 ENCOUNTER — Ambulatory Visit: Payer: Medicaid Other

## 2020-05-15 ENCOUNTER — Other Ambulatory Visit: Payer: Self-pay

## 2020-05-15 ENCOUNTER — Ambulatory Visit (INDEPENDENT_AMBULATORY_CARE_PROVIDER_SITE_OTHER): Payer: Medicaid Other

## 2020-05-15 DIAGNOSIS — Z3042 Encounter for surveillance of injectable contraceptive: Secondary | ICD-10-CM

## 2020-05-15 MED ORDER — MEDROXYPROGESTERONE ACETATE 150 MG/ML IM SUSP
150.0000 mg | Freq: Once | INTRAMUSCULAR | Status: AC
  Administered 2020-05-15: 150 mg via INTRAMUSCULAR

## 2020-05-15 NOTE — Progress Notes (Signed)
Patient was assessed and managed by nursing staff during this encounter. I have reviewed the chart and agree with the documentation and plan. I have also made any necessary editorial changes.  Catalina Antigua, MD 05/15/2020 9:00 AM

## 2020-05-15 NOTE — Progress Notes (Signed)
Patient presents for depo inj. Patient is within her window. Inj given in LUOQ. Patient tolerated well. Next depo due 5/5-5/23.

## 2020-08-02 ENCOUNTER — Other Ambulatory Visit: Payer: Self-pay

## 2020-08-02 DIAGNOSIS — Z3042 Encounter for surveillance of injectable contraceptive: Secondary | ICD-10-CM

## 2020-08-02 MED ORDER — MEDROXYPROGESTERONE ACETATE 150 MG/ML IM SUSP: 150.0000 mg | INTRAMUSCULAR | 0 refills | Status: DC

## 2020-08-02 NOTE — Telephone Encounter (Signed)
Refill on depo-provera. Patient has upcoming appointment the end of May.

## 2020-08-04 ENCOUNTER — Ambulatory Visit (INDEPENDENT_AMBULATORY_CARE_PROVIDER_SITE_OTHER): Payer: Medicaid Other

## 2020-08-04 ENCOUNTER — Other Ambulatory Visit: Payer: Self-pay

## 2020-08-04 DIAGNOSIS — Z3042 Encounter for surveillance of injectable contraceptive: Secondary | ICD-10-CM

## 2020-08-04 MED ORDER — MEDROXYPROGESTERONE ACETATE 150 MG/ML IM SUSP
150.0000 mg | Freq: Once | INTRAMUSCULAR | Status: AC
  Administered 2020-08-04: 150 mg via INTRAMUSCULAR

## 2020-08-04 NOTE — Progress Notes (Signed)
Subjective:  Pt in for Depo Provera injection.    Objective: Need for contraception. No unusual complaints.    Assessment: Depo given R upper outer quadrant. Patient tolerated injection    Plan:  Next injection due Jul 29-Aug 12

## 2020-08-07 NOTE — Progress Notes (Signed)
Attestation of Attending Supervision of clinical support staff: I agree with the care provided to this patient and was available for any consultation.  I have reviewed the CMA's note and chart, and I agree with the management and plan.  Donevin Sainsbury [place] Kamree Wiens, MD, MPH, ABFM Attending Physician Faculty [facility]  

## 2020-09-04 ENCOUNTER — Other Ambulatory Visit: Payer: Self-pay

## 2020-09-04 ENCOUNTER — Encounter: Payer: Self-pay | Admitting: Advanced Practice Midwife

## 2020-09-04 ENCOUNTER — Other Ambulatory Visit (HOSPITAL_COMMUNITY)
Admission: RE | Admit: 2020-09-04 | Discharge: 2020-09-04 | Disposition: A | Discharge status: Home / Self Care | Payer: Medicaid Other | Source: Ambulatory Visit | Attending: Advanced Practice Midwife | Admitting: Advanced Practice Midwife

## 2020-09-04 ENCOUNTER — Ambulatory Visit (INDEPENDENT_AMBULATORY_CARE_PROVIDER_SITE_OTHER): Payer: Medicaid Other | Admitting: Advanced Practice Midwife

## 2020-09-04 VITALS — BP 116/85 | HR 85 | Wt 175.2 lb

## 2020-09-04 DIAGNOSIS — Z113 Encounter for screening for infections with a predominantly sexual mode of transmission: Secondary | ICD-10-CM

## 2020-09-04 DIAGNOSIS — Z124 Encounter for screening for malignant neoplasm of cervix: Secondary | ICD-10-CM | POA: Insufficient documentation

## 2020-09-04 DIAGNOSIS — Z3042 Encounter for surveillance of injectable contraceptive: Secondary | ICD-10-CM

## 2020-09-04 DIAGNOSIS — Z01419 Encounter for gynecological examination (general) (routine) without abnormal findings: Secondary | ICD-10-CM | POA: Diagnosis not present

## 2020-09-04 NOTE — Patient Instructions (Signed)
Preventive Care 21-29 Years Old, Female Preventive care refers to lifestyle choices and visits with your health care provider that can promote health and wellness. This includes: A yearly physical exam. This is also called an annual wellness visit. Regular dental and eye exams. Immunizations. Screening for certain conditions. Healthy lifestyle choices, such as: Eating a healthy diet. Getting regular exercise. Not using drugs or products that contain nicotine and tobacco. Limiting alcohol use. What can I expect for my preventive care visit? Physical exam Your health care provider may check your: Height and weight. These may be used to calculate your BMI (body mass index). BMI is a measurement that tells if you are at a healthy weight. Heart rate and blood pressure. Body temperature. Skin for abnormal spots. Counseling Your health care provider may ask you questions about your: Past medical problems. Family's medical history. Alcohol, tobacco, and drug use. Emotional well-being. Home life and relationship well-being. Sexual activity. Diet, exercise, and sleep habits. Work and work environment. Access to firearms. Method of birth control. Menstrual cycle. Pregnancy history. What immunizations do I need?  Vaccines are usually given at various ages, according to a schedule. Your health care provider will recommend vaccines for you based on your age, medicalhistory, and lifestyle or other factors, such as travel or where you work. What tests do I need?  Blood tests Lipid and cholesterol levels. These may be checked every 5 years starting at age 20. Hepatitis C test. Hepatitis B test. Screening Diabetes screening. This is done by checking your blood sugar (glucose) after you have not eaten for a while (fasting). STD (sexually transmitted disease) testing, if you are at risk. BRCA-related cancer screening. This may be done if you have a family history of breast, ovarian, tubal, or  peritoneal cancers. Pelvic exam and Pap test. This may be done every 3 years starting at age 21. Starting at age 30, this may be done every 5 years if you have a Pap test in combination with an HPV test. Talk with your health care provider about your test results, treatment options,and if necessary, the need for more tests. Follow these instructions at home: Eating and drinking  Eat a healthy diet that includes fresh fruits and vegetables, whole grains, lean protein, and low-fat dairy products. Take vitamin and mineral supplements as recommended by your health care provider. Do not drink alcohol if: Your health care provider tells you not to drink. You are pregnant, may be pregnant, or are planning to become pregnant. If you drink alcohol: Limit how much you have to 0-1 drink a day. Be aware of how much alcohol is in your drink. In the [place] one drink equals one 12 oz bottle of beer (355 mL), one 5 oz glass of wine (148 mL), or one 1 oz glass of hard liquor (44 mL).  Lifestyle Take daily care of your teeth and gums. Brush your teeth every morning and night with fluoride toothpaste. Floss one time each day. Stay active. Exercise for at least 30 minutes 5 or more days each week. Do not use any products that contain nicotine or tobacco, such as cigarettes, e-cigarettes, and chewing tobacco. If you need help quitting, ask your health care provider. Do not use drugs. If you are sexually active, practice safe sex. Use a condom or other form of protection to prevent STIs (sexually transmitted infections). If you do not wish to become pregnant, use a form of birth control. If you plan to become pregnant, see your health care   provider for a prepregnancy visit. Find healthy ways to cope with stress, such as: Meditation, yoga, or listening to music. Journaling. Talking to a trusted person. Spending time with friends and family. Safety Always wear your seat belt while driving or riding in a  vehicle. Do not drive: If you have been drinking alcohol. Do not ride with someone who has been drinking. When you are tired or distracted. While texting. Wear a helmet and other protective equipment during sports activities. If you have firearms in your house, make sure you follow all gun safety procedures. Seek help if you have been physically or sexually abused. What's next? Go to your health care provider once a year for an annual wellness visit. Ask your health care provider how often you should have your eyes and teeth checked. Stay up to date on all vaccines. This information is not intended to replace advice given to you by your health care provider. Make sure you discuss any questions you have with your healthcare provider. Document Revised: 11/07/2019 Document Reviewed: 11/20/2017 Elsevier Patient Education  2022 [employer].

## 2020-09-04 NOTE — Progress Notes (Signed)
Subjective:     Katelyn Bauer is a 30 y.o. female here at [facility] for a routine exam.  Current complaints: None.  Personal health questionnaire reviewed: yes.  Do you have a primary care provider? 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 Do you feel safe at home? yes    Health Maintenance Due  Topic Date Due   COVID-19 Vaccine (1) Never done   Hepatitis C Screening  Never done     Risk factors for chronic health problems: Smoking: Former, quit with pregnancy in 2017 Alchohol/how much: Very little, last drink 6 months ago Pt BMI: Body mass index is 29.15 kg/m.   Gynecologic History No LMP recorded. Patient has had an injection. Contraception: Depo-Provera injections Last Pap: 2021. Results were: normal.  Last mammogram: n/a. Results were: n/a  Obstetric History OB History  Gravida Para Term Preterm AB Living  2 2 2  0 0 2  SAB IAB Ectopic Multiple Live Births  0 0 0 0 2    # Outcome Date GA Lbr Len/2nd Weight Sex Delivery Anes PTL Lv  2 Term 03/20/16 [redacted]w[redacted]d 12:02 / 06:23 7 lb 5.1 oz (3.32 kg) F Vag-Spont EPI  LIV  1 Term 10/17/13   6 lb 11.2 oz (3.039 kg) F Vag-Spont EPI N LIV    Obstetric Comments  Delivered in 10/19/13.  History of Twins runs in the family. Patient is a twin And her father is a twin.     The following portions of the patient's history were reviewed and updated as appropriate: allergies, current medications, past family history, past medical history, past social history, past surgical history, and problem list.  Review of Systems Pertinent items noted in HPI and remainder of comprehensive ROS otherwise negative.    Objective:   BP 116/85 (BP Location: Left Arm)   Pulse 85   Wt 175 lb 3.2 oz (79.5 kg)   BMI 29.15 kg/m  VS reviewed, nursing note reviewed,  Constitutional: well developed, well nourished, no distress HEENT: normocephalic CV: normal rate Pulm/chest wall: normal effort Breast Exam:  Deferred with low risks and  shared decision making, discussed recommendation to start mammogram between 40-50 yo/ exam  Abdomen: soft Neuro: alert and oriented x 3 Skin: warm, dry Psych: affect normal Pelvic exam: Performed: Cervix pink, visually closed, without lesion, scant white creamy discharge, vaginal walls and external genitalia normal Bimanual exam: Cervix 0/long/high, firm, anterior, neg CMT, uterus nontender, nonenlarged, adnexa without tenderness, enlargement, or mass       Assessment/Plan:   1. Screen for STD (sexually transmitted disease) --Pt is not sexually active x 5 years --Recent testing for the [place] was negative and will be tested again before boot camp in 1 month  2. Screening for cervical cancer --Initially pt reported last pap in 2017 but Pap was collected in 2020, was unsatisfactory for evaluation and was recollected and normal in 2021.  Pap was collected and sent as part of today's evaluation. --Education done about Pap guidelines and with normal results, next recommended Pap is in 3 years. - Cytology - PAP( [place])  3. Well woman exam with routine gynecological exam --No gyn concerns or problems     Follow up in: 1  year  or as needed.   2022, CNM 8:58 AM

## 2020-09-04 NOTE — Progress Notes (Signed)
Pt presents for annual, pap, and family planning STD check. Depo scheduled 10/20/20

## 2020-09-05 LAB — CYTOLOGY - PAP: Diagnosis: NEGATIVE

## 2020-10-16 ENCOUNTER — Other Ambulatory Visit: Payer: Self-pay | Admitting: Obstetrics

## 2020-10-16 ENCOUNTER — Other Ambulatory Visit: Payer: Self-pay

## 2020-10-16 DIAGNOSIS — Z3042 Encounter for surveillance of injectable contraceptive: Secondary | ICD-10-CM

## 2020-10-16 MED ORDER — MEDROXYPROGESTERONE ACETATE 150 MG/ML IM SUSP: 150.0000 mg | INTRAMUSCULAR | 3 refills | Status: DC

## 2020-10-20 ENCOUNTER — Ambulatory Visit: Payer: Medicaid Other

## 2020-10-24 ENCOUNTER — Other Ambulatory Visit: Payer: Self-pay

## 2020-10-24 ENCOUNTER — Ambulatory Visit (INDEPENDENT_AMBULATORY_CARE_PROVIDER_SITE_OTHER): Payer: Medicaid Other | Admitting: *Deleted

## 2020-10-24 DIAGNOSIS — Z3042 Encounter for surveillance of injectable contraceptive: Secondary | ICD-10-CM | POA: Diagnosis not present

## 2020-10-24 MED ORDER — MEDROXYPROGESTERONE ACETATE 150 MG/ML IM SUSP
150.0000 mg | Freq: Once | INTRAMUSCULAR | Status: AC
  Administered 2020-10-24: 150 mg via INTRAMUSCULAR

## 2020-10-24 NOTE — Progress Notes (Signed)
Date last pap: 09/04/20. Last Depo-Provera: 08/04/20. Side Effects if any: NA. Serum HCG indicated? NA. Depo-Provera 150 mg IM given by: Montez Morita, RNC. Next appointment due 01/09/21-01/23/21.   Enlisted in [place], given dates to schedule with [employer].

## 2021-01-19 ENCOUNTER — Ambulatory Visit (HOSPITAL_BASED_OUTPATIENT_CLINIC_OR_DEPARTMENT_OTHER)
Admission: RE | Admit: 2021-01-19 | Discharge: 2021-01-19 | Disposition: A | Discharge status: Home / Self Care | Payer: Medicaid Other | Source: Ambulatory Visit | Attending: Orthopaedic Surgery | Admitting: Orthopaedic Surgery

## 2021-01-19 ENCOUNTER — Other Ambulatory Visit: Payer: Self-pay

## 2021-01-19 ENCOUNTER — Other Ambulatory Visit (HOSPITAL_BASED_OUTPATIENT_CLINIC_OR_DEPARTMENT_OTHER): Payer: Self-pay | Admitting: Orthopaedic Surgery

## 2021-01-19 ENCOUNTER — Ambulatory Visit (INDEPENDENT_AMBULATORY_CARE_PROVIDER_SITE_OTHER): Payer: Medicaid Other | Admitting: Orthopaedic Surgery

## 2021-01-19 DIAGNOSIS — T8484XA Pain due to internal orthopedic prosthetic devices, implants and grafts, initial encounter: Secondary | ICD-10-CM

## 2021-01-19 DIAGNOSIS — M25562 Pain in left knee: Secondary | ICD-10-CM | POA: Insufficient documentation

## 2021-01-19 DIAGNOSIS — M25561 Pain in right knee: Secondary | ICD-10-CM | POA: Diagnosis present

## 2021-01-19 DIAGNOSIS — M222X2 Patellofemoral disorders, left knee: Secondary | ICD-10-CM | POA: Diagnosis not present

## 2021-01-19 DIAGNOSIS — M222X1 Patellofemoral disorders, right knee: Secondary | ICD-10-CM | POA: Diagnosis not present

## 2021-01-19 DIAGNOSIS — Z96653 Presence of artificial knee joint, bilateral: Secondary | ICD-10-CM

## 2021-01-19 IMAGING — DX DG KNEE COMPLETE 4+V*L*
4 series · 4 of 4 positions shown · non-contrast
Comparison: None.

CLINICAL DATA: Chronic bilateral knee pain.

EXAM:
LEFT KNEE - COMPLETE 4+ VIEW

[knee ap]
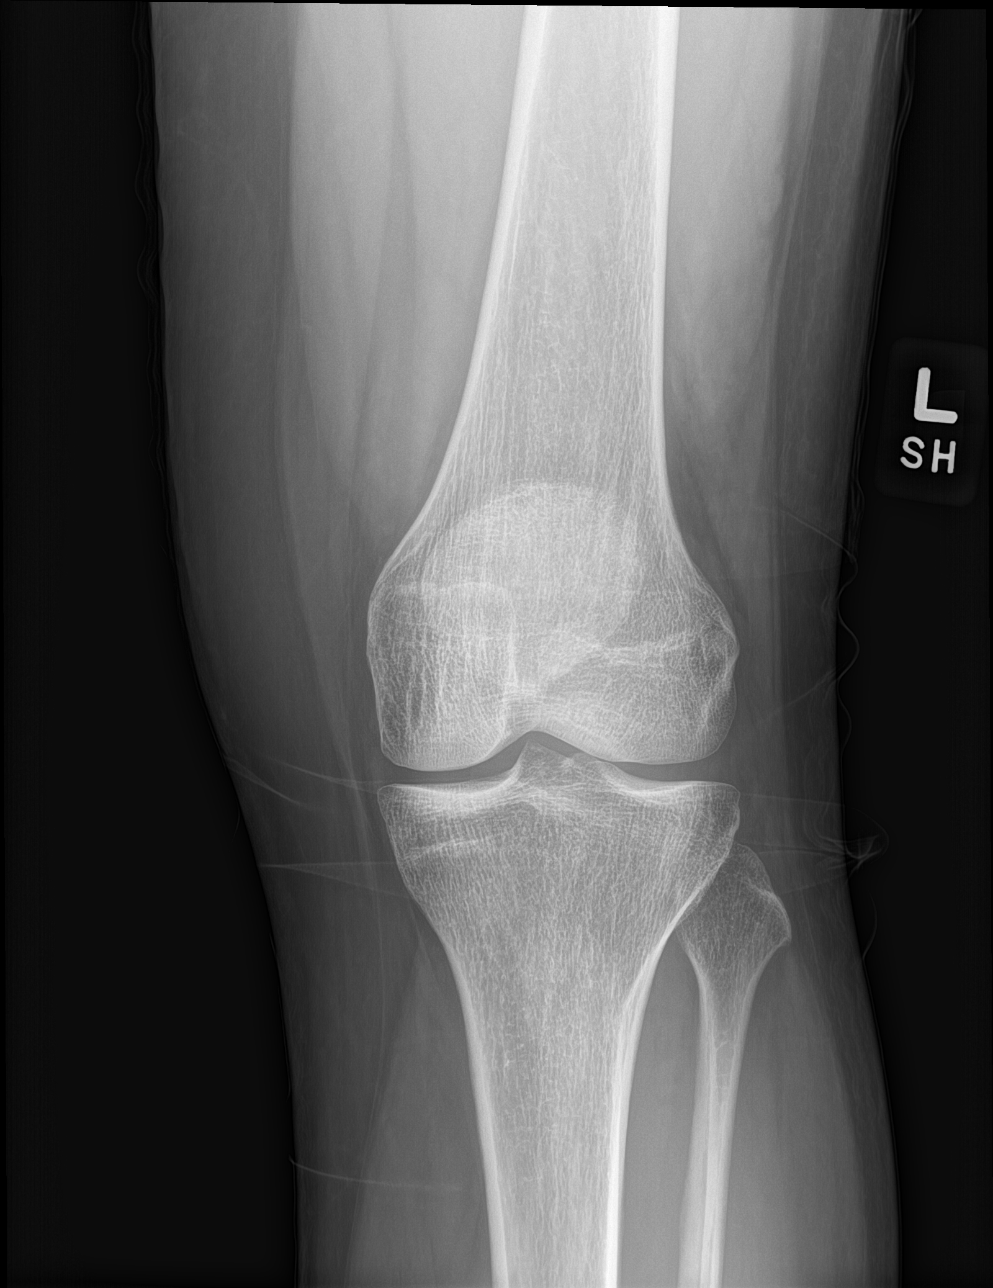

[knee lat]
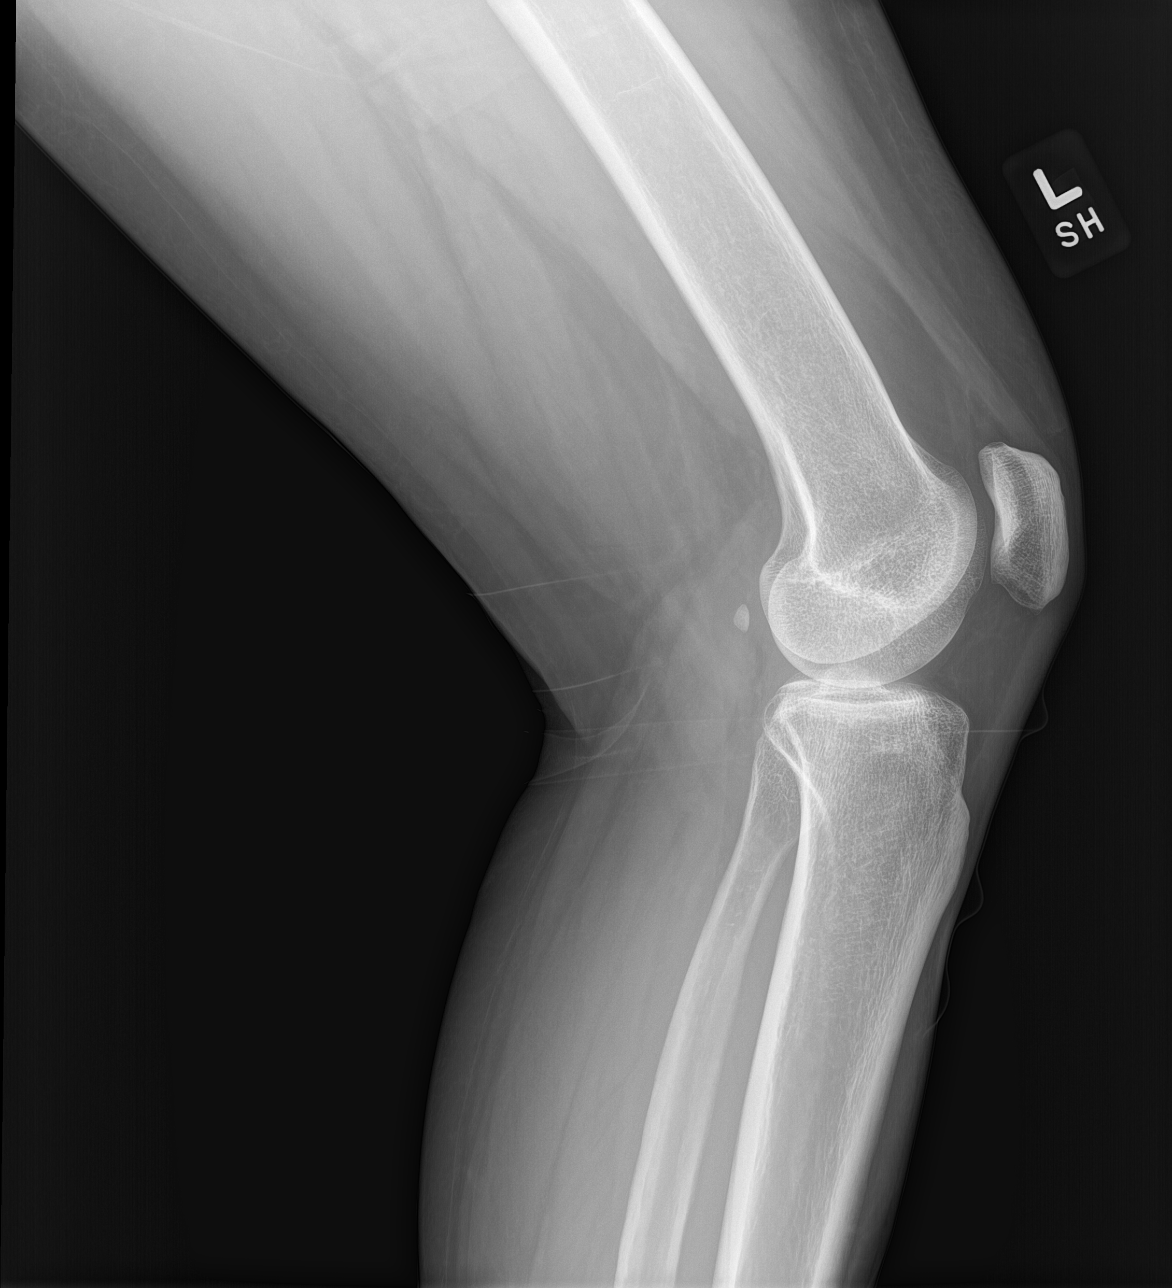

[patella skyline]
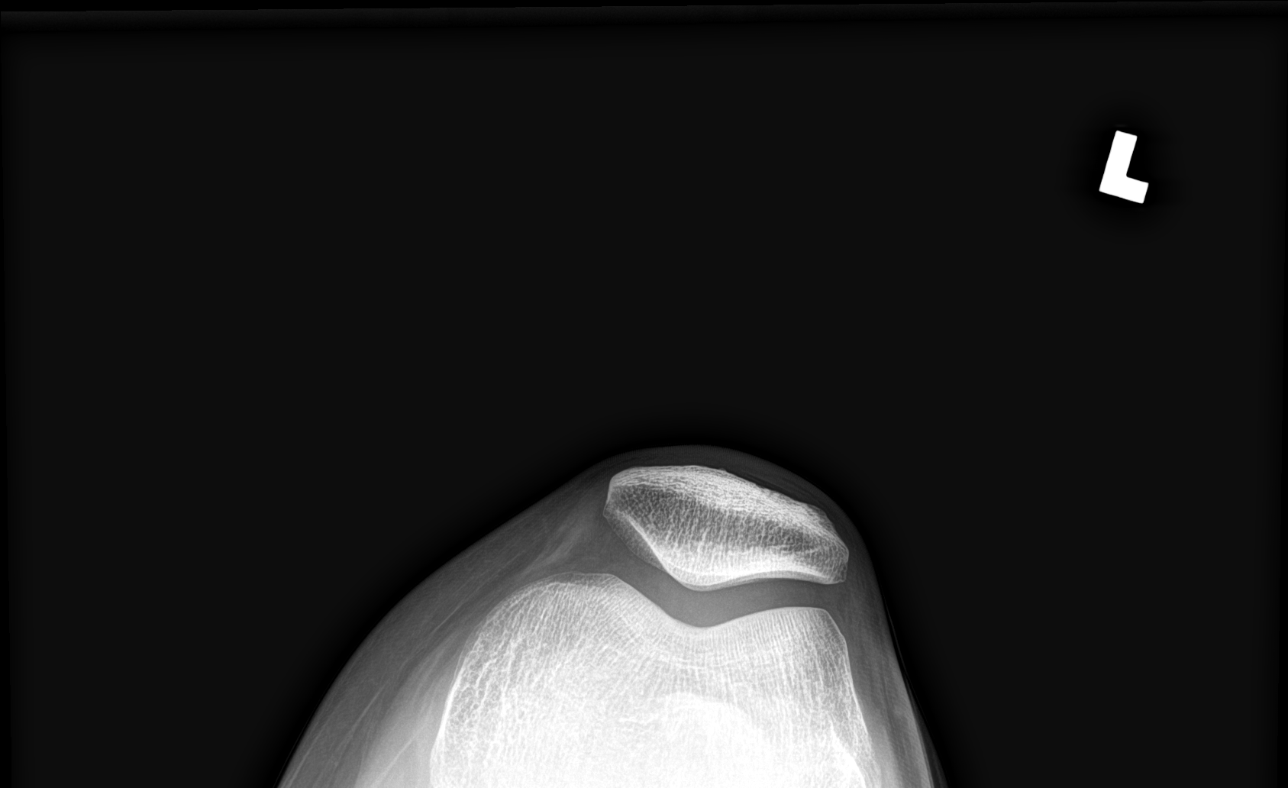

[knee tunnel]
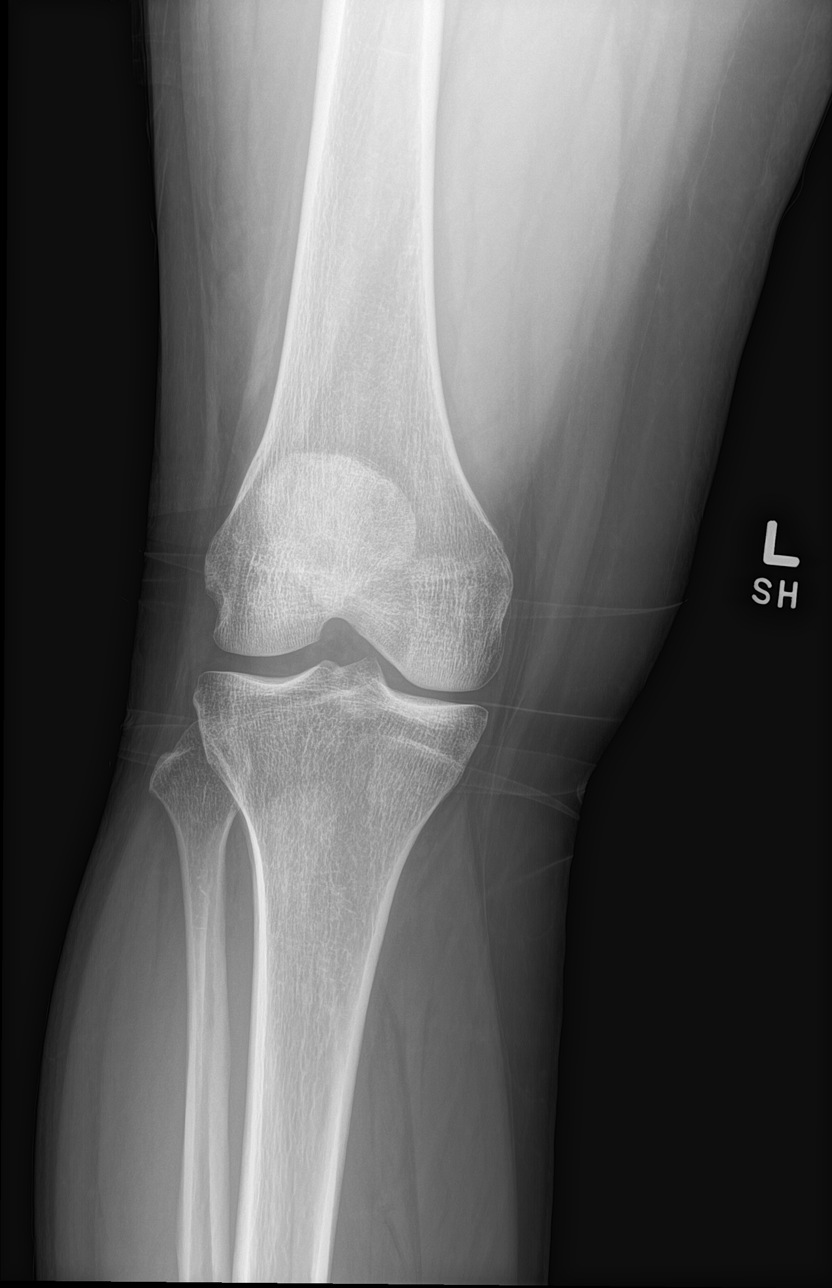

[4 of 4 positions shown; findings below may reference images not displayed]

FINDINGS: No evidence of fracture, dislocation, or joint effusion. No evidence
of arthropathy or other focal bone abnormality. Soft tissues are
unremarkable.
IMPRESSION: Negative.

## 2021-01-19 NOTE — Progress Notes (Signed)
Chief Complaint: bilateral knee pain     History of Present Illness:    Katelyn Bauer is a 30 y.o. female with bilateral knee pain going on since August of this year.  She has not had any specific injury or trauma.  She states that she feels pressure in both knees and this is worse with stairs.  She has been in [employer] and was discharged from this as she was told that she had low bone density.  She was also given crutches for partial weightbearing which have not helped.  She prefers to be active and enjoys walking.  She has been taking ibuprofen and Tylenol which did not help so she stopped taking   Surgical History:   None  PMH/PSH/Family History/Social History/Meds/Allergies:    Past Medical History:  Diagnosis Date   Medical history non-contributory    Past Surgical History:  Procedure Laterality Date   NO PAST SURGERIES     Social History   Socioeconomic History   Marital status: Single    Spouse name: Not on file   Number of children: Not on file   Years of education: Not on file   Highest education level: Not on file  Occupational History   Not on file  Tobacco Use   Smoking status: Former   Smokeless tobacco: Never  Substance and Sexual Activity   Alcohol use: No    Alcohol/week: 0.0 standard drinks    Comment: Occasional   Drug use: No   Sexual activity: Not Currently    Partners: Male    Birth control/protection: Injection, Abstinence  Other Topics Concern   Not on file  Social History Narrative   Not on file   Social Determinants of Health   Financial Resource Strain: Not on file  Food Insecurity: Not on file  Transportation Needs: Not on file  Physical Activity: Not on file  Stress: Not on file  Social Connections: Not on file   Family History  Problem Relation Age of Onset   Lung cancer Paternal Grandmother        2nd hand smoking    Brain cancer Paternal Grandmother    Allergies  Allergen Reactions    Penicillins Swelling and Other (See Comments)    Reaction:  All over body swelling  Has patient had a PCN reaction causing immediate rash, facial/tongue/throat swelling, SOB or lightheadedness with hypotension: Yes Has patient had a PCN reaction causing severe rash involving mucus membranes or skin necrosis: No Has patient had a PCN reaction that required hospitalization No Has patient had a PCN reaction occurring within the last 10 years: No If all of the above answers are "NO", then may proceed with Cephalosporin use.     Current Outpatient Medications  Medication Sig Dispense Refill   medroxyPROGESTERone (DEPO-PROVERA) 150 MG/ML injection Inject 1 mL (150 mg total) into the muscle every 3 (three) months. 1 mL 3   medroxyPROGESTERone (DEPO-PROVERA) 150 MG/ML injection Inject 1 mL (150 mg total) into the muscle every 3 (three) months. 1 mL 3   No current facility-administered medications for this visit.   No results found.  Review of Systems:   A ROS was performed including pertinent positives and negatives as documented in the HPI.  Physical Exam :   Constitutional: NAD and appears stated age Neurological: Alert  and oriented Psych: Appropriate affect and cooperative There were no vitals taken for this visit.   Comprehensive Musculoskeletal Exam:    She has bilateral central based knee pain.  Increased Q angle bilaterally.  No joint line tenderness.  Range of motion about bilateral knees is from -3 to 140 degrees without pain.  Negative Lachman and posterior drawer.  Weakness with standing single-leg squat  Imaging:   Xray (4 views bilateral knees): Normal   I personally reviewed and interpreted the radiographs.   Assessment:   30 year old female with bilateral patellofemoral pain likely due to increased Q angle bilaterally in addition to hip weakness.  I advised that I would like her to undergo a physical therapy program for hip core and quad strengthening bilaterally.  She  will get a good program of physical therapy and then execute this at home or at a local gym.  Plan :    -I will see her back in 2 months to see how she is doing following her rehab program     I personally saw and evaluated the patient, and participated in the management and treatment plan.  Huel Cote, MD Attending Physician, Orthopedic Surgery  This document was dictated using Dragon voice recognition software. A reasonable attempt at proof reading has been made to minimize errors.

## 2021-01-24 ENCOUNTER — Encounter: Payer: Self-pay | Admitting: Women's Health

## 2021-01-24 ENCOUNTER — Other Ambulatory Visit: Payer: Self-pay

## 2021-01-24 ENCOUNTER — Ambulatory Visit (INDEPENDENT_AMBULATORY_CARE_PROVIDER_SITE_OTHER): Payer: Medicaid Other | Admitting: Women's Health

## 2021-01-24 VITALS — BP 121/84 | HR 115 | Ht 65.0 in | Wt 185.0 lb

## 2021-01-24 DIAGNOSIS — N939 Abnormal uterine and vaginal bleeding, unspecified: Secondary | ICD-10-CM

## 2021-01-24 NOTE — Patient Instructions (Signed)
i

## 2021-01-24 NOTE — Progress Notes (Signed)
GYN presents for vaginal bleeding, she stated that she bled each time she works out, it happens only when she exercise.  She is on DEPO for [facility]

## 2021-01-24 NOTE — Progress Notes (Signed)
  History:  Ms. Katelyn Bauer is a 30 y.o. [id number] who presents to clinic today for abnormal bleeding. 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 Patient states the bleeding would start as soon as she was exercising and stop as soon as she was finished. Patient states she has been using Depo since January 2018 and has been using it ever since, without any missed injections. However, patient states she has decided to come of of the Depo, so she did not take the shot around 10/19. Patient states she was told she has bone issues, so she decided to come off of the Depo. Patient states bootcamp was August 10-October 3 and that was the only time she experienced the bleeding. Patient states she had Pap smear and a vaginal [place] while at bootcamp and was told everything was normal. Patient also states she would have "labor like contractions" at night and during bootcamp only. Patient reports no sexual intercourse in the past 5 years.  The following portions of the patient's history were reviewed and updated as appropriate: allergies, current medications, family history, past medical history, social history, past surgical history and problem list.  Review of Systems:  Review of Systems  Constitutional:  Negative for chills and fever.  Respiratory:  Negative for cough.   Gastrointestinal:  Negative for abdominal pain, nausea and vomiting.  Genitourinary:  Negative for dysuria, frequency and urgency.     Objective:  Physical Exam BP 121/84   Pulse (!) 115   Ht 5\' 5"  (1.651 m)   Wt 185 lb (83.9 kg)   BMI 30.79 kg/m   Physical Exam Vitals and nursing note reviewed.  Constitutional:      General: She is not in acute distress.    Appearance: Normal appearance. She is not ill-appearing, toxic-appearing or diaphoretic.  HENT:     Head: Normocephalic and atraumatic.  Pulmonary:     Effort: Pulmonary effort is normal.  Neurological:     Mental  Status: She is alert and oriented to person, place, and time.  Psychiatric:        Mood and Affect: Mood normal.        Behavior: Behavior normal.        Thought Content: Thought content normal.        Judgment: Judgment normal.  Pt declines pelvic exam at this time as she reports normal pelvic exam this summer during bootcamp experience.  Labs and Imaging No results found for this or any previous visit (from the past 24 hour(s)).  No results found.  Health Maintenance Due  Topic Date Due   Hepatitis C Screening  Never done   INFLUENZA VACCINE  Never done   COVID-19 Vaccine (2 - Pfizer series) 11/29/2020    Assessment & Plan:   1. Abnormal uterine bleeding (AUB) - 01/29/2021 PELVIC COMPLETE WITH TRANSVAGINAL; Future - CBC - Ferritin - Thyroid Panel With TSH - HgB A1c - Prolactin - Testosterone  Approximately 10 minutes of total time was spent with this patient on counseling and coordination of care.  [place], NP 01/24/2021 10:33 AM

## 2021-01-25 LAB — FERRITIN: Ferritin: 135 ng/mL (ref 15–150)

## 2021-01-25 LAB — THYROID PANEL WITH TSH
Free Thyroxine Index: 1.9 (ref 1.2–4.9)
T3 Uptake Ratio: 20 % — ABNORMAL LOW (ref 24–39)
T4, Total: 9.5 ug/dL (ref 4.5–12.0)
TSH: 1.51 u[IU]/mL (ref 0.450–4.500)

## 2021-01-25 LAB — CBC
Hematocrit: 41.8 % (ref 34.0–46.6)
Hemoglobin: 13.1 g/dL (ref 11.1–15.9)
MCH: 21.9 pg — ABNORMAL LOW (ref 26.6–33.0)
MCHC: 31.3 g/dL — ABNORMAL LOW (ref 31.5–35.7)
MCV: 70 fL — ABNORMAL LOW (ref 79–97)
Platelets: 314 10*3/uL (ref 150–450)
RBC: 5.97 x10E6/uL — ABNORMAL HIGH (ref 3.77–5.28)
RDW: 14.8 % (ref 11.7–15.4)
WBC: 5.6 10*3/uL (ref 3.4–10.8)

## 2021-01-25 LAB — TESTOSTERONE: Testosterone: 10 ng/dL — ABNORMAL LOW (ref 13–71)

## 2021-01-25 LAB — HEMOGLOBIN A1C
Est. average glucose Bld gHb Est-mCnc: 108 mg/dL
Hgb A1c MFr Bld: 5.4 % (ref 4.8–5.6)

## 2021-01-25 LAB — PROLACTIN: Prolactin: 6.3 ng/mL (ref 4.8–23.3)

## 2021-01-26 ENCOUNTER — Telehealth: Payer: Self-pay | Admitting: Orthopaedic Surgery

## 2021-01-26 ENCOUNTER — Telehealth: Payer: Medicaid Other | Admitting: Physician Assistant

## 2021-01-26 DIAGNOSIS — G8929 Other chronic pain: Secondary | ICD-10-CM

## 2021-01-26 DIAGNOSIS — R29898 Other symptoms and signs involving the musculoskeletal system: Secondary | ICD-10-CM

## 2021-01-26 NOTE — Telephone Encounter (Signed)
Patient called. Says she is in a lot of pain, would like a call back. [phone]

## 2021-01-26 NOTE — Progress Notes (Signed)
Virtual Visit Consent   Katelyn Bauer, you are scheduled for a virtual visit with a [facility] provider today.     Just as with appointments in the office, your consent must be obtained to participate.  Your consent will be active for this visit and any virtual visit you may have with one of our providers in the next 365 days.     If you have a MyChart account, a copy of this consent can be sent to you electronically.  All virtual visits are billed to your insurance company just like a traditional visit in the office.    As this is a virtual visit, video technology does not allow for your provider to perform a traditional examination.  This may limit your provider's ability to fully assess your condition.  If your provider identifies any concerns that need to be evaluated in person or the need to arrange testing (such as labs, EKG, etc.), we will make arrangements to do so.     Although advances in technology are sophisticated, we cannot ensure that it will always work on either your end or our end.  If the connection with a video visit is poor, the visit may have to be switched to a telephone visit.  With either a video or telephone visit, we are not always able to ensure that we have a secure connection.     I need to obtain your verbal consent now.   Are you willing to proceed with your visit today?    Katelyn Bauer has provided verbal consent on 01/26/2021 for a virtual visit (video or telephone).   Piedad Climes, [place]   Date: 01/26/2021 1:21 PM   Virtual Visit via Video Note   I, Piedad Climes, connected with  Katelyn Bauer  ([id number], November 30, 1990) on 01/26/21 at  1:15 PM EDT by a video-enabled telemedicine application and verified that I am speaking with the correct person using two identifiers.  Location: Patient: Virtual Visit Location Patient: Home Provider: Virtual Visit Location Provider: Home Office   I discussed the limitations of evaluation and management by  telemedicine and the availability of in person appointments. The patient expressed understanding and agreed to proceed.    History of Present Illness: Katelyn Bauer is a 30 y.o. who identifies as a female who was assigned female at birth, and is being seen today for ongoing msk issues. Has recently been diagnosed with osteopenia with ongoing knee, hip and lower extremity pain,. Is followed by Orthopedist with imaging this past week of her knees. Notes she has not had other imaging of her legs and is concerned about this as she is noting occasional trembling sensation in lower legs like they want to give out from under her. Notes this is gradually worsening and especially so since her visit with specialist last week. Denies any true buckling or falls. Denies altered sensation. Wants further assessment prior to her PT starting in a couple of weeks.   HPI: HPI  Problems:  Patient Active Problem List   Diagnosis Date Noted   Vitamin D deficiency 04/16/2017   Elevated hemoglobin A1c 04/16/2017   GDM (gestational diabetes mellitus), class A1 02/19/2016    Allergies:  Allergies  Allergen Reactions   Penicillins Swelling and Other (See Comments)    Reaction:  All over body swelling  Has patient had a PCN reaction causing immediate rash, facial/tongue/throat swelling, SOB or lightheadedness with hypotension: Yes Has patient had a PCN reaction causing severe rash involving mucus  membranes or skin necrosis: No Has patient had a PCN reaction that required hospitalization No Has patient had a PCN reaction occurring within the last 10 years: No If all of the above answers are "NO", then may proceed with Cephalosporin use.     Medications: No current outpatient medications on file.  Observations/Objective: Patient is well-developed, well-nourished in no acute distress.  Resting comfortably at home.  Head is normocephalic, atraumatic.  No labored breathing. Speech is clear and coherent with logical  content.  Patient is alert and oriented at baseline.   Assessment and Plan: 1. Bilateral leg weakness  2. Chronic pain of both hips Followed by Orthopedics with current workup/management plan in place. No alarm symptoms but noting gradual weakness in proximal lower extremities. Instructed her to call her Orthopedist now to discuss so she can get further guidance. If they are unable to get her in today, she is to go to one of the local [facility]o [facility]. Resources given.   No charge for visit.   Follow Up Instructions: I discussed the assessment and treatment plan with the patient. The patient was provided an opportunity to ask questions and all were answered. The patient agreed with the plan and demonstrated an understanding of the instructions.  A copy of instructions were sent to the patient via MyChart unless otherwise noted below.   The patient was advised to call back or seek an in-person evaluation if the symptoms worsen or if the condition fails to improve as anticipated.  Time:  I spent 10 minutes with the patient via telehealth technology discussing the above problems/concerns.    Piedad Climes, PA-C

## 2021-01-27 ENCOUNTER — Observation Stay (HOSPITAL_COMMUNITY)
Admission: EM | Admit: 2021-01-27 | Discharge: 2021-01-28 | Disposition: A | Discharge status: Home / Self Care | Payer: Medicaid Other | Attending: Internal Medicine | Admitting: Internal Medicine

## 2021-01-27 ENCOUNTER — Emergency Department (HOSPITAL_COMMUNITY): Payer: Medicaid Other

## 2021-01-27 ENCOUNTER — Encounter (HOSPITAL_COMMUNITY): Payer: Self-pay | Admitting: Emergency Medicine

## 2021-01-27 ENCOUNTER — Other Ambulatory Visit: Payer: Self-pay

## 2021-01-27 DIAGNOSIS — M79605 Pain in left leg: Principal | ICD-10-CM | POA: Insufficient documentation

## 2021-01-27 DIAGNOSIS — M79604 Pain in right leg: Secondary | ICD-10-CM | POA: Diagnosis not present

## 2021-01-27 DIAGNOSIS — Z87891 Personal history of nicotine dependence: Secondary | ICD-10-CM | POA: Diagnosis not present

## 2021-01-27 DIAGNOSIS — Z20822 Contact with and (suspected) exposure to covid-19: Secondary | ICD-10-CM | POA: Insufficient documentation

## 2021-01-27 DIAGNOSIS — R52 Pain, unspecified: Secondary | ICD-10-CM

## 2021-01-27 DIAGNOSIS — R29898 Other symptoms and signs involving the musculoskeletal system: Secondary | ICD-10-CM

## 2021-01-27 HISTORY — DX: Disorder of bone density and structure, unspecified: M85.9

## 2021-01-27 LAB — CBC
HCT: 38.6 % (ref 36.0–46.0)
Hemoglobin: 12.1 g/dL (ref 12.0–15.0)
MCH: 21.9 pg — ABNORMAL LOW (ref 26.0–34.0)
MCHC: 31.3 g/dL (ref 30.0–36.0)
MCV: 69.8 fL — ABNORMAL LOW (ref 80.0–100.0)
Platelets: 261 10*3/uL (ref 150–400)
RBC: 5.53 MIL/uL — ABNORMAL HIGH (ref 3.87–5.11)
RDW: 14.8 % (ref 11.5–15.5)
WBC: 7.2 10*3/uL (ref 4.0–10.5)
nRBC: 0 % (ref 0.0–0.2)

## 2021-01-27 LAB — COMPREHENSIVE METABOLIC PANEL
ALT: 14 U/L (ref 0–44)
AST: 16 U/L (ref 15–41)
Albumin: 3.7 g/dL (ref 3.5–5.0)
Alkaline Phosphatase: 33 U/L — ABNORMAL LOW (ref 38–126)
Anion gap: 7 (ref 5–15)
BUN: 11 mg/dL (ref 6–20)
CO2: 25 mmol/L (ref 22–32)
Calcium: 9.6 mg/dL (ref 8.9–10.3)
Chloride: 106 mmol/L (ref 98–111)
Creatinine, Ser: 0.66 mg/dL (ref 0.44–1.00)
GFR, Estimated: >60 mL/min (ref >60)
Glucose, Bld: 79 mg/dL (ref 70–99)
Potassium: 4.2 mmol/L (ref 3.5–5.1)
Sodium: 138 mmol/L (ref 135–145)
Total Bilirubin: 0.6 mg/dL (ref 0.3–1.2)
Total Protein: 7.2 g/dL (ref 6.5–8.1)

## 2021-01-27 LAB — CBC WITH DIFFERENTIAL/PLATELET
Abs Immature Granulocytes: 0.01 10*3/uL (ref 0.00–0.07)
Basophils Absolute: 0.1 10*3/uL (ref 0.0–0.1)
Basophils Relative: 1 %
Eosinophils Absolute: 0.1 10*3/uL (ref 0.0–0.5)
Eosinophils Relative: 2 %
HCT: 38.2 % (ref 36.0–46.0)
Hemoglobin: 12.1 g/dL (ref 12.0–15.0)
Immature Granulocytes: 0 %
Lymphocytes Relative: 32 %
Lymphs Abs: 1.8 10*3/uL (ref 0.7–4.0)
MCH: 22.1 pg — ABNORMAL LOW (ref 26.0–34.0)
MCHC: 31.7 g/dL (ref 30.0–36.0)
MCV: 69.7 fL — ABNORMAL LOW (ref 80.0–100.0)
Monocytes Absolute: 0.5 10*3/uL (ref 0.1–1.0)
Monocytes Relative: 9 %
Neutro Abs: 3.2 10*3/uL (ref 1.7–7.7)
Neutrophils Relative %: 56 %
Platelets: 262 10*3/uL (ref 150–400)
RBC: 5.48 MIL/uL — ABNORMAL HIGH (ref 3.87–5.11)
RDW: 14.7 % (ref 11.5–15.5)
WBC: 5.7 10*3/uL (ref 4.0–10.5)
nRBC: 0 % (ref 0.0–0.2)

## 2021-01-27 LAB — VITAMIN B12
Vitamin B-12: 301 pg/mL (ref 180–914)
Vitamin B-12: 296 pg/mL (ref 180–914)

## 2021-01-27 LAB — RESP PANEL BY RT-PCR (FLU A&B, COVID) ARPGX2
Influenza A by PCR: NEGATIVE
Influenza B by PCR: NEGATIVE
SARS Coronavirus 2 by RT PCR: NEGATIVE

## 2021-01-27 LAB — I-STAT BETA HCG BLOOD, ED (MC, WL, AP ONLY): I-stat hCG, quantitative: <5 m[IU]/mL (ref <5)

## 2021-01-27 LAB — HIV ANTIBODY (ROUTINE TESTING W REFLEX): HIV Screen 4th Generation wRfx: NONREACTIVE

## 2021-01-27 LAB — CK: Total CK: 61 U/L (ref 38–234)

## 2021-01-27 LAB — CREATININE, SERUM
Creatinine, Ser: 0.74 mg/dL (ref 0.44–1.00)
GFR, Estimated: >60 mL/min (ref >60)

## 2021-01-27 IMAGING — MR MR LUMBAR SPINE W/ CM
4 series · 19 of 48 positions shown · IV contrast (gadavist)
Comparison: Noncontrast MRI from [DATE]

CLINICAL DATA: Mass along the cauda equina, for further
characterization.

EXAM:
MRI LUMBAR SPINE WITH CONTRAST
TECHNIQUE: Multiplanar and multiecho pulse sequences of the lumbar spine were
obtained with intravenous contrast.
CONTRAST:  8.3mL GADAVIST GADOBUTROL 1 MMOL/ML IV SOLN

[Series 2: T1 · sagittal · 4.0mm · 0.51mm/px · 7 of 15 slices shown (1 of 2)]
[im 1/15]
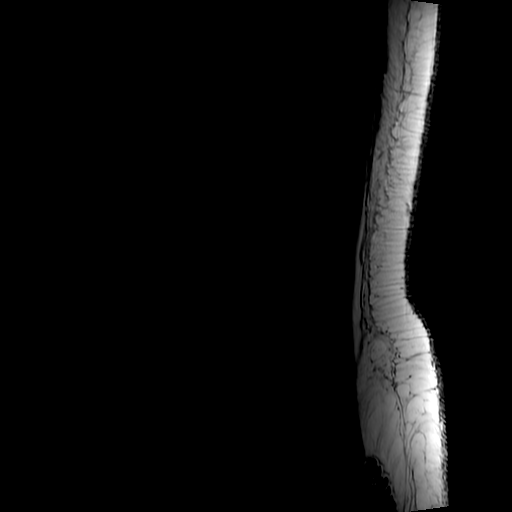
[im 3/15]
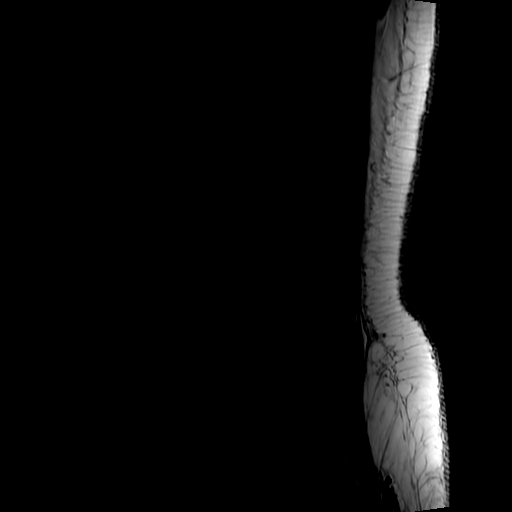
[im 5/15]
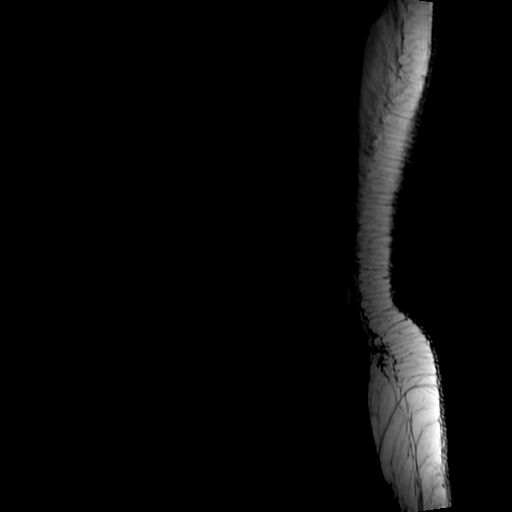
[im 8/15]
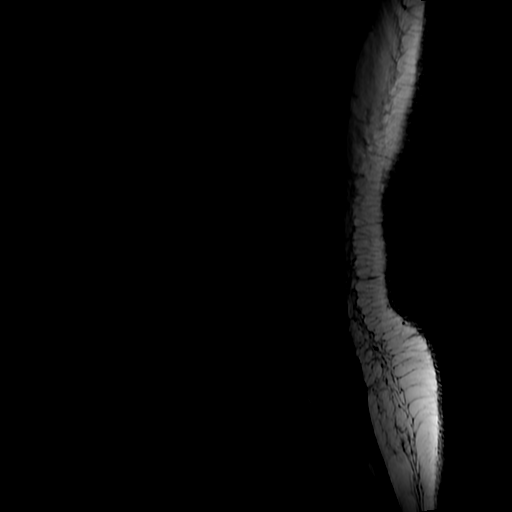
[im 10/15]
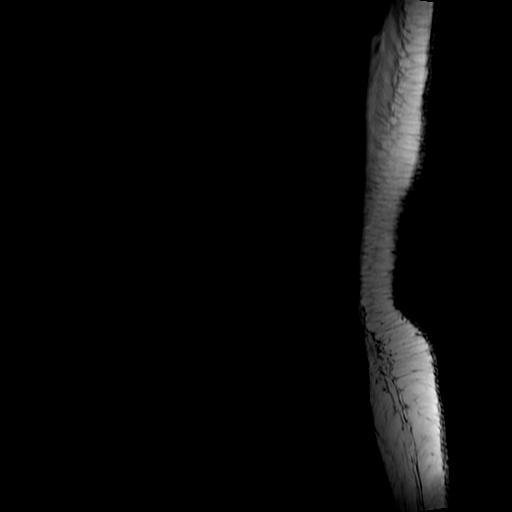
[im 12/15]
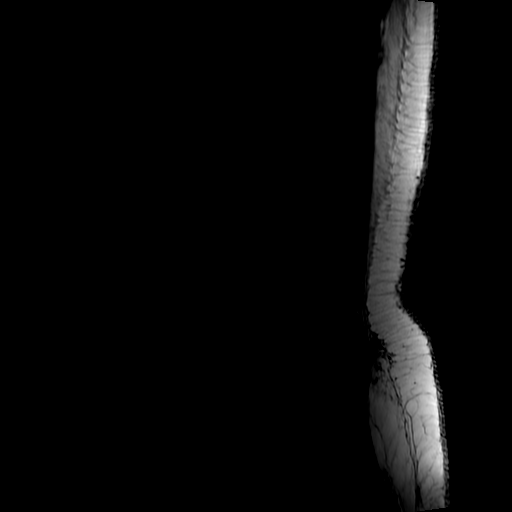
[im 15/15]
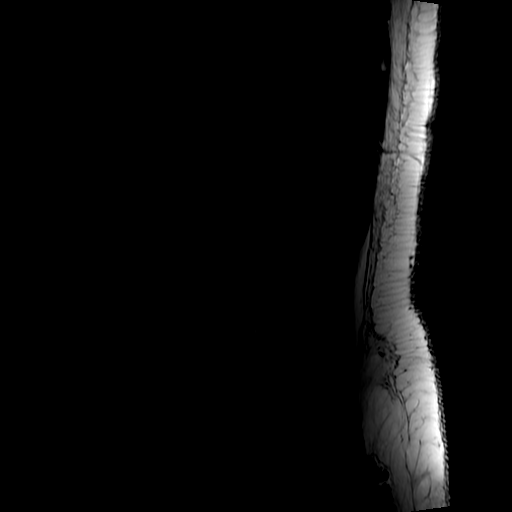

[Series 3: T1 · axial · 4.0mm · 0.39mm/px · z∈[-83,+90]mm · 6 of 40 slices shown (2 of 2)]
[im 3/40]
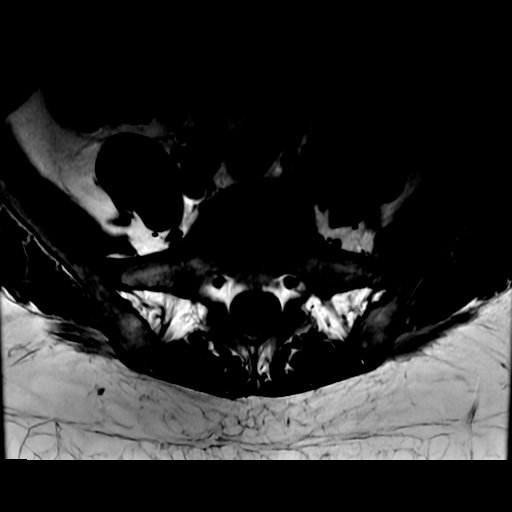
[im 5/40]
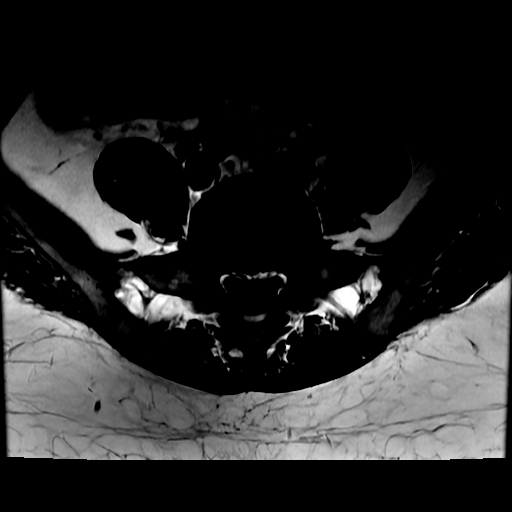
[im 8/40]
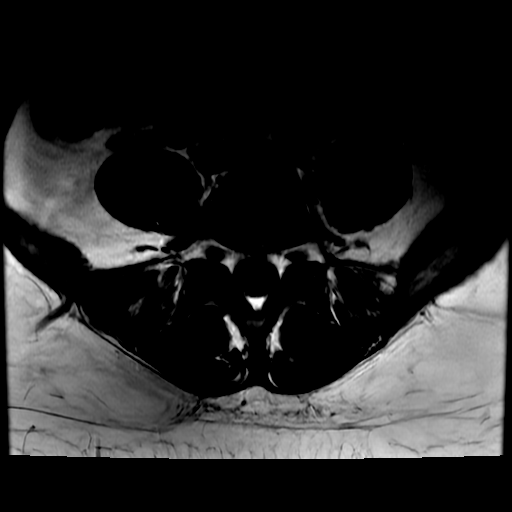
[im 13/40]
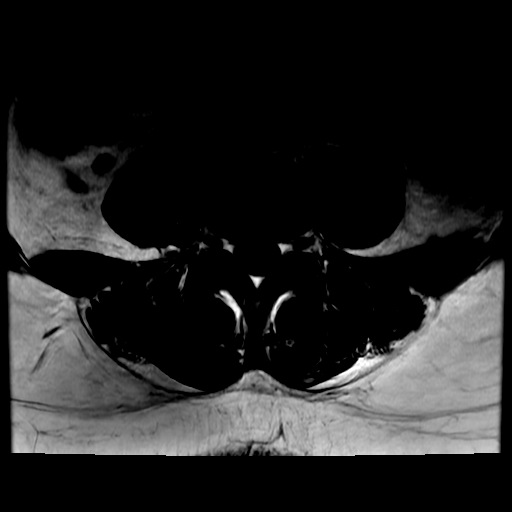
[im 20/40]
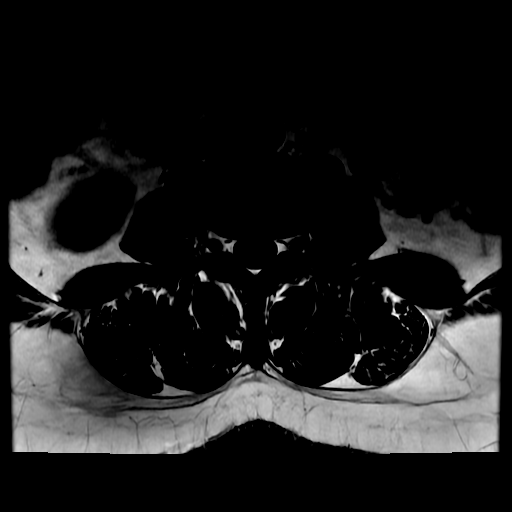
[im 35/40]
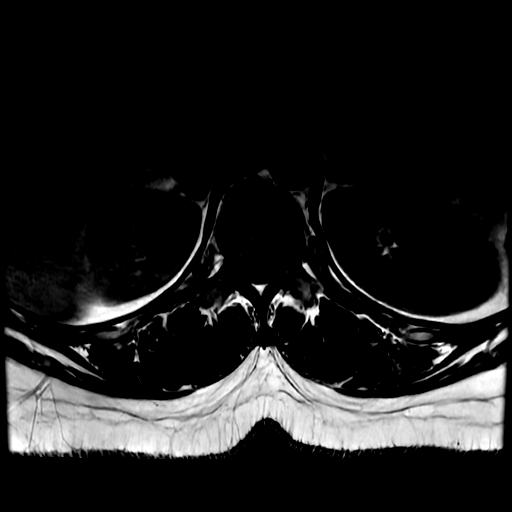

[Series 4: T1 post-contrast · sagittal · 4.0mm · 0.51mm/px · 3 of 15 slices shown (1 of 2)]
[im 3/15]
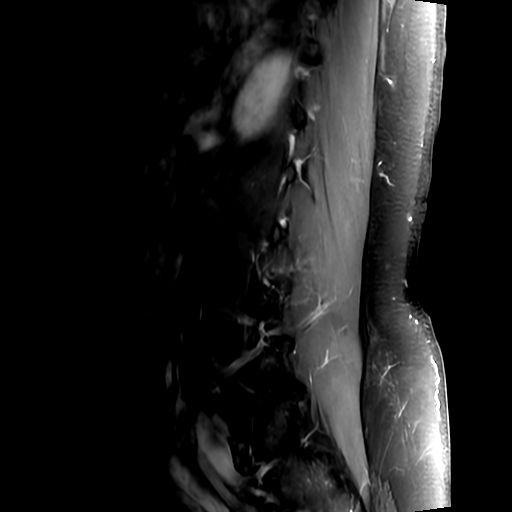
[im 8/15]
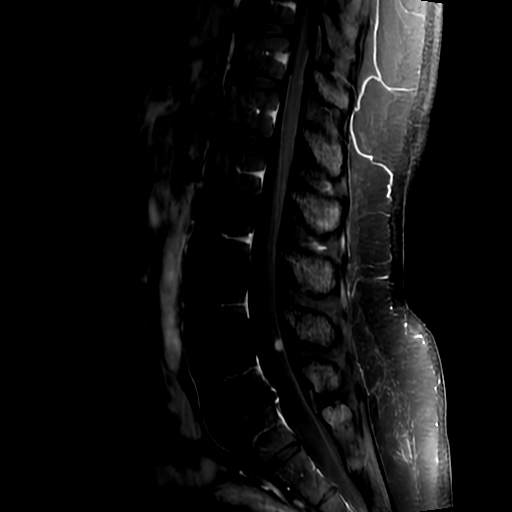
[im 12/15]
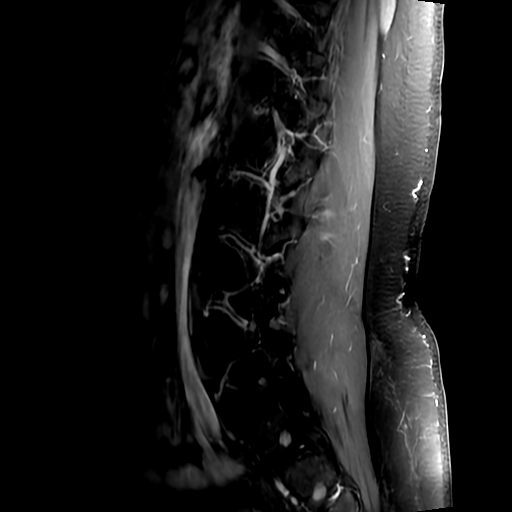

[Series 5: T1 post-contrast · axial · 4.0mm · 0.39mm/px · z∈[-73,+90]mm · 3 of 40 slices shown (2 of 2)]
[im 5/40]
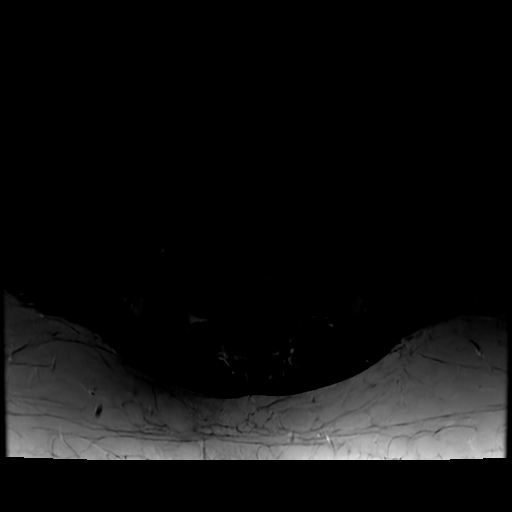
[im 20/40]
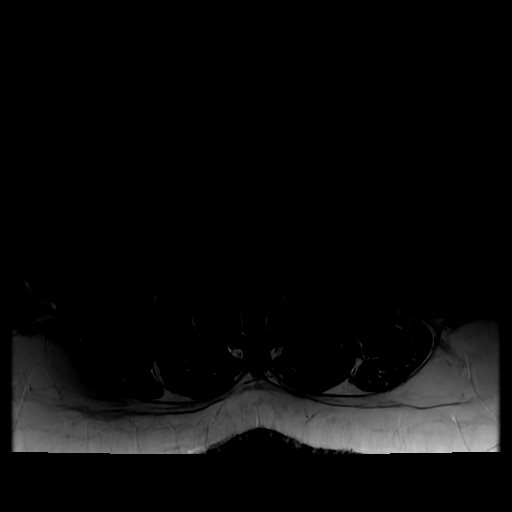
[im 35/40]
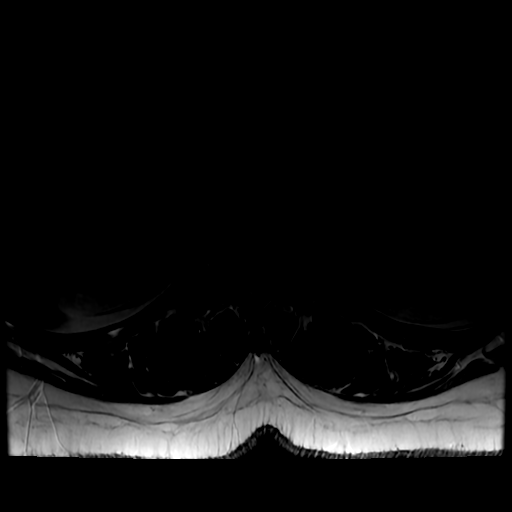

[19 of 48 positions shown; findings below may reference images not displayed]

FINDINGS: At the L4-5 level, the small posterior mass along the cauda equina
diffusely and homogeneously enhances, measuring 0.5 by 0.4 by 0.5 cm
on image 8 series 4 and image 29 series 5. Sharp and well-defined
border. No additional significant enhancing lesions are identified.
No abnormal paraspinal enhancing process.
IMPRESSION: 1. 5 by 4 by 5 mm posterior mass in the midline of the thecal sac
along the cauda equina at about the L4-5 level posteriorly has
diffuse homogeneous sharply defined enhancement. A small schwannoma
remains a top differential diagnostic consideration.

## 2021-01-27 IMAGING — MR MR LUMBAR SPINE W/O CM
4 of 5 series · 19 of 48 positions shown · non-contrast
Comparison: None.

CLINICAL DATA: Leg pain over the last 3 months.

EXAM:
MRI LUMBAR SPINE WITHOUT CONTRAST
TECHNIQUE: Multiplanar, multisequence MR imaging of the lumbar spine was
performed. No intravenous contrast was administered.

[Series 2: T2 · sagittal · 4.0mm · 0.55mm/px · 6 of 15 slices shown (1 of 2)]
[im 1/15]
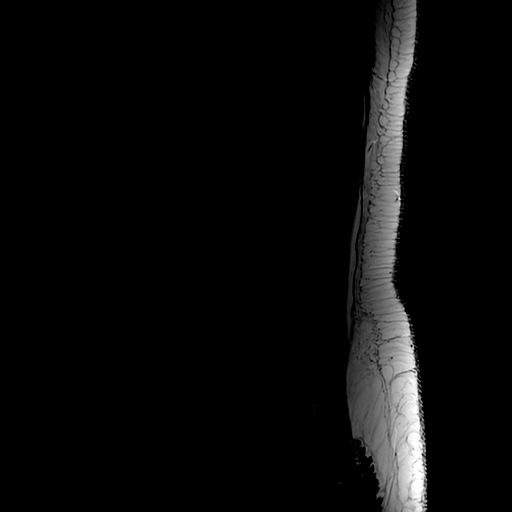
[im 3/15]
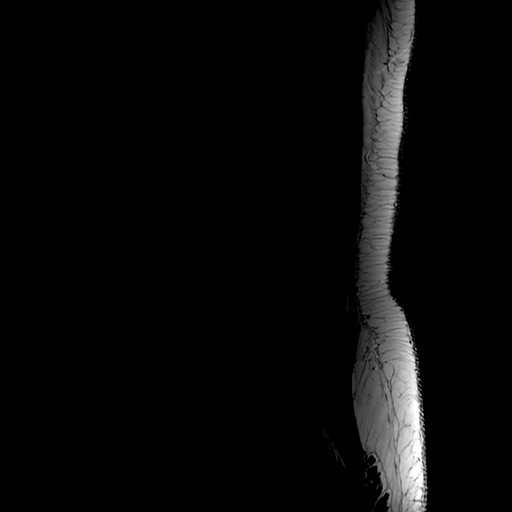
[im 6/15]
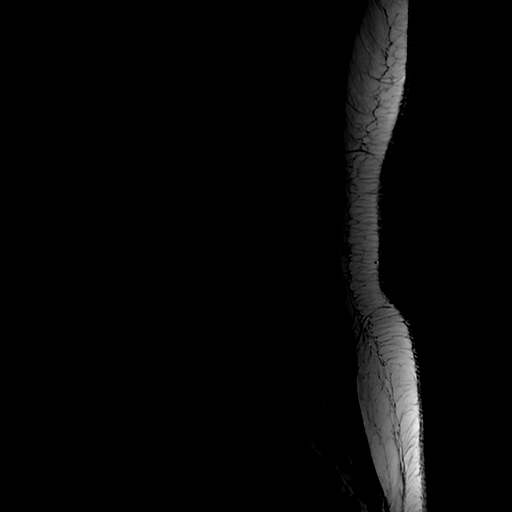
[im 9/15]
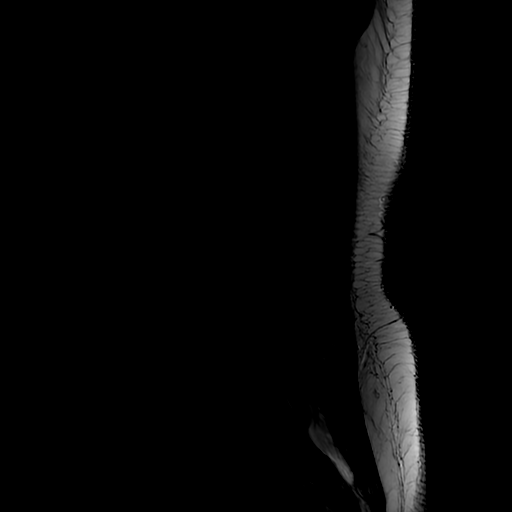
[im 12/15]
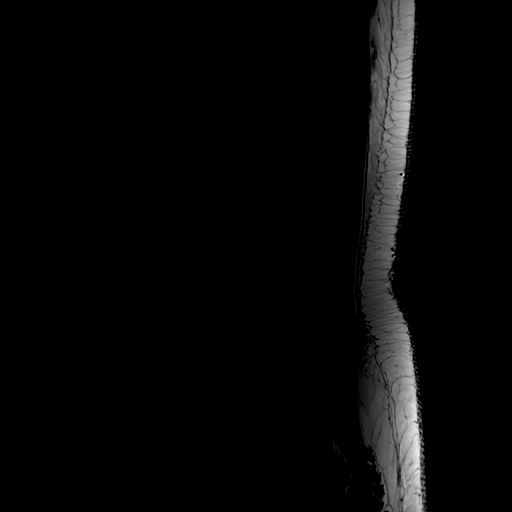
[im 15/15]
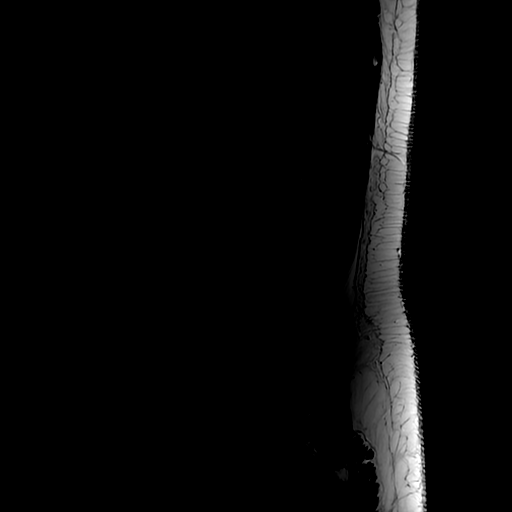

[Series 4: T1 · sagittal · 4.0mm · 0.55mm/px · 3 of 15 slices shown (1 of 2)]
[im 3/15]
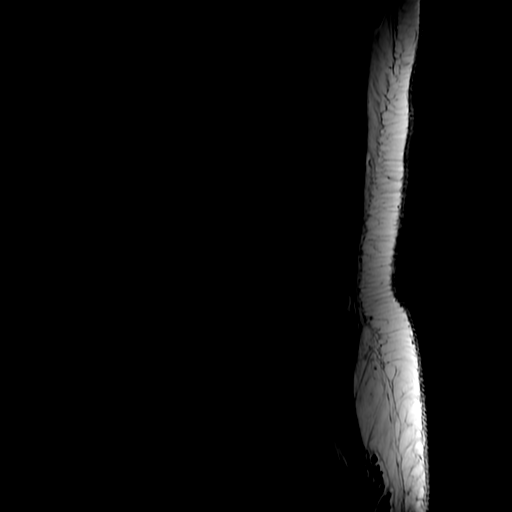
[im 9/15]
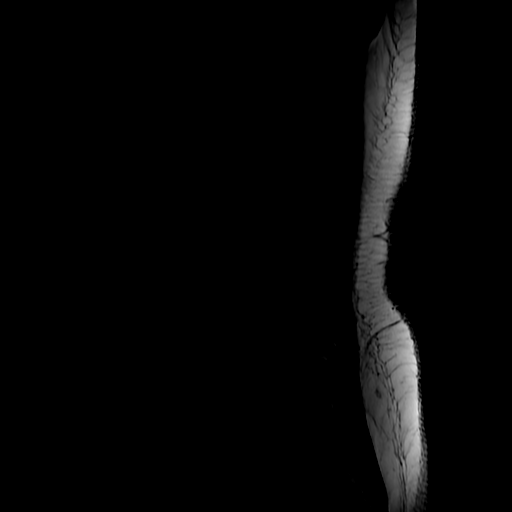
[im 15/15]
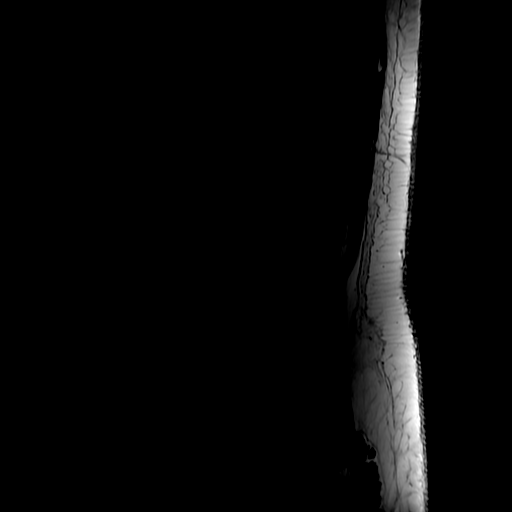

[Series 5: T2 · axial · 4.0mm · 0.39mm/px · z∈[-65,+97]mm · 7 of 39 slices shown (2 of 2)]
[im 3/39]
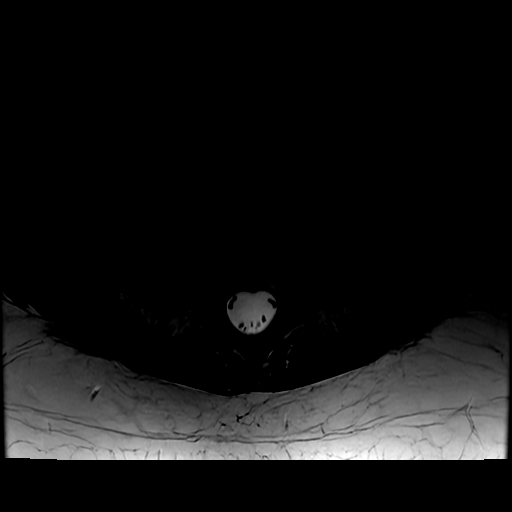
[im 6/39]
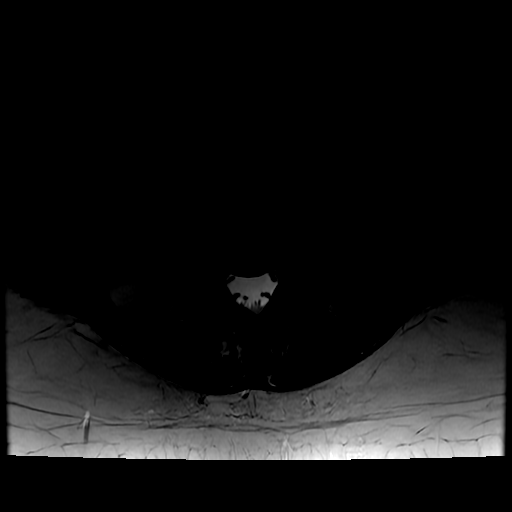
[im 8/39]
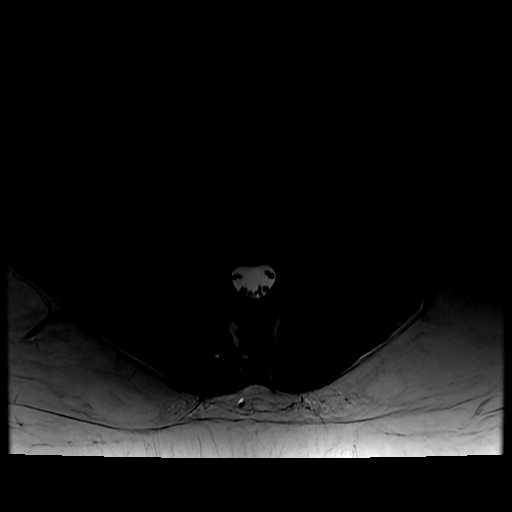
[im 13/39]
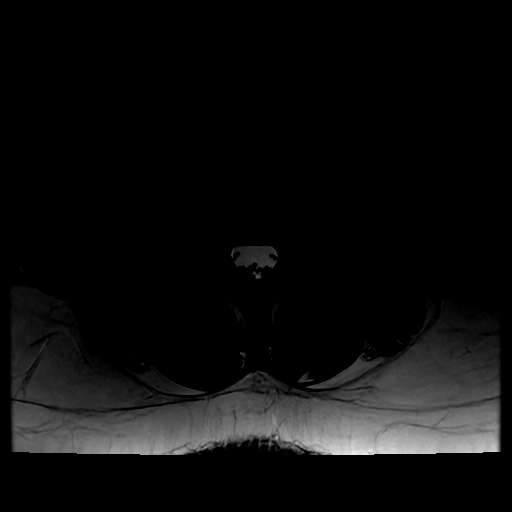
[im 18/39]
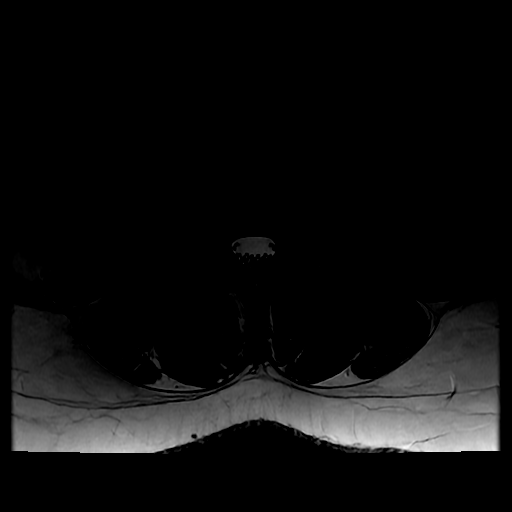
[im 21/39]
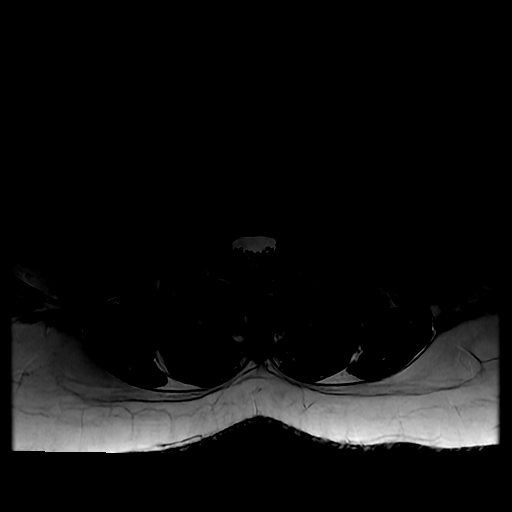
[im 33/39]
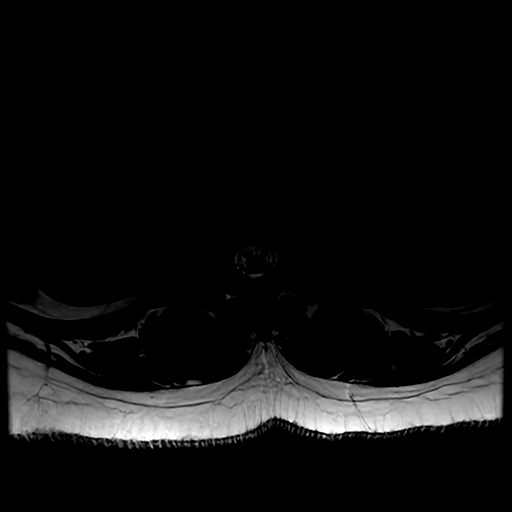

[Series 6: T1 · axial · 4.0mm · 0.39mm/px · z∈[-50,+97]mm · 3 of 36 slices shown (2 of 2)]
[im 6/36]
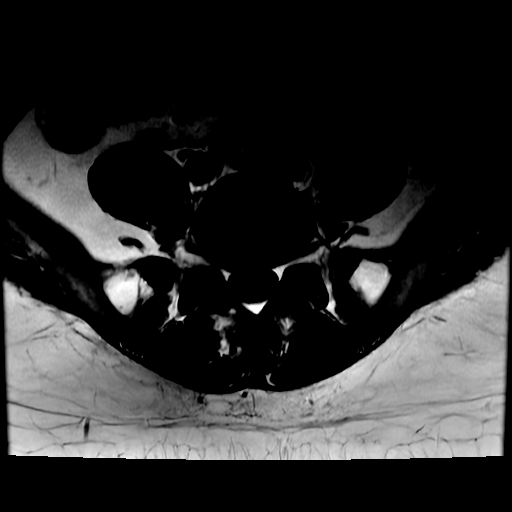
[im 19/36]
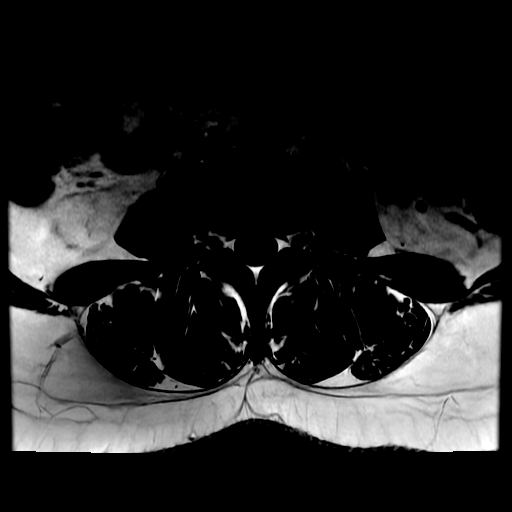
[im 30/36]
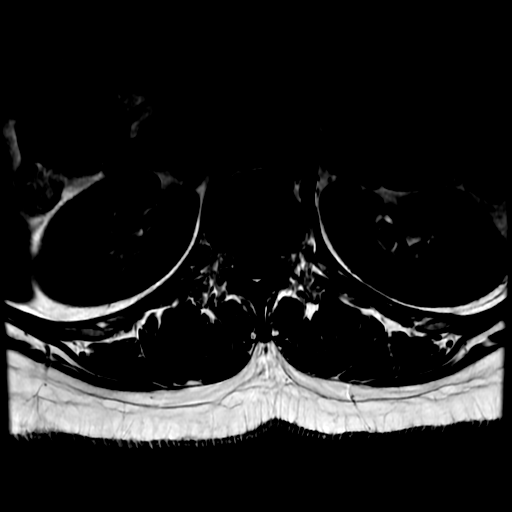

[19 of 48 positions shown; findings below may reference images not displayed]

FINDINGS: Segmentation: The lowest lumbar type non-rib-bearing vertebra is
labeled as L5.

Alignment:  No vertebral subluxation is observed.

Vertebrae:  Disc desiccation at L5-S1.

Conus medullaris and cauda equina: Conus extends to the lower L2
level, mildly low.

Approximately the L4-5 vertebral level, a 0.6 by 0.3 by 0.4 cm
intermediate signal intensity lesion is present posteriorly in the
midline along the filum terminale believed to be intradural and
intramedullary, probably a schwannoma, less likely to be a small
ependymoma.

Paraspinal and other soft tissues: Unremarkable

Disc levels:

L1-2: Unremarkable.

L2-3: Unremarkable.

L3-4: Unremarkable.

L4-5: Unremarkable.

L5-S1: No impingement. Right paracentral and lateral recess disc
protrusion.
IMPRESSION: 1. 6 by 3 by 4 mm intradural intramedullary lesion posteriorly along
the thecal sac at about the L4-5 level, probably a small schwannoma
although ependymoma can also occur along the cauda equina region, as
can other much less common lesions. Completion lumbar MRI with
contrast is recommended for baseline characterization.
2. Right paracentral and lateral recess disc protrusion at L5-S1 but
without overt impingement.

## 2021-01-27 MED ORDER — HEPARIN SODIUM (PORCINE) 5000 UNIT/ML IJ SOLN
5000.0000 [IU] | Freq: Three times a day (TID) | INTRAMUSCULAR | Status: DC
  Administered 2021-01-28: 5000 [IU] via SUBCUTANEOUS
  Filled 2021-01-27: qty 1

## 2021-01-27 MED ORDER — ONDANSETRON HCL 4 MG PO TABS: 4.0000 mg | ORAL_TABLET | Freq: Four times a day (QID) | ORAL | Status: DC | PRN

## 2021-01-27 MED ORDER — HYDROCODONE-ACETAMINOPHEN 5-325 MG PO TABS: 1.0000 | ORAL_TABLET | ORAL | Status: DC | PRN

## 2021-01-27 MED ORDER — ONDANSETRON HCL 4 MG/2ML IJ SOLN: 4.0000 mg | Freq: Four times a day (QID) | INTRAMUSCULAR | Status: DC | PRN

## 2021-01-27 MED ORDER — GADOBUTROL 1 MMOL/ML IV SOLN
8.3000 mL | Freq: Once | INTRAVENOUS | Status: AC | PRN
  Administered 2021-01-27: 8.3 mL via INTRAVENOUS

## 2021-01-27 MED ORDER — POLYETHYLENE GLYCOL 3350 17 G PO PACK: 17.0000 g | PACK | Freq: Two times a day (BID) | ORAL | Status: DC

## 2021-01-27 NOTE — ED Provider Notes (Signed)
Emergency Medicine Provider Triage Evaluation Note  Katelyn Bauer , a 30 y.o. female  was evaluated in triage.  Pt complains of gradual onset, constant, bilateral knee/leg pain x 3 months. Pt reports she has been seeing both her PCP and ortho for this - she recently saw DR. Bokshan who did xrays of her bilateral knees and plans to start PT in 2 weeks time. PT states she was told to come to the ED for an MRI. Per chart review ortho visit on 10/28 and diagnosed with patellofemoral pain. She also had a telemedicine visit with her PCP yesterday and was advised to call her orthopedists with complaint of worsening pain/leg weakness. Pt states that at times her legs will shake and buckle however she has not truly buckled/fallen. She has started using crutches she had at home due to the pain. She states she was medically discharged from the [facility] for "low bone density" and wants this evaluated as well. She also complains of back pain. No saddle anesthesia, urinary retention, urinary or bowel incontinence.   Review of Systems  Positive: + back pain, bilateral leg pain/weakness Negative: - retention, incontinence, saddle anesthesia  Physical Exam  There were no vitals taken for this visit. Gen:   Awake, no distress   Resp:  Normal effort  MSK:   Moves extremities without difficulty. Strength 5/5 to BLEs. Sensation intact throughout.   Medical Decision Making  Medically screening exam initiated at 10:48 AM.  Appropriate orders placed.  Katelyn Bauer was informed that the remainder of the evaluation will be completed by another provider, this initial triage assessment does not replace that evaluation, and the importance of remaining in the ED until their evaluation is complete.     Tanda Rockers, PA-C 01/27/21 1052    Benjiman Core, MD 01/27/21 1556

## 2021-01-27 NOTE — ED Notes (Signed)
Pt transported to MRI 

## 2021-01-27 NOTE — Progress Notes (Signed)
Pt reports that she feels like her face is breaking out and that she feels like she's having an allergic reaction to something. Pt's face assessed, only a few small bumps noted. Pt denies any itching and is not having any trouble breathing. Per pt she only feels like her face is breaking out and does not feel like it's spreading to any other part of her body. Pt not in any acute distress. Pt advised to call nurse if symptoms worsening. Will continue to monitor.

## 2021-01-27 NOTE — H&P (Signed)
History and Physical  Katelyn Bauer TKZ:[id number] DOB: May 22, 1990 DOA: 01/27/2021  PCP: Medicine, [facility] Patient coming from: Home  I have personally briefly reviewed patient's old medical records in [facility]   Chief Complaint: Lower extremity weakness and pain bilaterally  HPI: Katelyn Bauer is a 30 y.o. female with past medical history whose had some problems since August for lower extremity pain of her legs and knees followed up by an orthopedic surgery as an outpatient for lower extremity weakness on hip flexion and has been set up with physical therapy as an outpatient but over the last 6 days they have progressively gotten weaker and has started having pain, now when she walks her legs are trembling, with new intermittent numbness which is not persisting no loss of control sphincters.  No rashes, no fever no runny nose diarrhea or recent viral infection.  In the ED: MRI of the spine was done that shows 3 x 4 mm intramedullary lesion at L4-5 level probable small schwannoma, discussed with the neurologist and recommended an lumbar puncture, which cannot be done at bedside discussed with IR and they requested an MRI with contrast before performing ultrasound-guided lumbar puncture.  She has remained afebrile has no leukocytosis no rashes.   Review of Systems: All systems reviewed and apart from history of presenting illness, are negative.  Past Medical History:  Diagnosis Date   Low bone density    Medical history non-contributory    Past Surgical History:  Procedure Laterality Date   NO PAST SURGERIES     Social History:  reports that she has quit smoking. She has never used smokeless tobacco. She reports that she does not drink alcohol and does not use drugs.   Allergies  Allergen Reactions   Penicillins Swelling and Other (See Comments)    Reaction:  All over body swelling  Has patient had a PCN reaction causing immediate rash,  facial/tongue/throat swelling, SOB or lightheadedness with hypotension: Yes Has patient had a PCN reaction causing severe rash involving mucus membranes or skin necrosis: No Has patient had a PCN reaction that required hospitalization No Has patient had a PCN reaction occurring within the last 10 years: No If all of the above answers are "NO", then may proceed with Cephalosporin use.      Family History  Problem Relation Age of Onset   Lung cancer Paternal Grandmother        2nd hand smoking    Brain cancer Paternal Grandmother      Prior to Admission medications   Not on File   Physical Exam: Vitals:   01/27/21 1445 01/27/21 1515 01/27/21 1530 01/27/21 1630  BP: 112/76 110/68 105/79 108/74  Pulse: 77 73 67 70  Resp: 18 13 17 18   Temp:      TempSrc:      SpO2: 100% 98% 100% 99%    General exam: Moderately built and nourished patient, lying comfortably supine on the gurney in no obvious distress. Head, eyes and ENT: Nontraumatic and normocephalic. Pupils equally reacting to light and accommodation. Oral mucosa moist. Neck: Supple. No JVD, carotid bruit or thyromegaly. Lymphatics: No lymphadenopathy. Respiratory system: Clear to auscultation. No increased work of breathing. Cardiovascular system: S1 and S2 heard, RRR. No JVD, murmurs, gallops, clicks or pedal edema. Gastrointestinal system: Abdomen is nondistended, soft and nontender. Normal bowel sounds heard. No organomegaly or masses appreciated. Central nervous system: Awake alert and oriented x3 muscle strength is 4-5 on both lower  extremities bilaterally deep tendon reflexes are present sensation is intact, the rest of her neurological exam is intact Extremities:  Peripheral pulses symmetrically felt.  Skin: No rashes or acute findings. Musculoskeletal system: Negative exam. Psychiatry: Pleasant and cooperative.   Labs on Admission:  Basic Metabolic Panel: Recent Labs  Lab 01/27/21 1203  NA 138  K 4.2  CL 106   CO2 25  GLUCOSE 79  BUN 11  CREATININE 0.66  CALCIUM 9.6   Liver Function Tests: Recent Labs  Lab 01/27/21 1203  AST 16  ALT 14  ALKPHOS 33*  BILITOT 0.6  PROT 7.2  ALBUMIN 3.7   No results for input(s): LIPASE, AMYLASE in the last 168 hours. No results for input(s): AMMONIA in the last 168 hours. CBC: Recent Labs  Lab 01/24/21 1057 01/27/21 1203  WBC 5.6 5.7  NEUTROABS  --  3.2  HGB 13.1 12.1  HCT 41.8 38.2  MCV 70* 69.7*  PLT 314 262   Cardiac Enzymes: No results for input(s): CKTOTAL, CKMB, CKMBINDEX, TROPONINI in the last 168 hours.  BNP (last 3 results) No results for input(s): PROBNP in the last 8760 hours. CBG: No results for input(s): GLUCAP in the last 168 hours.  Radiological Exams on Admission: MR LUMBAR SPINE WO CONTRAST  Result Date: 01/27/2021 CLINICAL DATA:  Leg pain over the last 3 months. EXAM: MRI LUMBAR SPINE WITHOUT CONTRAST TECHNIQUE: Multiplanar, multisequence MR imaging of the lumbar spine was performed. No intravenous contrast was administered. COMPARISON:  None. FINDINGS: Segmentation: The lowest lumbar type non-rib-bearing vertebra is labeled as L5. Alignment:  No vertebral subluxation is observed. Vertebrae:  Disc desiccation at L5-S1. Conus medullaris and cauda equina: Conus extends to the lower L2 level, mildly low. Approximately the L4-5 vertebral level, a 0.6 by 0.3 by 0.4 cm intermediate signal intensity lesion is present posteriorly in the midline along the filum terminale believed to be intradural and intramedullary, probably a schwannoma, less likely to be a small ependymoma. Paraspinal and other soft tissues: Unremarkable Disc levels: L1-2: Unremarkable. L2-3: Unremarkable. L3-4: Unremarkable. L4-5: Unremarkable. L5-S1: No impingement. Right paracentral and lateral recess disc protrusion. IMPRESSION: 1. 6 by 3 by 4 mm intradural intramedullary lesion posteriorly along the thecal sac at about the L4-5 level, probably a small schwannoma  although ependymoma can also occur along the cauda equina region, as can other much less common lesions. Completion lumbar MRI with contrast is recommended for baseline characterization. 2. Right paracentral and lateral recess disc protrusion at L5-S1 but without overt impingement. Electronically Signed   By: Gaylyn Rong M.D.   On: 01/27/2021 13:51    EKG: Independently reviewed. none  Assessment/Plan Active Problems:   Lower extremity weakness Patient needs an LP, but before proceeding with LP IR is requesting an MRI with contrast due to her small schwannoma, this was discussed with the neurosurgeon and they think it is very unlikely that this schwannoma is causing her symptoms.  Discussed with neurologist recommended an LP. On January 24, 2021 her TSH was 1.5 Free T4 was unremarkable Ferritin 135, pregnancy test was negative.  Imaging done recently by her orthopedics physician was unremarkable. Check a CK, ANA rheumatoid factor, HIV and RPR. She denies any viral infections diarrhea or bloody stools. Nobody sick at home she relates she multiple vaccines back in August.     DVT Prophylaxis: heparin Code Status: full  Family Communication: none  Disposition Plan: observation     Given the aforementioned, the predictability of an adverse outcome is  felt to be significant. I expect that the patient will require at least 2 midnights in the hospital to treat this condition.  Marinda Elk MD [facility]   01/27/2021, 5:35 PM

## 2021-01-27 NOTE — ED Triage Notes (Signed)
Pt states she was medically discharged from [facility] for low bone density.  Reports pain to bilateral legs since August. States she was seen by Ortho and referred to the ED for a MRI.  Taking Ibuprofen without relief.

## 2021-01-27 NOTE — ED Provider Notes (Signed)
[facility] EMERGENCY DEPARTMENT Provider Note   CSN: [id number] Arrival date & time: 01/27/21  1020     History Chief Complaint  Patient presents with   Leg Pain    Shenekia Reitsma is a 30 y.o. female.  Patient is a 30 year old female who presents with leg weakness.  She has had some problems with her legs since August.  She had at that time joined the [employer].  She started having some pain in her feet.  This progressed up to her legs and her knees.  She was diagnosed with osteopenia on bone density scan as well as stress fractures in her bilateral lower extremities.  She was discharged from the [facility].  She has had progressive pain in her knees that goes down her legs.  She has followed up with an orthopedic surgeon.  The orthopedist noted that she had some weak muscles on hip flexion and thought this may be contributing to her knee pain.  He had set her up to start some physical therapy.  She says over the last 6 days she has had progressive weakness in her legs.  She still has pain from her knees down to her feet.  However now when she walks she feels like her legs are weak and tremble.  She does not feel like they are trembling because of the pain but rather because of weakness.  She has some intermittent numbness in her feet but no persistent numbness.  No loss of bowel or bladder control.  She does have some pain to her low back.  She says it is in the middle but also across her low back.  When she walks sometimes she will feel it radiate down her legs but its not persistent.  No weakness in her arms.  No fevers.      Past Medical History:  Diagnosis Date   Low bone density    Medical history non-contributory     Patient Active Problem List   Diagnosis Date Noted   Lower extremity weakness 01/27/2021   Vitamin D deficiency 04/16/2017   Elevated hemoglobin A1c 04/16/2017   GDM (gestational diabetes mellitus), class A1 02/19/2016    Past Surgical History:   Procedure Laterality Date   NO PAST SURGERIES       OB History     Gravida  2   Para  2   Term  2   Preterm  0   AB  0   Living  2      SAB  0   IAB  0   Ectopic  0   Multiple  0   Live Births  2        Obstetric Comments  Delivered in [place].  History of Twins runs in the family. Patient is a twin And her father is a twin.         Family History  Problem Relation Age of Onset   Lung cancer Paternal Grandmother        2nd hand smoking    Brain cancer Paternal Grandmother     Social History   Tobacco Use   Smoking status: Former   Smokeless tobacco: Never  Substance Use Topics   Alcohol use: No    Alcohol/week: 0.0 standard drinks    Comment: Occasional   Drug use: No    Home Medications Prior to Admission medications   Medication Sig Start Date End Date Taking? Authorizing Provider  CALCIUM PO Take 1 tablet by  mouth daily.   Yes [provider]  ibuprofen (ADVIL) 800 MG tablet Take 800 mg by mouth every 8 (eight) hours as needed for mild pain.   Yes [provider]  VITAMIN D PO Take 1 tablet by mouth daily.   Yes [provider]    Allergies    Penicillins  Review of Systems   Review of Systems  Constitutional:  Negative for chills, diaphoresis, fatigue and fever.  HENT:  Negative for congestion, rhinorrhea and sneezing.   Eyes: Negative.   Respiratory:  Negative for cough, chest tightness and shortness of breath.   Cardiovascular:  Negative for chest pain and leg swelling.  Gastrointestinal:  Negative for abdominal pain, blood in stool, diarrhea, nausea and vomiting.  Genitourinary:  Negative for difficulty urinating, flank pain, frequency and hematuria.  Musculoskeletal:  Positive for arthralgias and back pain.  Skin:  Negative for rash.  Neurological:  Positive for weakness and numbness. Negative for dizziness, speech difficulty and headaches.   Physical Exam Updated Vital Signs BP 111/69   Pulse  80   Temp 98.5 F (36.9 C) (Oral)   Resp 19   SpO2 100%   Physical Exam Constitutional:      Appearance: She is well-developed.  HENT:     Head: Normocephalic and atraumatic.  Eyes:     Pupils: Pupils are equal, round, and reactive to light.  Cardiovascular:     Rate and Rhythm: Normal rate and regular rhythm.     Heart sounds: Normal heart sounds.  Pulmonary:     Effort: Pulmonary effort is normal. No respiratory distress.     Breath sounds: Normal breath sounds. No wheezing or rales.  Chest:     Chest wall: No tenderness.  Abdominal:     General: Bowel sounds are normal.     Palpations: Abdomen is soft.     Tenderness: There is no abdominal tenderness. There is no guarding or rebound.  Musculoskeletal:        General: Normal range of motion.     Cervical back: Normal range of motion and neck supple.     Comments: No palpable tenderness to her spine or lower back.  Negative straight leg raise bilaterally.  She has some mild tenderness on palpation of her knees and lower extremities.  No swelling or deformity noted.  Pedal pulses are intact.  There is no rashes.  She has normal sensation distally.  Lymphadenopathy:     Cervical: No cervical adenopathy.  Skin:    General: Skin is warm and dry.     Findings: No rash.  Neurological:     Mental Status: She is alert and oriented to person, place, and time.     Comments: Motor 5/5 all extremities Sensation grossly intact to LT all extremities Finger to Nose intact, no pronator drift CN II-XII grossly intact Gait: She has a slow gait and her legs do seem a little weak on ambulation as she has some slower coordination and they tremble a bit with ambulation.     ED Results / Procedures / Treatments   Labs (all labs ordered are listed, but only abnormal results are displayed) Labs Reviewed  COMPREHENSIVE METABOLIC PANEL - Abnormal; Notable for the following components:      Result Value   Alkaline Phosphatase 33 (*)    All  other components within normal limits  CBC WITH DIFFERENTIAL/PLATELET - Abnormal; Notable for the following components:   RBC 5.48 (*)    MCV 69.7 (*)  MCH 22.1 (*)    All other components within normal limits  RESP PANEL BY RT-PCR (FLU A&B, COVID) ARPGX2  VITAMIN B12  I-STAT BETA HCG BLOOD, ED ([facility], [facility], [facility] ONLY)    EKG None  Radiology MR LUMBAR SPINE WO CONTRAST  Result Date: 01/27/2021 CLINICAL DATA:  Leg pain over the last 3 months. EXAM: MRI LUMBAR SPINE WITHOUT CONTRAST TECHNIQUE: Multiplanar, multisequence MR imaging of the lumbar spine was performed. No intravenous contrast was administered. COMPARISON:  None. FINDINGS: Segmentation: The lowest lumbar type non-rib-bearing vertebra is labeled as L5. Alignment:  No vertebral subluxation is observed. Vertebrae:  Disc desiccation at L5-S1. Conus medullaris and cauda equina: Conus extends to the lower L2 level, mildly low. Approximately the L4-5 vertebral level, a 0.6 by 0.3 by 0.4 cm intermediate signal intensity lesion is present posteriorly in the midline along the filum terminale believed to be intradural and intramedullary, probably a schwannoma, less likely to be a small ependymoma. Paraspinal and other soft tissues: Unremarkable Disc levels: L1-2: Unremarkable. L2-3: Unremarkable. L3-4: Unremarkable. L4-5: Unremarkable. L5-S1: No impingement. Right paracentral and lateral recess disc protrusion. IMPRESSION: 1. 6 by 3 by 4 mm intradural intramedullary lesion posteriorly along the thecal sac at about the L4-5 level, probably a small schwannoma although ependymoma can also occur along the cauda equina region, as can other much less common lesions. Completion lumbar MRI with contrast is recommended for baseline characterization. 2. Right paracentral and lateral recess disc protrusion at L5-S1 but without overt impingement. Electronically Signed   By: Van Clines M.D.   On: 01/27/2021 13:51   MR LUMBAR SPINE W CONTRAST  Result Date:  01/27/2021 CLINICAL DATA:  Mass along the cauda equina, for further characterization. EXAM: MRI LUMBAR SPINE WITH CONTRAST TECHNIQUE: Multiplanar and multiecho pulse sequences of the lumbar spine were obtained with intravenous contrast. CONTRAST:  8.24mL GADAVIST GADOBUTROL 1 MMOL/ML IV SOLN COMPARISON:  Noncontrast MRI from 01/27/2021 FINDINGS: At the L4-5 level, the small posterior mass along the cauda equina diffusely and homogeneously enhances, measuring 0.5 by 0.4 by 0.5 cm on image 8 series 4 and image 29 series 5. Sharp and well-defined border. No additional significant enhancing lesions are identified. No abnormal paraspinal enhancing process. IMPRESSION: 1. 5 by 4 by 5 mm posterior mass in the midline of the thecal sac along the cauda equina at about the L4-5 level posteriorly has diffuse homogeneous sharply defined enhancement. A small schwannoma remains a top differential diagnostic consideration. Electronically Signed   By: Van Clines M.D.   On: 01/27/2021 17:39    Procedures Procedures   Medications Ordered in ED Medications  gadobutrol (GADAVIST) 1 MMOL/ML injection 8.3 mL (8.3 mLs Intravenous Contrast Given 01/27/21 1713)    ED Course  I have reviewed the triage vital signs and the nursing notes.  Pertinent labs & imaging results that were available during my care of the patient were reviewed by me and considered in my medical decision making (see chart for details).    MDM Rules/Calculators/A&P                           Patient is a 30 year old female who has had some progressive pain in her lower extremities since August.  Over the last 6 days she has had some progressive weakness in her legs.  She does not have any other neurologic deficits.  On ambulation she did have some noticeable weakness in both of her legs.  She has  no numbness or other suggestions of cauda equina.  She had an MRI of her back that was noncontrast that showed a possible schwannoma versus other  lesion.  This was at the L4-L5 area.  I discussed this with neurosurgery.  Dr. Trenton Gammon reviewed the images and did not feel that this would be causing her leg weakness.  I spoke with Dr. Rory Percy.  He felt that even though she had pain since August with the new onset weakness, we cannot exclude Guillain-Barr or other etiologies.  He recommended an MRI with contrast and an LP.  Given the lesion that is at the L4-L5 region, I felt that it would be best to have the LP done under fluoroscopy.  I spoke with Dr. Carlis Abbott with radiology.  He is happy to do the lumbar puncture although he felt that the MRI with contrast should be done first.  Given this, he would not be able to do the LP until the morning.  He does say that he is working tomorrow and is able to do it in the morning.  Given this, I did discuss the findings with the hospitalist who will admit the patient. Final Clinical Impression(s) / ED Diagnoses Final diagnoses:  Leg weakness, bilateral    Rx / DC Orders ED Discharge Orders     None        Malvin Johns, MD 01/27/21 2013

## 2021-01-28 ENCOUNTER — Observation Stay (HOSPITAL_COMMUNITY): Payer: Medicaid Other

## 2021-01-28 DIAGNOSIS — R29898 Other symptoms and signs involving the musculoskeletal system: Secondary | ICD-10-CM | POA: Diagnosis not present

## 2021-01-28 LAB — COMPREHENSIVE METABOLIC PANEL
ALT: 15 U/L (ref 0–44)
AST: 14 U/L — ABNORMAL LOW (ref 15–41)
Albumin: 3.4 g/dL — ABNORMAL LOW (ref 3.5–5.0)
Alkaline Phosphatase: 27 U/L — ABNORMAL LOW (ref 38–126)
Anion gap: 4 — ABNORMAL LOW (ref 5–15)
BUN: 13 mg/dL (ref 6–20)
CO2: 25 mmol/L (ref 22–32)
Calcium: 9 mg/dL (ref 8.9–10.3)
Chloride: 107 mmol/L (ref 98–111)
Creatinine, Ser: 0.68 mg/dL (ref 0.44–1.00)
GFR, Estimated: >60 mL/min (ref >60)
Glucose, Bld: 96 mg/dL (ref 70–99)
Potassium: 4.4 mmol/L (ref 3.5–5.1)
Sodium: 136 mmol/L (ref 135–145)
Total Bilirubin: 0.5 mg/dL (ref 0.3–1.2)
Total Protein: 6.9 g/dL (ref 6.5–8.1)

## 2021-01-28 LAB — SEDIMENTATION RATE: Sed Rate: 18 mm/hr (ref 0–22)

## 2021-01-28 LAB — RPR: RPR Ser Ql: NONREACTIVE

## 2021-01-28 LAB — C-REACTIVE PROTEIN: CRP: 0.6 mg/dL (ref <1.0)

## 2021-01-28 IMAGING — MR MR THORACIC SPINE W/O CM
5 of 6 series · 24 of 48 positions shown · IV contrast (agent unspecified)
Comparison: Cervical spine MRI today reported separately. Lumbar
MRI yesterday.

CLINICAL DATA: 30-year-old female with progressive lower extremity
symptoms for 3 months. Myelopathy suspected on clinical exam. Lumbar
MRI without and with contrast demonstrates small enhancing 5 mm
probable nerve sheath tumor of the lower cauda equina.

EXAM:
MRI THORACIC SPINE WITHOUT CONTRAST
TECHNIQUE: Multiplanar, multisequence MR imaging of the thoracic spine was
performed. No intravenous contrast was administered.

[Series 23: T1 · sagittal · 6.0mm · 1.23mm/px · 2 of 8 slices shown (1 of 2)]
[im 1/8]
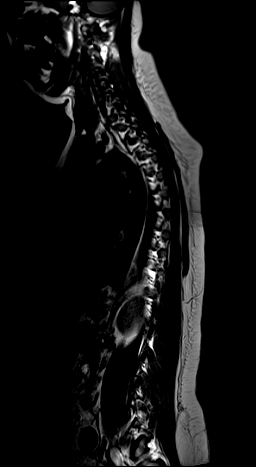
[im 8/8]
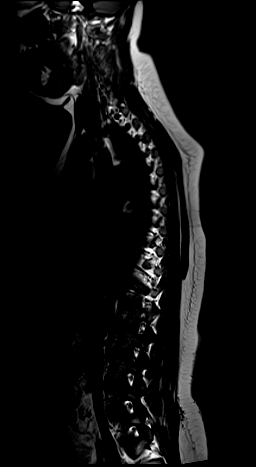

[Series 24: T2 · sagittal · 3.0mm · 0.76mm/px · 6 of 14 slices shown (1 of 2)]
[im 1/14]
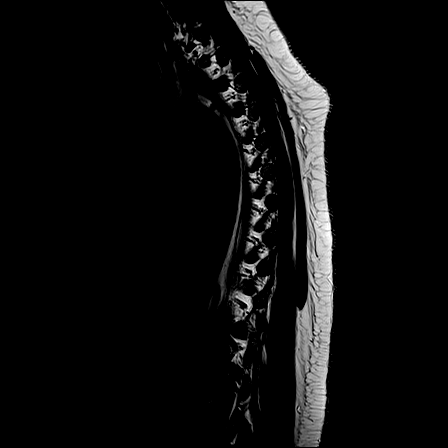
[im 3/14]
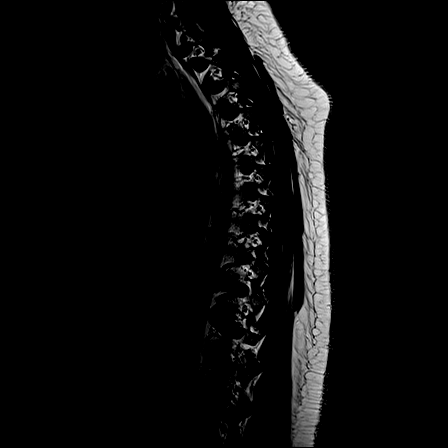
[im 6/14]
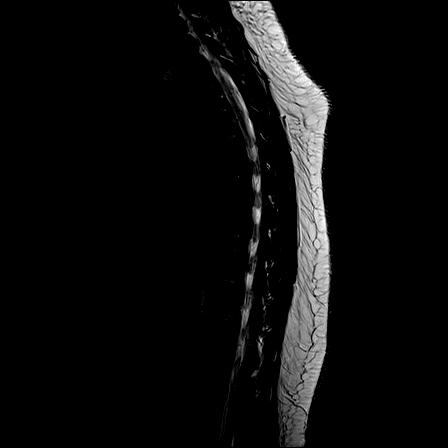
[im 8/14]
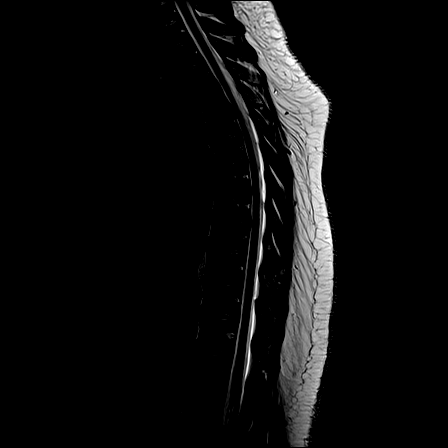
[im 11/14]
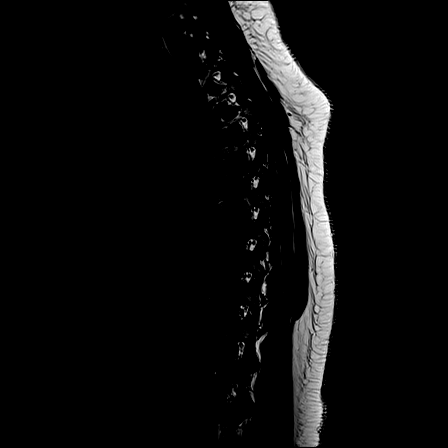
[im 14/14]
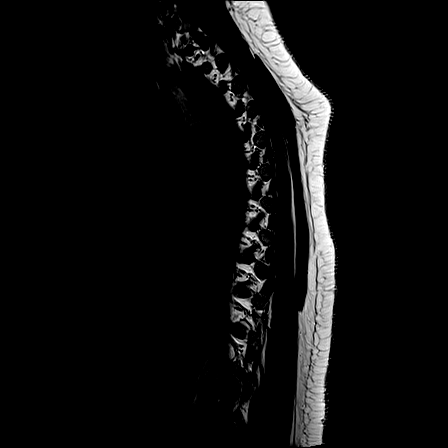

[Series 25: T1 · sagittal · 3.0mm · 0.76mm/px · 6 of 14 slices shown (2 of 2)]
[im 1/14]
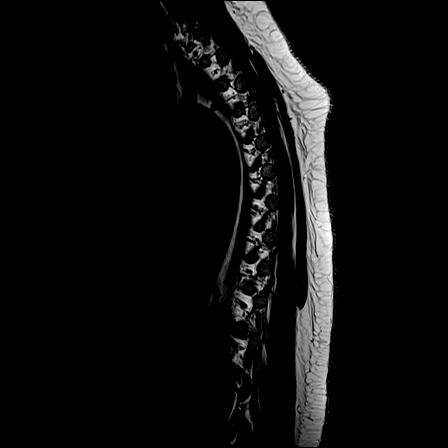
[im 3/14]
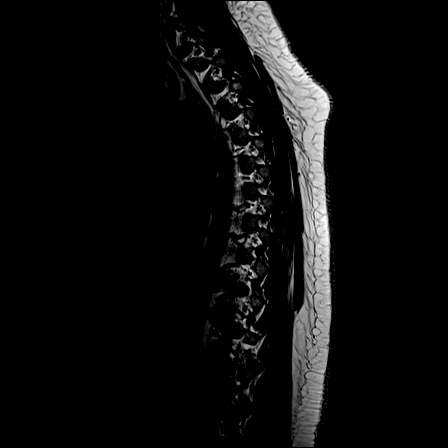
[im 6/14]
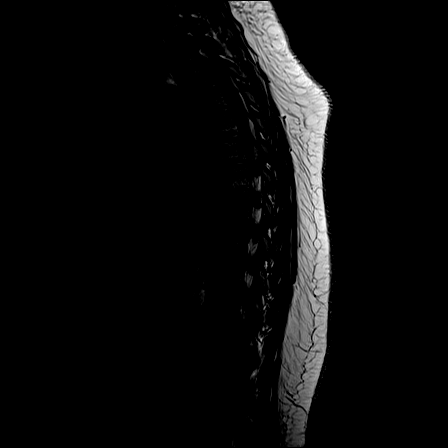
[im 8/14]
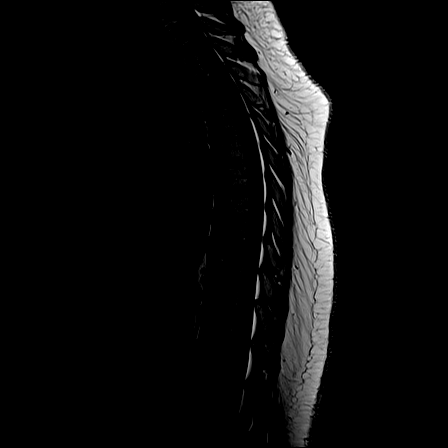
[im 11/14]
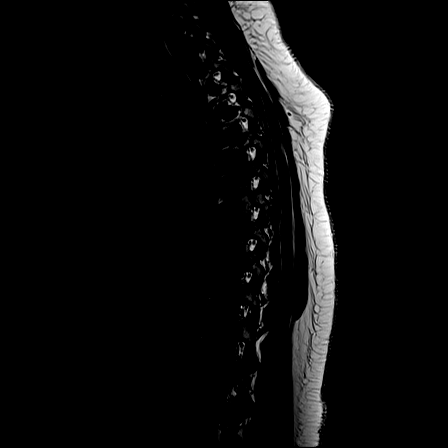
[im 14/14]
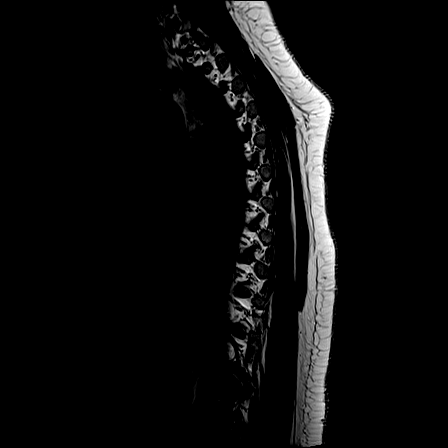

[Series 26: STIR · sagittal · 3.0mm · 0.38mm/px · 2 of 14 slices shown]
[im 1/14]
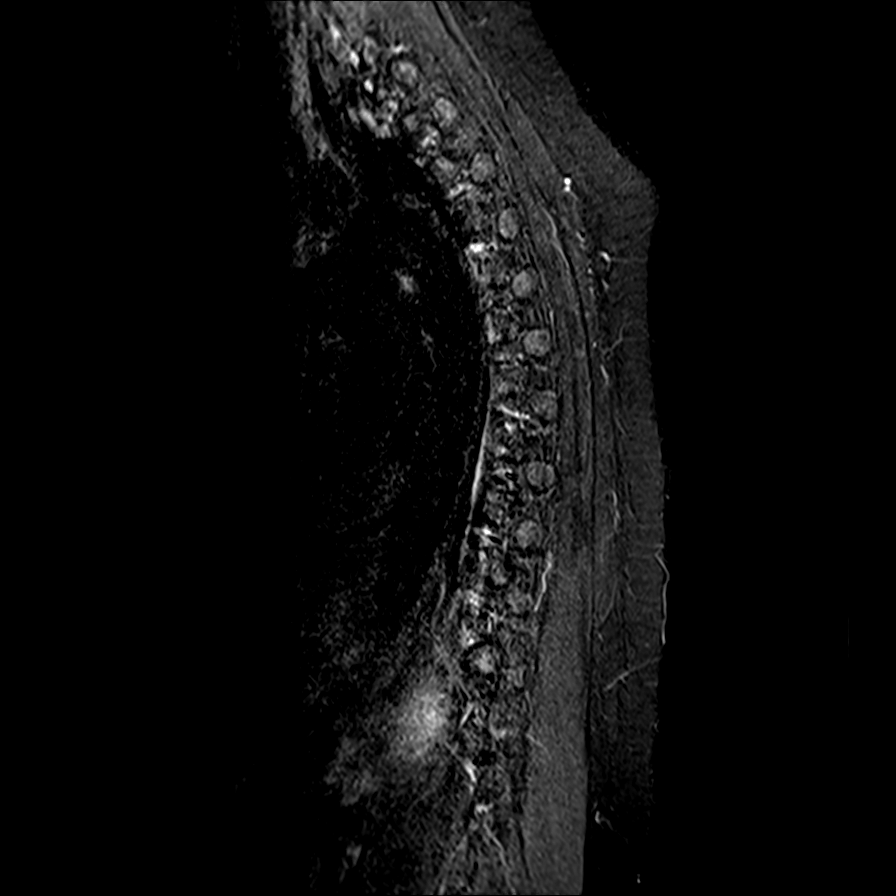
[im 3/14]
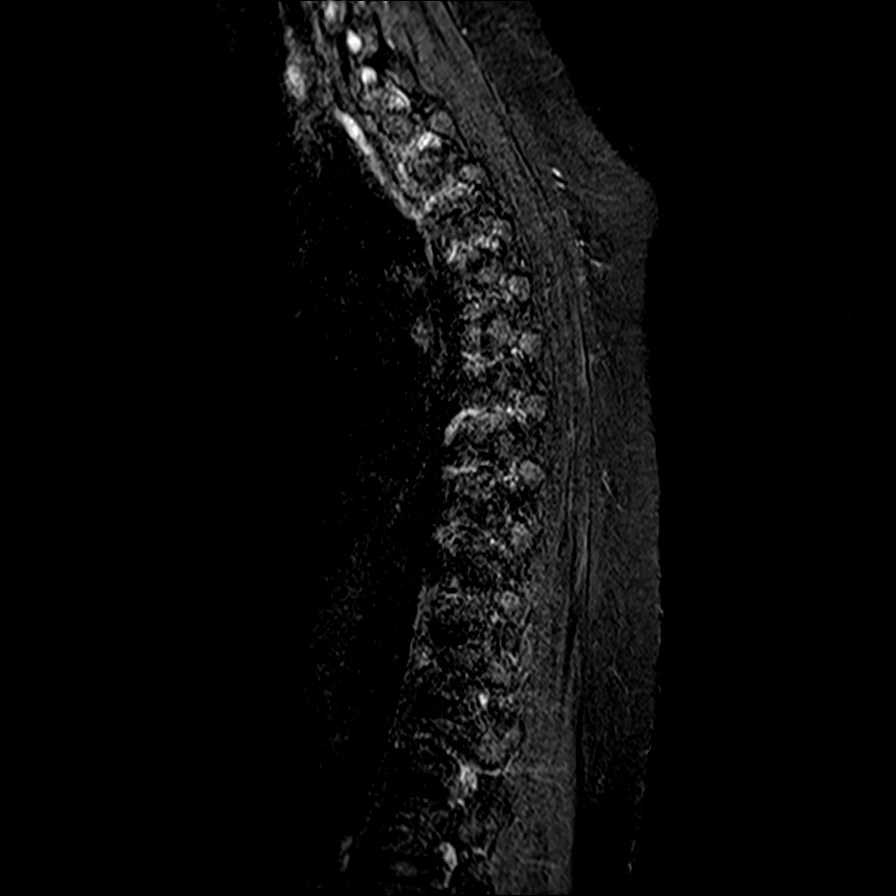

[Series 27: T2 · axial · 4.0mm · 0.59mm/px · z∈[-337,-117]mm · 8 of 36 slices shown (2 of 2)]
[im 1/36]
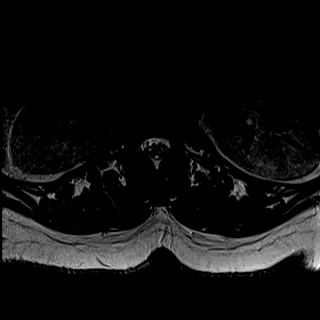
[im 6/36]
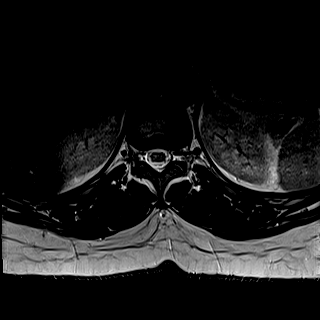
[im 11/36]
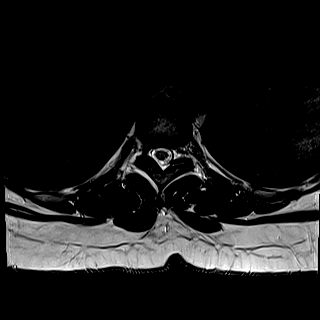
[im 17/36]
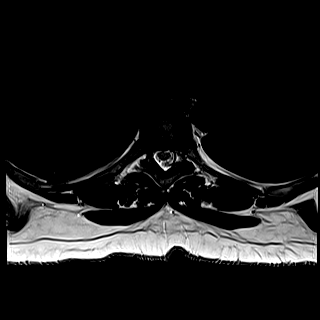
[im 19/36]
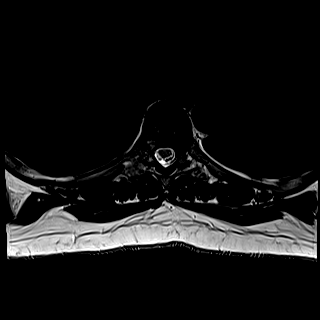
[im 25/36]
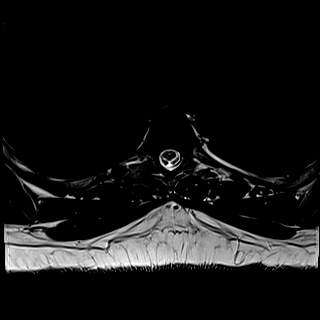
[im 30/36]
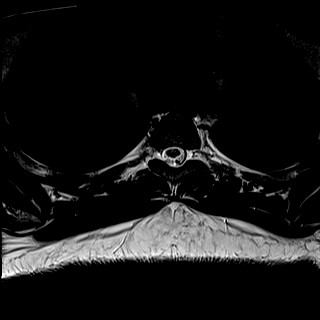
[im 36/36]
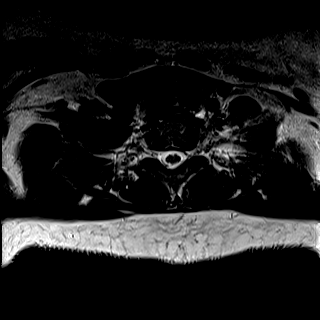

[24 of 48 positions shown; findings below may reference images not displayed]

FINDINGS: Limited cervical spine imaging:  Reported separately today.

Thoracic spine segmentation: Thoracic segmentation seems to be
normal, but this reveals transitional lumbosacral anatomy, with the
lowest ribs at what was designated L1 yesterday. Subsequently, the
L5 level is sacralized. Correlation with radiographs is recommended
prior to any operative intervention.

Alignment: Borderline to mildly exaggerated upper thoracic kyphosis.
There is slight levoconvex thoracic scoliosis. No spondylolisthesis.

Vertebrae: No marrow edema or evidence of acute osseous abnormality.
Visualized bone marrow signal is within normal limits.

Cord: Motion artifact on sagittal STIR imaging. Spinal cord signal
and morphology remain normal despite some thoracic spine
degenerative findings detailed below. Conus medullaris is normal at
T12-L1.

Paraspinal and other soft tissues: Negative.

Disc levels:

T1-T2: Negative.

T2-T3: Negative.

T3-T4: Negative.

T4-T5: Negative.

T5-T6: Subtle right paracentral disc protrusion (series 28, image
14). No stenosis.

T6-T7: Subtle right paracentral disc protrusion (series 27, image 17
and series 24, image 7. No stenosis.

T7-T8: Slightly larger central disc protrusion (series 28, image 20)
effaces the ventral CSF space. But there is no stenosis.

T8-T9: Borderline to mild facet hypertrophy on the left. No
stenosis.

T9-T10: Negative.

T10-T11: Negative.

T11-T12: Negative.

T12-L1: Negative.
IMPRESSION: 1. Transitional lumbosacral anatomy is apparent when numbering from
the skull base. L5 level appears to be fully sacralized (such that
the cauda equina lesion described yesterday is actually at L3-L4).
Correlation with radiographs is recommended prior to any operative
intervention.

2. Normal thoracic spinal cord.

3. Small thoracic disc herniations T5-T6 through T7-T8. No
significant spinal stenosis or associated neural impingement.

## 2021-01-28 IMAGING — MR MR CERVICAL SPINE W/O CM
5 series · 37 of 48 positions shown · IV contrast (agent unspecified)
Comparison: Lumbar MRI [DATE].

CLINICAL DATA: 30-year-old female with progressive lower extremity
symptoms for 3 months. Myelopathy suspected on clinical exam. Lumbar
MRI without and with contrast demonstrates small enhancing 5 mm
probable nerve sheath tumor of the lower cauda equina.

EXAM:
MRI CERVICAL SPINE WITHOUT CONTRAST
TECHNIQUE: Multiplanar, multisequence MR imaging of the cervical spine was
performed. No intravenous contrast was administered.

[Series 5: T2 · sagittal · 3.0mm · 0.69mm/px · 4 of 12 slices shown (1 of 2)]
[im 1/12]
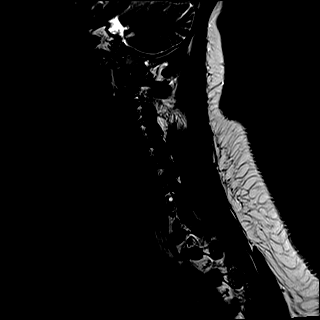
[im 4/12]
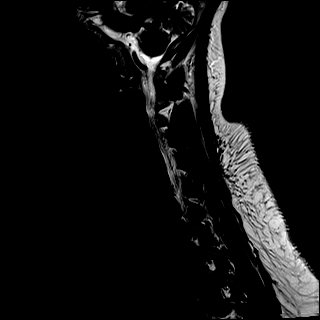
[im 8/12]
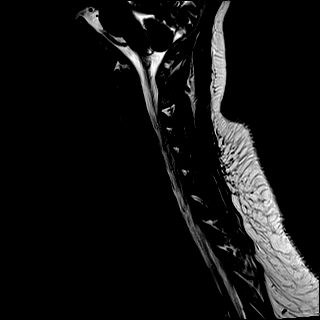
[im 12/12]
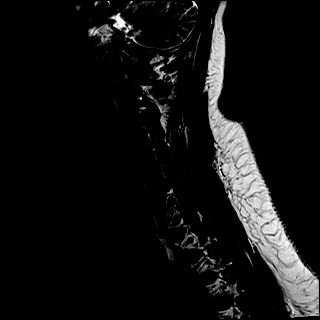

[Series 6: T1 · sagittal · 3.0mm · 0.69mm/px · 5 of 12 slices shown]
[im 1/12]
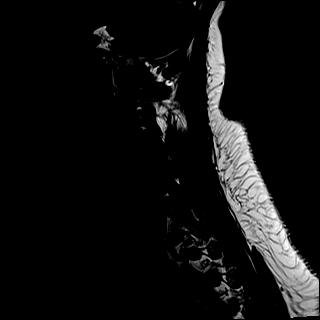
[im 3/12]
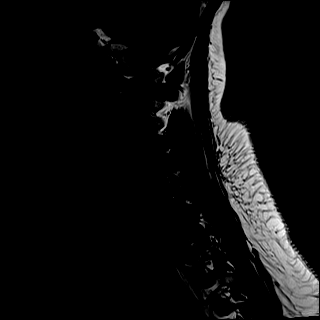
[im 6/12]
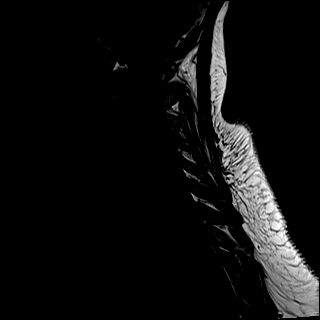
[im 9/12]
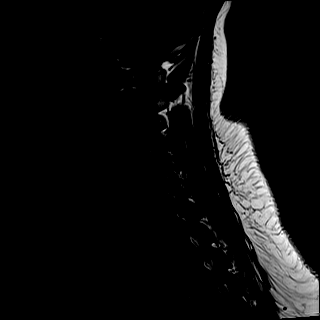
[im 12/12]
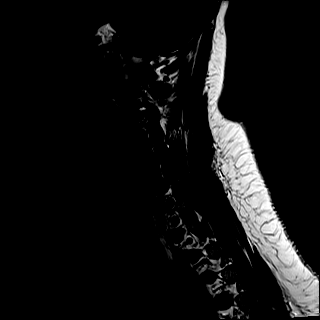

[Series 7: STIR · sagittal · 3.0mm · 0.86mm/px · 5 of 12 slices shown]
[im 1/12]
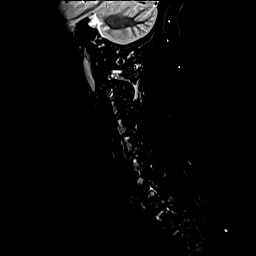
[im 3/12]
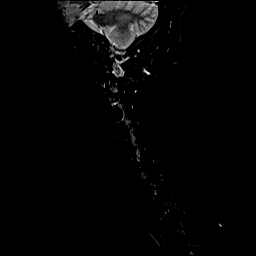
[im 6/12]
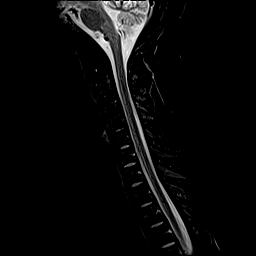
[im 9/12]
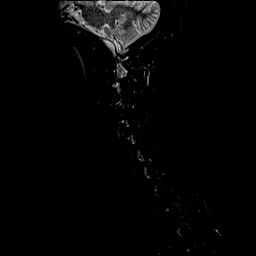
[im 12/12]
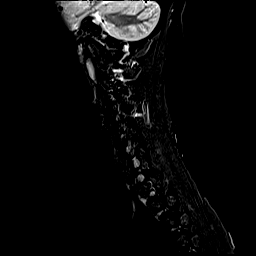

[Series 8: T2 · axial · 3.0mm · 0.66mm/px · z∈[-115,-4]mm · 15 of 40 slices shown (2 of 2)]
[im 1/40]
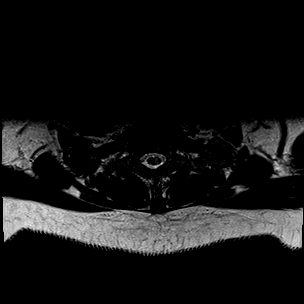
[im 3/40]
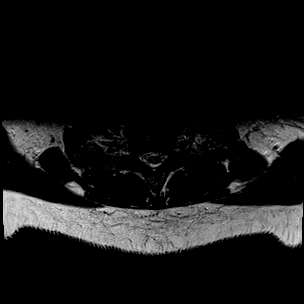
[im 5/40]
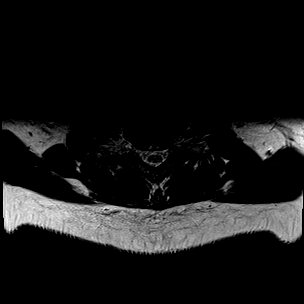
[im 8/40]
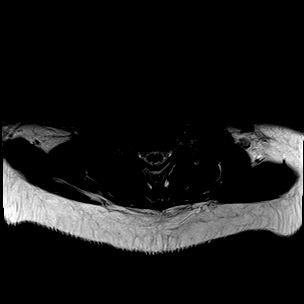
[im 10/40]
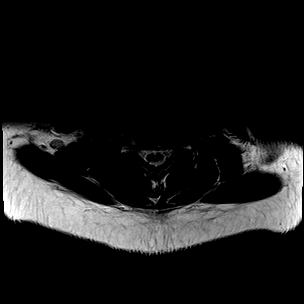
[im 13/40]
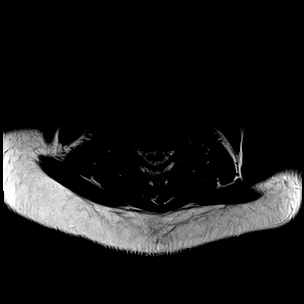
[im 15/40]
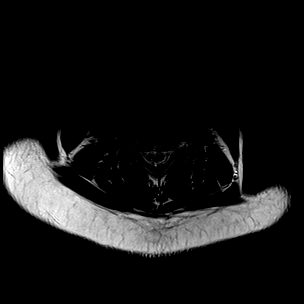
[im 18/40]
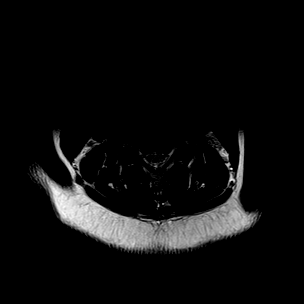
[im 20/40]
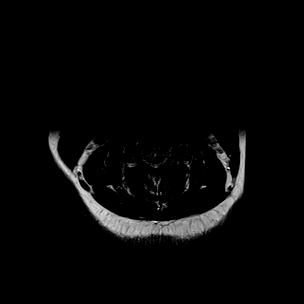
[im 22/40]
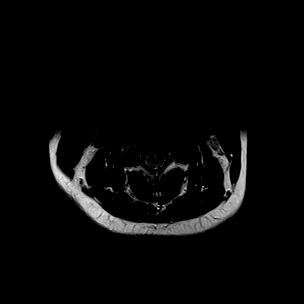
[im 25/40]
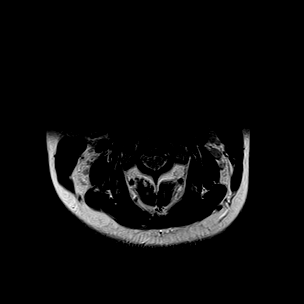
[im 27/40]
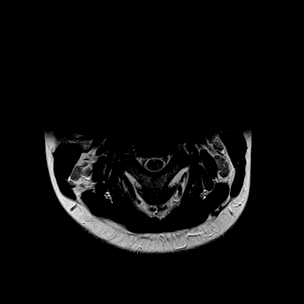
[im 32/40]
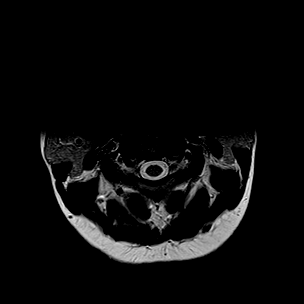
[im 35/40]
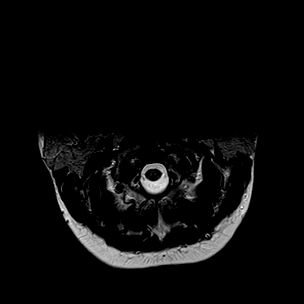
[im 37/40]
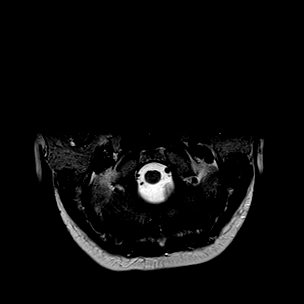

[Series 9: GRE · axial · 3.0mm · 0.39mm/px · z∈[-109,-4]mm · 8 of 40 slices shown]
[im 3/40]
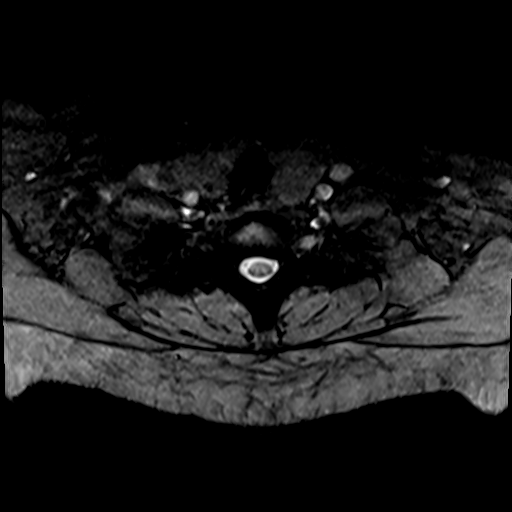
[im 8/40]
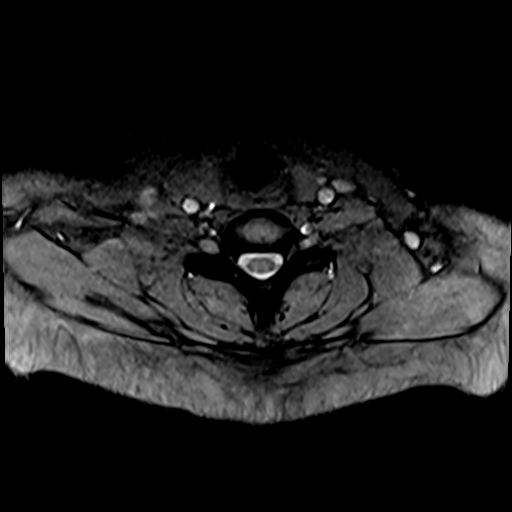
[im 13/40]
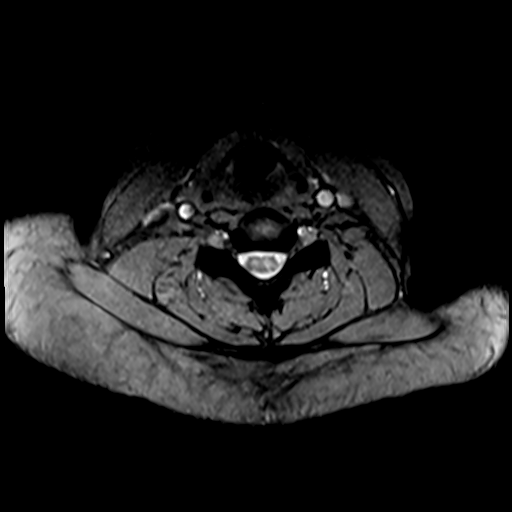
[im 18/40]
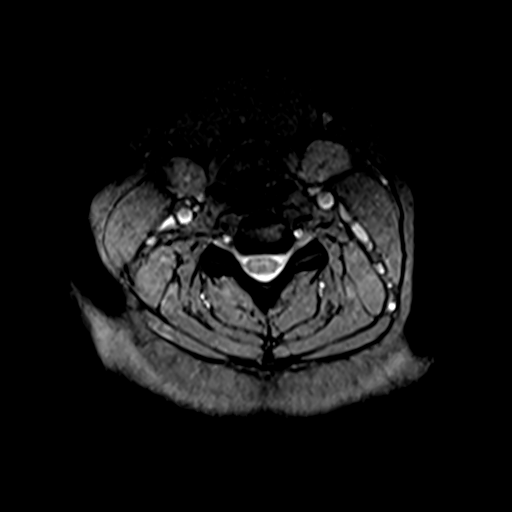
[im 22/40]
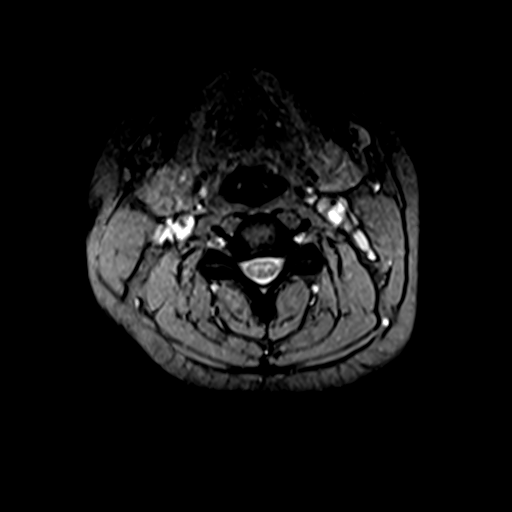
[im 27/40]
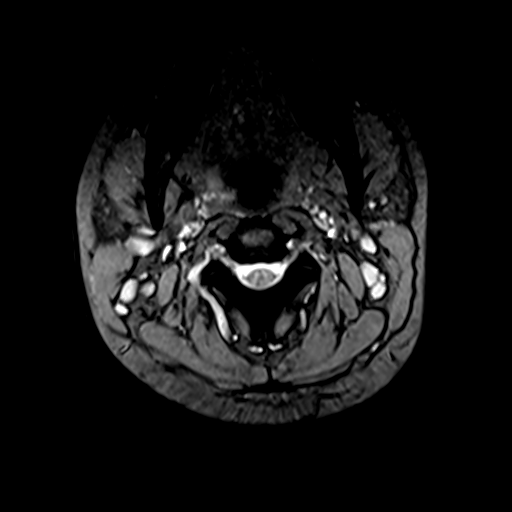
[im 32/40]
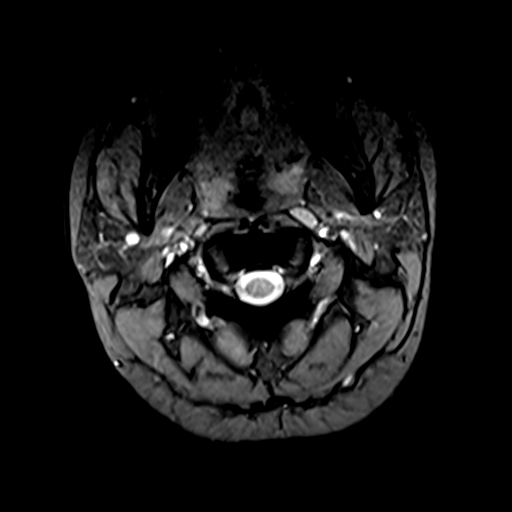
[im 37/40]
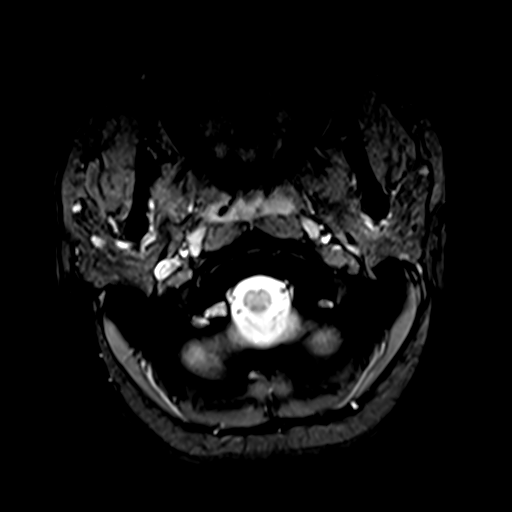

[37 of 48 positions shown; findings below may reference images not displayed]

FINDINGS: Alignment: Straightening of cervical lordosis. No spondylolisthesis.

Vertebrae: No marrow edema or evidence of acute osseous abnormality.
Visualized bone marrow signal is within normal limits.

Cord: Normal. No spinal cord signal abnormality identified. Canal
patency is within normal limits.

Posterior Fossa, vertebral arteries, paraspinal tissues:
Cervicomedullary junction is within normal limits. Negative visible
posterior fossa. Preserved major vascular flow voids in the neck.
Left vertebral artery appears mildly dominant.

Visible neck soft tissues are within normal limits; 7 mm mildly T2
hyperintense left thyroid nodule Not clinically significant; no
follow-up imaging recommended (ref: [HOSPITAL]. [DATE]): 143-50).

Disc levels:

C2-C3:  Negative.

C3-C4: Mild disc desiccation. Borderline to mild foraminal disc
bulging and bilateral C4 foraminal stenosis. No spinal stenosis.

C4-C5:  Negative.

C5-C6:  Negative.

C6-C7:  Negative.

C7-T1:  Mild facet hypertrophy. No stenosis.

Thoracic spine is detailed separately today.
IMPRESSION: Essentially normal MRI appearance of the cervical spine. No cervical
cord signal abnormality.
Minor disc bulging at C3-C4.  No spinal stenosis.

## 2021-01-28 MED ORDER — CYANOCOBALAMIN 1000 MCG/ML IJ SOLN
1000.0000 ug | Freq: Every day | INTRAMUSCULAR | Status: DC
  Filled 2021-01-28: qty 1

## 2021-01-28 MED ORDER — CYANOCOBALAMIN 1000 MCG/ML IJ SOLN
1000.0000 ug | Freq: Once | INTRAMUSCULAR | Status: AC
Start: 2021-01-28 — End: 2021-01-28
  Administered 2021-01-28: 1000 ug via INTRAMUSCULAR
  Filled 2021-01-28: qty 1

## 2021-01-28 NOTE — Progress Notes (Addendum)
Pt arrived to dept seeking information regarding pt condition. This rn directed father into pt room to address questions and concerns with him and pt. Father explains pt came for a "bone density scan" and express frustration that this has not been done. Father express frustration that pt has already had MRIs and express frustration with the most recent addition of another MRI recently done today. Father wanting to know results of most recent MRI and know the next plan of tx. When this rn asked pt for what she knows and understands, pt explains that mds have reviewed results of MRIs other than from one done today. Pt states she understands MRIs have been done to "look at leg". Pt in agreement with father with leaving if unable to get info regarding next plan of tx and get results of mri done today. This rn informed pt/father md will be notified of need to review mri results and plan of tx. Pt father states "we will be leaving in the next 15 minutes" This rn sent secure chat to md at 1305. Md responded within 5 mins via secure chat. Mob[employer] tech called this rn to inform pt father informed him they were leaving and no longer want to wait and talk about there concerns. Mobility tech states he encouraged pt father/pt to wait for rn with no avail. This rn arrived to room to find pt and father not in room pt hospital gown on bed and personal belongings not in room. This rn did not notice evidence of removed catheter in room. This rn notified md of events

## 2021-01-28 NOTE — Consult Note (Signed)
Neurology Consultation Reason for Consult: Abnormal gait Referring Physician: Aileen Fass, A  CC: Unsteady gait  History is obtained from: Patient  HPI: Katelyn Bauer is a 30 y.o. female with several weeks of paresthesias and pain in her legs.  The first thing that she noted was pain in her feet.  When asked about the character of the pain, she finds it difficult to describe exactly, when I ask what whether it is in the "bones, muscles, or skin" she states that she is not sure.  She does have pain in her knees, and she indicates around the patella.  Over the course of the past few months, it is progressed from just being around the feet to involving the knees as well.  She does not really note any numbness, just pain.  She has also noticed that she is having difficulties with her balance and finds her self to have an unsteady gait.  She was in the [employer] when the symptoms started, and sought care where they did a bone density scan, found her to have low bone density and therefore she has been discharged.  In the emergency department, she had an MRI of her lumbar spine which shows a small mass most consistent with a schwannoma.   ROS: A 14 point ROS was performed and is negative except as noted in the HPI.  Past Medical History:  Diagnosis Date   Low bone density    Medical history non-contributory      Family History  Problem Relation Age of Onset   Lung cancer Paternal Grandmother        2nd hand smoking    Brain cancer Paternal Grandmother      Social History:  reports that she has quit smoking. She has never used smokeless tobacco. She reports that she does not drink alcohol and does not use drugs.   Exam: Current vital signs: BP 110/67 (BP Location: Left Arm)   Pulse 84   Temp 99 F (37.2 C) (Oral)   Resp 18   SpO2 100%  Vital signs in last 24 hours: Temp:  [98.2 F (36.8 C)-99 F (37.2 C)] 99 F (37.2 C) (11/06 0016) Pulse Rate:  [67-92] 84 (11/06 0016) Resp:   [13-19] 18 (11/05 2027) BP: (102-119)/(59-79) 110/67 (11/06 0016) SpO2:  [98 %-100 %] 100 % (11/06 0016)   Physical Exam  Constitutional: Appears well-developed and well-nourished.  Psych: Affect appropriate to situation Eyes: No scleral injection HENT: No OP obstruction MSK: no joint deformities.  Cardiovascular: Normal rate and regular rhythm.  Respiratory: Effort normal, non-labored breathing GI: Soft.  No distension. There is no tenderness.  Skin: WDI  Neuro: Mental Status: Patient is awake, alert, oriented to person, place, month, year, and situation. Patient is able to give a clear and coherent history. No signs of aphasia or neglect Cranial Nerves: II: Visual Fields are full. Pupils are equal, round, and reactive to light.   III,IV, VI: EOMI without ptosis or diploplia.  V: Facial sensation is symmetric to temperature VII: Facial movement is symmetric.  VIII: hearing is intact to voice X: Uvula elevates symmetrically XI: Shoulder shrug is symmetric. XII: tongue is midline without atrophy or fasciculations.  Motor: Tone is normal. Bulk is normal. 5/5 strength was present in bilateral arms, she has 4/5 hip flexion, knee extension, dorsiflexion, plantar flexion bilaterally.  She may be slightly weaker on eversion on the right than the left, 4 -/5 on the right 4/5 on the left inversion is 4/5  bilaterally. Sensory: Sensation is symmetric to light touch and temperature in the arms and legs. Deep Tendon Reflexes: 2+ and symmetric in the biceps, brachioradialis, ankle and patellae.  Plantars: Toes are downgoing bilaterally.  Cerebellar: FNF and HKS are intact bilaterally   I have reviewed labs in epic and the results pertinent to this consultation are: B12 301 CMP unremarkable   I have reviewed the images obtained: MRI L-spine-small enhancing mass most consistent with schwannoma  Impression: 30 year old female with progressive bilateral lower extremity weakness and  unsteady gait.  There are no findings on exam to suggest a peripheral etiology (e.g. neuropathy).  My suspicion is that this represents some type of myelopathy, and I think that she needs further imaging.  I do not think that the schwannoma seen in her lumbar spine would be likely to be causing her symptoms.  She does have a borderline B12, and though it is above what we typically think of as the lower cutoff for normal, there are some people who may have symptoms in that range and therefore I would recommend parenteral repletion and checking MMA and homocystine.  Recommendations: 1) MRI C and T-spine 2) MMA, homocysteine, vitamin e, ESR, CRP 3) PT,OT   Roland Rack, MD [facility] [phone]  If 7pm- 7am, please page neurology on call as listed in [place].

## 2021-01-28 NOTE — Progress Notes (Signed)
[facility] PROGRESS NOTE    Progress Note  Katelyn Bauer  [id number] DOB: 08/22/1990 DOA: 01/27/2021 PCP: Medicine, [facility]     Brief Narrative:   Katelyn Bauer is an 30 y.o. female past medical history no significant past medical history that comes in for lower extremity weakness and pain. Neurology was consulted recommended an MRI nests or small schwannoma in the lumbar spine.  They also recommended an LP IR has been consulted for an LP   Assessment/Plan:   Active Problems:   Lower extremity weakness Pregnancy test negative. Sed rate is 18 CK 61 Vitamin B12 was 296 we will go ahead and start intramuscular repletion. ANA pending Rheumatoid factor pending HIV negative RPR pending IR has been consulted awaiting lumbar puncture.   DVT prophylaxis: lovenox Family Communication:none Status is: Observation  The patient remains OBS appropriate and will d/c before 2 midnights.       Code Status:     Code Status Orders  (From admission, onward)           Start     Ordered   01/27/21 2051  Full code  Continuous        01/27/21 2050           Code Status History     Date Active Date Inactive Code Status Order ID Comments User Context   03/20/2016 1154 03/21/2016 1717 Full Code [id number]  Lovenia Kim, MD Inpatient   03/19/2016 0823 03/20/2016 1154 Full Code [id number]  Gwen Pounds, CNM Inpatient         IV Access:   Peripheral IV   Procedures and diagnostic studies:   MR CERVICAL SPINE WO CONTRAST  Result Date: 01/28/2021 CLINICAL DATA:  30 year old female with progressive lower extremity symptoms for 3 months. Myelopathy suspected on clinical exam. 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 EXAM: MRI CERVICAL SPINE WITHOUT CONTRAST TECHNIQUE: Multiplanar, multisequence MR imaging of the cervical spine was performed. No intravenous  contrast was administered. COMPARISON:  Lumbar MRI 01/27/2021. FINDINGS: Alignment: Straightening of cervical lordosis. No spondylolisthesis. Vertebrae: No marrow edema or evidence of acute osseous abnormality. Visualized bone marrow signal is within normal limits. Cord: Normal. No spinal cord signal abnormality identified. Canal patency is within normal limits. Posterior Fossa, vertebral arteries, paraspinal tissues: Cervicomedullary junction is within normal limits. Negative visible posterior fossa. Preserved major vascular flow voids in the neck. Left vertebral artery appears mildly dominant. Visible neck soft tissues are within normal limits; 7 mm mildly T2 hyperintense left thyroid nodule Not clinically significant; no follow-up imaging recommended (ref: J Am Coll Radiol. 2015 Feb;12(2): 143-50). Disc levels: C2-C3:  Negative. C3-C4: Mild disc desiccation. Borderline to mild foraminal disc bulging and bilateral C4 foraminal stenosis. No spinal stenosis. C4-C5:  Negative. C5-C6:  Negative. C6-C7:  Negative. C7-T1:  Mild facet hypertrophy. No stenosis. Thoracic spine is detailed separately today. IMPRESSION: Essentially normal MRI appearance of the cervical spine. No cervical cord signal abnormality. Minor disc bulging at C3-C4.  No spinal stenosis. Electronically Signed   By: Genevie Ann M.D.   On: 01/28/2021 09:57   MR THORACIC SPINE WO CONTRAST  Result Date: 01/28/2021 CLINICAL DATA:  30 year old female with progressive lower extremity symptoms for 3 months. Myelopathy suspected on clinical exam. 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 EXAM: MRI THORACIC SPINE WITHOUT CONTRAST TECHNIQUE:  Multiplanar, multisequence MR imaging of the thoracic spine was performed. No intravenous contrast was administered. COMPARISON:  Cervical spine MRI today reported separately. Lumbar MRI yesterday. FINDINGS: Limited cervical spine imaging:   Reported separately today. Thoracic spine segmentation: Thoracic segmentation seems to be normal, but this reveals transitional lumbosacral anatomy, with the lowest ribs at what was designated L1 yesterday. Subsequently, the L5 level is sacralized. Correlation with radiographs is recommended prior to any operative intervention. Alignment: Borderline to mildly exaggerated upper thoracic kyphosis. There is slight levoconvex thoracic scoliosis. No spondylolisthesis. Vertebrae: No marrow edema or evidence of acute osseous abnormality. Visualized bone marrow signal is within normal limits. Cord: Motion artifact on sagittal STIR imaging. Spinal cord signal and morphology remain normal despite some thoracic spine degenerative findings detailed below. Conus medullaris is normal at T12-L1. Paraspinal and other soft tissues: Negative. Disc levels: T1-T2: Negative. T2-T3: Negative. T3-T4: Negative. T4-T5: Negative. T5-T6: Subtle right paracentral disc protrusion (series 28, image 14). No stenosis. T6-T7: Subtle right paracentral disc protrusion (series 27, image 17 and series 24, image 7. No stenosis. T7-T8: Slightly larger central disc protrusion (series 28, image 20) effaces the ventral CSF space. But there is no stenosis. T8-T9: Borderline to mild facet hypertrophy on the left. No stenosis. T9-T10: Negative. T10-T11: Negative. T11-T12: Negative. T12-L1: Negative. IMPRESSION: 1. 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 L5 level appears to be fully sacralized (such that the cauda equina lesion described yesterday is actually at L3-L4). Correlation with radiographs is recommended prior to any operative intervention. 2. Normal thoracic spinal cord. 3. Small thoracic disc herniations T5-T6 through T7-T8. No significant spinal stenosis or associated neural impingement. Electronically Signed   By: Genevie Ann M.D.   On: 01/28/2021 10:05   MR LUMBAR SPINE WO CONTRAST  Result Date:  01/27/2021 CLINICAL DATA:  Leg pain over the last 3 months. EXAM: MRI LUMBAR SPINE WITHOUT CONTRAST TECHNIQUE: Multiplanar, multisequence MR imaging of the lumbar spine was performed. No intravenous contrast was administered. COMPARISON:  None. FINDINGS: Segmentation: The lowest lumbar type non-rib-bearing vertebra is labeled as L5. Alignment:  No vertebral subluxation is observed. Vertebrae:  Disc desiccation at L5-S1. Conus medullaris and cauda equina: Conus extends to the lower L2 level, mildly low. Approximately the L4-5 vertebral level, a 0.6 by 0.3 by 0.4 cm intermediate signal intensity lesion is present posteriorly in the midline along the filum terminale believed to be intradural and intramedullary, probably a schwannoma, less likely to be a small ependymoma. Paraspinal and other soft tissues: Unremarkable Disc levels: L1-2: Unremarkable. L2-3: Unremarkable. L3-4: Unremarkable. L4-5: Unremarkable. L5-S1: No impingement. Right paracentral and lateral recess disc protrusion. IMPRESSION: 1. 6 by 3 by 4 mm intradural intramedullary lesion posteriorly along the thecal sac at about the L4-5 level, probably a small schwannoma although ependymoma can also occur along the cauda equina region, as can other much less common lesions. Completion lumbar MRI with contrast is recommended for baseline characterization. 2. Right paracentral and lateral recess disc protrusion at L5-S1 but without overt impingement. Electronically Signed   By: Van Clines M.D.   On: 01/27/2021 13:51   MR LUMBAR SPINE W CONTRAST  Result Date: 01/27/2021 CLINICAL DATA:  Mass along the cauda equina, for further characterization. EXAM: MRI LUMBAR SPINE WITH CONTRAST TECHNIQUE: Multiplanar and multiecho pulse sequences of the lumbar spine were obtained with intravenous contrast. CONTRAST:  8.23mL GADAVIST GADOBUTROL 1 MMOL/ML IV SOLN COMPARISON:  Noncontrast MRI from 01/27/2021 FINDINGS: At the L4-5 level, the small posterior  mass along  the cauda equina diffusely and homogeneously enhances, measuring 0.5 by 0.4 by 0.5 cm on image 8 series 4 and image 29 series 5. Sharp and well-defined border. No additional significant enhancing lesions are identified. No abnormal paraspinal enhancing process. IMPRESSION: 1. 5 by 4 by 5 mm posterior mass in the midline of the thecal sac along the cauda equina at about the L4-5 level posteriorly has diffuse homogeneous sharply defined enhancement. A small schwannoma remains a top differential diagnostic consideration. Electronically Signed   By: Van Clines M.D.   On: 01/27/2021 17:39     Medical Consultants:   None.   Subjective:    Ulice Dash no complaints unchanged from yesterday.  Objective:    Vitals:   01/27/21 1915 01/27/21 2027 01/28/21 0016 01/28/21 0617  BP: 111/69 112/72 110/67 112/63  Pulse: 80 84 84 77  Resp: 19 18    Temp:  98.2 F (36.8 C) 99 F (37.2 C) 99 F (37.2 C)  TempSrc:   Oral Oral  SpO2: 100% 100% 100% 100%   SpO2: 100 %  No intake or output data in the 24 hours ending 01/28/21 1035 There were no vitals filed for this visit.  Exam: General exam: In no acute distress. Respiratory system: Good air movement and clear to auscultation. Cardiovascular system: S1 & S2 heard, RRR. No JVD. Gastrointestinal system: Abdomen is nondistended, soft and nontender.  Extremities: No pedal edema. Skin: No rashes, lesions or ulcers Psychiatry: Judgement and insight appear normal. Mood & affect appropriate.    Data Reviewed:    Labs: Basic Metabolic Panel: Recent Labs  Lab 01/27/21 1203 01/27/21 2208 01/28/21 0345  NA 138  --  136  K 4.2  --  4.4  CL 106  --  107  CO2 25  --  25  GLUCOSE 79  --  96  BUN 11  --  13  CREATININE 0.66 0.74 0.68  CALCIUM 9.6  --  9.0   GFR Estimated Creatinine Clearance: 111.1 mL/min (by C-G formula based on SCr of 0.68 mg/dL). Liver Function Tests: Recent Labs  Lab 01/27/21 1203 01/28/21 0345  AST 16  14*  ALT 14 15  ALKPHOS 33* 27*  BILITOT 0.6 0.5  PROT 7.2 6.9  ALBUMIN 3.7 3.4*   No results for input(s): LIPASE, AMYLASE in the last 168 hours. No results for input(s): AMMONIA in the last 168 hours. Coagulation profile No results for input(s): INR, PROTIME in the last 168 hours. COVID-19 Labs  Recent Labs    01/28/21 0345  CRP 0.6    Lab Results  Component Value Date   SARSCOV2NAA NEGATIVE 01/27/2021    CBC: Recent Labs  Lab 01/24/21 1057 01/27/21 1203 01/27/21 2208  WBC 5.6 5.7 7.2  NEUTROABS  --  3.2  --   HGB 13.1 12.1 12.1  HCT 41.8 38.2 38.6  MCV 70* 69.7* 69.8*  PLT 314 262 261   Cardiac Enzymes: Recent Labs  Lab 01/27/21 2208  CKTOTAL 61   BNP (last 3 results) No results for input(s): PROBNP in the last 8760 hours. CBG: No results for input(s): GLUCAP in the last 168 hours. D-Dimer: No results for input(s): DDIMER in the last 72 hours. Hgb A1c: No results for input(s): HGBA1C in the last 72 hours. Lipid Profile: No results for input(s): CHOL, HDL, LDLCALC, TRIG, CHOLHDL, LDLDIRECT in the last 72 hours. Thyroid function studies: No results for input(s): TSH, T4TOTAL, T3FREE, THYROIDAB in the last 72 hours.  Invalid input(s): FREET3 Anemia work up: Recent Labs    01/27/21 1203 01/27/21 2208  VITAMINB12 301 296   Sepsis Labs: Recent Labs  Lab 01/24/21 1057 01/27/21 1203 01/27/21 2208  WBC 5.6 5.7 7.2   Microbiology Recent Results (from the past 240 hour(s))  Resp Panel by RT-PCR (Flu A&B, Covid) Nasopharyngeal Swab     Status: None   Collection Time: 01/27/21  7:17 PM   Specimen: Nasopharyngeal Swab; Nasopharyngeal(NP) swabs in vial transport medium  Result Value Ref Range Status   SARS Coronavirus 2 by RT PCR NEGATIVE NEGATIVE Final    Comment: (NOTE) SARS-CoV-2 target nucleic acids are NOT DETECTED.  The SARS-CoV-2 RNA is generally detectable in upper respiratory specimens during the acute phase of infection. The  lowest concentration of SARS-CoV-2 viral copies this assay can detect is 138 copies/mL. A negative result does not preclude SARS-Cov-2 infection and should not be used as the sole basis for treatment or other patient management decisions. A negative result may occur with  improper specimen collection/handling, submission of specimen other than nasopharyngeal swab, presence of viral mutation(s) within the areas targeted by this assay, and inadequate number of viral copies(<138 copies/mL). A negative result must be combined with clinical observations, patient history, and epidemiological information. The expected result is Negative.  Fact Sheet for Patients:  BloggerCourse.com  Fact Sheet for Healthcare Providers:  SeriousBroker.it  This test is no t yet approved or cleared by the [place] FDA and  has been authorized for detection and/or diagnosis of SARS-CoV-2 by FDA under an Emergency Use Authorization (EUA). This EUA will remain  in effect (meaning this test can be used) for the duration of the COVID-19 declaration under Section 564(b)(1) of the Act, 21 U.S.C.section 360bbb-3(b)(1), unless the authorization is terminated  or revoked sooner.       Influenza A by PCR NEGATIVE NEGATIVE Final   Influenza B by PCR NEGATIVE NEGATIVE Final    Comment: (NOTE) The Xpert Xpress SARS-CoV-2/FLU/RSV plus assay is intended as an aid in the diagnosis of influenza from Nasopharyngeal swab specimens and should not be used as a sole basis for treatment. Nasal washings and aspirates are unacceptable for Xpert Xpress SARS-CoV-2/FLU/RSV testing.  Fact Sheet for Patients: BloggerCourse.com  Fact Sheet for Healthcare Providers: SeriousBroker.it  This test is not yet approved or cleared by the [place] FDA and has been authorized for detection and/or diagnosis of SARS-CoV-2 by FDA under  an Emergency Use Authorization (EUA). This EUA will remain in effect (meaning this test can be used) for the duration of the COVID-19 declaration under Section 564(b)(1) of the Act, 21 U.S.C. section 360bbb-3(b)(1), unless the authorization is terminated or revoked.  Performed at [facility], [address], [place], [place] [address]      Medications:    heparin  5,000 Units Subcutaneous Q8H   polyethylene glycol  17 g Oral BID   Continuous Infusions:    LOS: 0 days   Marinda Elk  [facility]  01/28/2021, 10:35 AM

## 2021-01-28 NOTE — Progress Notes (Addendum)
MRI C and T-spine IMPRESSION: Essentially normal MRI appearance of the cervical spine. No cervical cord signal abnormality. Minor disc bulging at C3-C4.  No spinal stenosis. IMPRESSION: 1. 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 L5 level appears to be fully sacralized (such that the cauda equina lesion described yesterday is actually at L3-L4). Correlation with radiographs is recommended prior to any operative intervention. 2. Normal thoracic spinal cord. 3. Small thoracic disc herniations T5-T6 through T7-T8. No significant spinal stenosis or associated neural impingement.  ESR and CRP - wnl  UPDATED RECS -PT OT -F/u Vit e, MMA -Outpatient neurology follow up -For the ?schwannoma at the L3-4 level, d/w NSGY (curbside) - no emergent intervention.  Follow-up with Dr. Pool in the neurosurgery clinic as an outpatient.  -- Ashish Arora, MD Neurologist [facility] Pager: [phone]  

## 2021-01-29 LAB — RHEUMATOID FACTOR: Rheumatoid fact SerPl-aCnc: <10 IU/mL (ref <14.0)

## 2021-01-29 LAB — ANTIEXTRACTABLE NUCLEAR AG
ENA SM Ab Ser-aCnc: <0.2 AI (ref 0.0–0.9)
Ribonucleic Protein: 0.2 AI (ref 0.0–0.9)

## 2021-01-29 LAB — ANA: Anti Nuclear Antibody (ANA): NEGATIVE

## 2021-01-29 LAB — HOMOCYSTEINE: Homocysteine: 6.5 umol/L (ref 0.0–14.5)

## 2021-01-29 NOTE — Discharge Summary (Signed)
Physician Discharge Summary  Katelyn Bauer RJJ:[id number] DOB: May 06, 1990 DOA: 01/27/2021  PCP: [facility]  Admit date: 01/27/2021 Discharge date: 01/29/2021  Admitted From: home Disposition:  home        Left AMA  rRecommendations for Outpatient Follow-up:  Follow up with PCP in 1-2 weeks Please obtain BMP/CBC in one week   Home Health:no Equipment/Devices:none  Discharge Condition:Stable CODE STATUS:Full Diet recommendation: Heart Healthy   Brief/Interim Summary:  30 y.o. female past medical history no significant past medical history that comes in for lower extremity weakness and pain. Neurology was consulted recommended an MRI nests or small schwannoma in the lumbar spine.  They also recommended an LP IR has been consulted for an LP    Discharge Diagnoses:  Active Problems:   Lower extremity weakness  Sed rate was 18, pregnancy test was negative, CK was 61 B12 was 296 she was started on intramuscular B12 repletion. In a.m. rheumatoid factors are pending. HIV RPR were negative. 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 They recommended to follow-up with neurosurgery as an outpatient.  There was no need for lumbar puncture. Patient's father came to the bedside and related that they were leaving in 15 minutes the MD responded within 5 minutes via secure setting call the room but nobody was available they had eloped the premises.  Discharge Instructions  Discharge Instructions     Diet - low sodium heart healthy   Complete by: As directed    Increase activity slowly   Complete by: As directed       Allergies as of 01/28/2021       Reactions   Penicillins Swelling, Other (See Comments)   Reaction:  All over body swelling  Has patient had a PCN reaction causing immediate rash, facial/tongue/throat swelling, SOB or lightheadedness  with hypotension: Yes Has patient had a PCN reaction causing severe rash involving mucus membranes or skin necrosis: No Has patient had a PCN reaction that required hospitalization No Has patient had a PCN reaction occurring within the last 10 years: No If all of the above answers are "NO", then may proceed with Cephalosporin use.        Medication List     TAKE these medications    CALCIUM PO Take 1 tablet by mouth daily.   ibuprofen 800 MG tablet Commonly known as: ADVIL Take 800 mg by mouth every 8 (eight) hours as needed for mild pain.   VITAMIN D PO Take 1 tablet by mouth daily.        Follow-up Information     [facility]. Schedule an appointment as soon as possible for a visit in 1 week(s).   Why: 1 week Contact information: [address]     [address] [place] [address] [phone]        Julio Sicks, MD. Schedule an appointment as soon as possible for a visit in 1 week(s).   Specialty: Neurosurgery Why: 1 week Contact information: [address] [place] [address] [phone]                Allergies  Allergen Reactions   Penicillins Swelling and Other (See Comments)    Reaction:  All over body swelling  Has patient had a PCN reaction causing immediate rash, facial/tongue/throat swelling, SOB or lightheadedness with hypotension: Yes Has patient had a  PCN reaction causing severe rash involving mucus membranes or skin necrosis: No Has patient had a PCN reaction that required hospitalization No Has patient had a PCN reaction occurring within the last 10 years: No If all of the above answers are "NO", then may proceed with Cephalosporin use.      Consultations: Neurology   Procedures/Studies: MR CERVICAL SPINE WO CONTRAST  Result Date: 01/28/2021 CLINICAL DATA:  30 year old female with progressive lower extremity symptoms for 3 months. Myelopathy suspected on clinical exam.  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 EXAM: MRI CERVICAL SPINE WITHOUT CONTRAST TECHNIQUE: Multiplanar, multisequence MR imaging of the cervical spine was performed. No intravenous contrast was administered. COMPARISON:  Lumbar MRI 01/27/2021. FINDINGS: Alignment: Straightening of cervical lordosis. No spondylolisthesis. Vertebrae: No marrow edema or evidence of acute osseous abnormality. Visualized bone marrow signal is within normal limits. Cord: Normal. No spinal cord signal abnormality identified. Canal patency is within normal limits. Posterior Fossa, vertebral arteries, paraspinal tissues: Cervicomedullary junction is within normal limits. Negative visible posterior fossa. Preserved major vascular flow voids in the neck. Left vertebral artery appears mildly dominant. Visible neck soft tissues are within normal limits; 7 mm mildly T2 hyperintense left thyroid nodule Not clinically significant; no follow-up imaging recommended (ref: J Am Coll Radiol. 2015 Feb;12(2): 143-50). Disc levels: C2-C3:  Negative. C3-C4: Mild disc desiccation. Borderline to mild foraminal disc bulging and bilateral C4 foraminal stenosis. No spinal stenosis. C4-C5:  Negative. C5-C6:  Negative. C6-C7:  Negative. C7-T1:  Mild facet hypertrophy. No stenosis. Thoracic spine is detailed separately today. IMPRESSION: Essentially normal MRI appearance of the cervical spine. No cervical cord signal abnormality. Minor disc bulging at C3-C4.  No spinal stenosis. Electronically Signed   By: Odessa Fleming M.D.   On: 01/28/2021 09:57   MR THORACIC SPINE WO CONTRAST  Result Date: 01/28/2021 CLINICAL DATA:  30 year old female with progressive lower extremity symptoms for 3 months. Myelopathy suspected on clinical exam. 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 EXAM: MRI THORACIC SPINE  WITHOUT CONTRAST TECHNIQUE: Multiplanar, multisequence MR imaging of the thoracic spine was performed. No intravenous contrast was administered. COMPARISON:  Cervical spine MRI today reported separately. Lumbar MRI yesterday. FINDINGS: Limited cervical spine imaging:  Reported separately today. Thoracic spine segmentation: Thoracic segmentation seems to be normal, but this reveals transitional lumbosacral anatomy, with the lowest ribs at what was designated L1 yesterday. Subsequently, the L5 level is sacralized. Correlation with radiographs is recommended prior to any operative intervention. Alignment: Borderline to mildly exaggerated upper thoracic kyphosis. There is slight levoconvex thoracic scoliosis. No spondylolisthesis. Vertebrae: No marrow edema or evidence of acute osseous abnormality. Visualized bone marrow signal is within normal limits. Cord: Motion artifact on sagittal STIR imaging. Spinal cord signal and morphology remain normal despite some thoracic spine degenerative findings detailed below. Conus medullaris is normal at T12-L1. Paraspinal and other soft tissues: Negative. Disc levels: T1-T2: Negative. T2-T3: Negative. T3-T4: Negative. T4-T5: Negative. T5-T6: Subtle right paracentral disc protrusion (series 28, image 14). No stenosis. T6-T7: Subtle right paracentral disc protrusion (series 27, image 17 and series 24, image 7. No stenosis. T7-T8: Slightly larger central disc protrusion (series 28, image 20) effaces the ventral CSF space. But there is no stenosis. T8-T9: Borderline to mild facet hypertrophy on the left. No stenosis. T9-T10: Negative. T10-T11: Negative. T11-T12: Negative. T12-L1: Negative. IMPRESSION: 1. 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 L5 level appears to be fully sacralized (such that the cauda equina lesion described yesterday is actually at L3-L4). Correlation with radiographs is recommended prior to any operative intervention. 2.  Normal thoracic spinal cord. 3. Small thoracic disc herniations T5-T6 through T7-T8. No significant spinal stenosis or associated neural impingement. Electronically Signed   By: Odessa Fleming M.D.   On: 01/28/2021 10:05   MR LUMBAR SPINE WO CONTRAST  Result Date: 01/27/2021 CLINICAL DATA:  Leg pain over the last 3 months. EXAM: MRI LUMBAR SPINE WITHOUT CONTRAST TECHNIQUE: Multiplanar, multisequence MR imaging of the lumbar spine was performed. No intravenous contrast was administered. COMPARISON:  None. FINDINGS: Segmentation: The lowest lumbar type non-rib-bearing vertebra is labeled as L5. Alignment:  No vertebral subluxation is observed. Vertebrae:  Disc desiccation at L5-S1. Conus medullaris and cauda equina: Conus extends to the lower L2 level, mildly low. Approximately the L4-5 vertebral level, a 0.6 by 0.3 by 0.4 cm intermediate signal intensity lesion is present posteriorly in the midline along the filum terminale believed to be intradural and intramedullary, probably a schwannoma, less likely to be a small ependymoma. Paraspinal and other soft tissues: Unremarkable Disc levels: L1-2: Unremarkable. L2-3: Unremarkable. L3-4: Unremarkable. L4-5: Unremarkable. L5-S1: No impingement. Right paracentral and lateral recess disc protrusion. IMPRESSION: 1. 6 by 3 by 4 mm intradural intramedullary lesion posteriorly along the thecal sac at about the L4-5 level, probably a small schwannoma although ependymoma can also occur along the cauda equina region, as can other much less common lesions. Completion lumbar MRI with contrast is recommended for baseline characterization. 2. Right paracentral and lateral recess disc protrusion at L5-S1 but without overt impingement. Electronically Signed   By: Gaylyn Rong M.D.   On: 01/27/2021 13:51   MR LUMBAR SPINE W CONTRAST  Result Date: 01/27/2021 CLINICAL DATA:  Mass along the cauda equina, for further characterization. EXAM: MRI LUMBAR SPINE WITH CONTRAST TECHNIQUE:  Multiplanar and multiecho pulse sequences of the lumbar spine were obtained with intravenous contrast. CONTRAST:  8.79mL GADAVIST GADOBUTROL 1 MMOL/ML IV SOLN COMPARISON:  Noncontrast MRI from 01/27/2021 FINDINGS: At the L4-5 level, the small posterior mass along the cauda equina diffusely and homogeneously enhances, measuring 0.5 by 0.4 by 0.5 cm on image 8 series 4 and image 29 series 5. Sharp and well-defined border. No additional significant enhancing lesions are identified. No abnormal paraspinal enhancing process. IMPRESSION: 1. 5 by 4 by 5 mm posterior mass in the midline of the thecal sac along the cauda equina at about the L4-5 level posteriorly has diffuse homogeneous sharply defined enhancement. A small schwannoma remains a top differential diagnostic consideration. Electronically Signed   By: Gaylyn Rong M.D.   On: 01/27/2021 17:39   DG Knee Complete 4 Views Left  Result Date: 01/21/2021 CLINICAL DATA:  Chronic bilateral knee pain. EXAM: LEFT KNEE - COMPLETE 4+ VIEW COMPARISON:  None. FINDINGS: No evidence of fracture, dislocation, or joint effusion. No evidence of arthropathy or other focal bone abnormality. Soft tissues are unremarkable. IMPRESSION: Negative. Electronically Signed   By: Elberta Fortis M.D.   On: 01/21/2021 12:59   DG Knee Complete 4 Views Right  Result Date: 01/21/2021 CLINICAL DATA:  Chronic bilateral knee pain. EXAM: RIGHT KNEE - COMPLETE 4+ VIEW COMPARISON:  None. FINDINGS: No evidence of fracture, dislocation, or joint effusion. No evidence of arthropathy or other focal bone abnormality. Soft tissues are unremarkable. IMPRESSION: Negative. Electronically Signed   By: Elberta Fortis  M.D.   On: 01/21/2021 12:59     Subjective: No complaint  Discharge Exam: Vitals:   01/28/21 0016 01/28/21 0617  BP: 110/67 112/63  Pulse: 84 77  Resp:    Temp: 99 F (37.2 C) 99 F (37.2 C)  SpO2: 100% 100%   Vitals:   01/27/21 1915 01/27/21 2027 01/28/21 0016 01/28/21  0617  BP: 111/69 112/72 110/67 112/63  Pulse: 80 84 84 77  Resp: 19 18    Temp:  98.2 F (36.8 C) 99 F (37.2 C) 99 F (37.2 C)  TempSrc:   Oral Oral  SpO2: 100% 100% 100% 100%    General: Pt is alert, awake, not in acute distress Cardiovascular: RRR, S1/S2 +, no rubs, no gallops Respiratory: CTA bilaterally, no wheezing, no rhonchi Abdominal: Soft, NT, ND, bowel sounds + Extremities: no edema, no cyanosis    The results of significant diagnostics from this hospitalization (including imaging, microbiology, ancillary and laboratory) are listed below for reference.     Microbiology: Recent Results (from the past 240 hour(s))  Resp Panel by RT-PCR (Flu A&B, Covid) Nasopharyngeal Swab     Status: None   Collection Time: 01/27/21  7:17 PM   Specimen: Nasopharyngeal Swab; Nasopharyngeal(NP) swabs in vial transport medium  Result Value Ref Range Status   SARS Coronavirus 2 by RT PCR NEGATIVE NEGATIVE Final    Comment: (NOTE) SARS-CoV-2 target nucleic acids are NOT DETECTED.  The SARS-CoV-2 RNA is generally detectable in upper respiratory specimens during the acute phase of infection. The lowest concentration of SARS-CoV-2 viral copies this assay can detect is 138 copies/mL. A negative result does not preclude SARS-Cov-2 infection and should not be used as the sole basis for treatment or other patient management decisions. A negative result may occur with  improper specimen collection/handling, submission of specimen other than nasopharyngeal swab, presence of viral mutation(s) within the areas targeted by this assay, and inadequate number of viral copies(<138 copies/mL). A negative result must be combined with clinical observations, patient history, and epidemiological information. The expected result is Negative.  Fact Sheet for Patients:  BloggerCourse.com  Fact Sheet for Healthcare Providers:  SeriousBroker.it  This test  is no t yet approved or cleared by the [place] FDA and  has been authorized for detection and/or diagnosis of SARS-CoV-2 by FDA under an Emergency Use Authorization (EUA). This EUA will remain  in effect (meaning this test can be used) for the duration of the COVID-19 declaration under Section 564(b)(1) of the Act, 21 U.S.C.section 360bbb-3(b)(1), unless the authorization is terminated  or revoked sooner.       Influenza A by PCR NEGATIVE NEGATIVE Final   Influenza B by PCR NEGATIVE NEGATIVE Final    Comment: (NOTE) The Xpert Xpress SARS-CoV-2/FLU/RSV plus assay is intended as an aid in the diagnosis of influenza from Nasopharyngeal swab specimens and should not be used as a sole basis for treatment. Nasal washings and aspirates are unacceptable for Xpert Xpress SARS-CoV-2/FLU/RSV testing.  Fact Sheet for Patients: BloggerCourse.com  Fact Sheet for Healthcare Providers: SeriousBroker.it  This test is not yet approved or cleared by the [place] FDA and has been authorized for detection and/or diagnosis of SARS-CoV-2 by FDA under an Emergency Use Authorization (EUA). This EUA will remain in effect (meaning this test can be used) for the duration of the COVID-19 declaration under Section 564(b)(1) of the Act, 21 U.S.C. section 360bbb-3(b)(1), unless the authorization is terminated or revoked.  Performed at [facility], [address] [place], [place] [address]      Labs: BNP (last 3 results) No results for input(s): BNP in the last 8760 hours. Basic Metabolic Panel: Recent Labs  Lab 01/27/21 1203 01/27/21 2208 01/28/21 0345  NA 138  --  136  K 4.2  --  4.4  CL 106  --  107  CO2 25  --  25  GLUCOSE 79  --  96  BUN 11  --  13  CREATININE 0.66 0.74 0.68  CALCIUM 9.6  --  9.0   Liver Function Tests: Recent Labs  Lab 01/27/21 1203 01/28/21 0345  AST 16 14*  ALT 14 15  ALKPHOS 33* 27*  BILITOT  0.6 0.5  PROT 7.2 6.9  ALBUMIN 3.7 3.4*   No results for input(s): LIPASE, AMYLASE in the last 168 hours. No results for input(s): AMMONIA in the last 168 hours. CBC: Recent Labs  Lab 01/24/21 1057 01/27/21 1203 01/27/21 2208  WBC 5.6 5.7 7.2  NEUTROABS  --  3.2  --   HGB 13.1 12.1 12.1  HCT 41.8 38.2 38.6  MCV 70* 69.7* 69.8*  PLT 314 262 261   Cardiac Enzymes: Recent Labs  Lab 01/27/21 2208  CKTOTAL 61   BNP: Invalid input(s): POCBNP CBG: No results for input(s): GLUCAP in the last 168 hours. D-Dimer No results for input(s): DDIMER in the last 72 hours. Hgb A1c No results for input(s): HGBA1C in the last 72 hours. Lipid Profile No results for input(s): CHOL, HDL, LDLCALC, TRIG, CHOLHDL, LDLDIRECT in the last 72 hours. Thyroid function studies No results for input(s): TSH, T4TOTAL, T3FREE, THYROIDAB in the last 72 hours.  Invalid input(s): FREET3 Anemia work up Recent Labs    01/27/21 1203 01/27/21 2208  VITAMINB12 301 296   Urinalysis    Component Value Date/Time   COLORURINE YELLOW 02/23/2016 1110   APPEARANCEUR CLEAR 02/23/2016 1110   LABSPEC 1.025 09/01/2017 1159   PHURINE 7.0 09/01/2017 1159   GLUCOSEU NEGATIVE 09/01/2017 1159   HGBUR TRACE (A) 09/01/2017 1159   BILIRUBINUR negative 12/14/2018 0951   KETONESUR NEGATIVE 09/01/2017 1159   PROTEINUR Negative 12/14/2018 0951   PROTEINUR NEGATIVE 09/01/2017 1159   UROBILINOGEN 0.2 12/14/2018 0951   UROBILINOGEN 1.0 09/01/2017 1159   NITRITE negative 12/14/2018 0951   NITRITE NEGATIVE 09/01/2017 1159   LEUKOCYTESUR Negative 12/14/2018 0951   Sepsis Labs Invalid input(s): PROCALCITONIN,  WBC,  LACTICIDVEN Microbiology Recent Results (from the past 240 hour(s))  Resp Panel by RT-PCR (Flu A&B, Covid) Nasopharyngeal Swab     Status: None   Collection Time: 01/27/21  7:17 PM   Specimen: Nasopharyngeal Swab; Nasopharyngeal(NP) swabs in vial transport medium  Result Value Ref Range Status   SARS  Coronavirus 2 by RT PCR NEGATIVE NEGATIVE Final    Comment: (NOTE) SARS-CoV-2 target nucleic acids are NOT DETECTED.  The SARS-CoV-2 RNA is generally detectable in upper respiratory specimens during the acute phase of infection. The lowest concentration of SARS-CoV-2 viral copies this assay can detect is 138 copies/mL. A negative result does not preclude SARS-Cov-2 infection and should not be used as the sole basis for treatment or other patient management decisions. A negative result may occur with  improper specimen collection/handling, submission of specimen other than nasopharyngeal swab, presence of viral mutation(s) within the areas targeted by this assay, and inadequate number of viral copies(<138 copies/mL). A negative result must be combined with clinical observations, patient history, and epidemiological information. The expected result is Negative.  Fact  Sheet for Patients:  BloggerCourse.com  Fact Sheet for Healthcare Providers:  SeriousBroker.it  This test is no t yet approved or cleared by the [place] FDA and  has been authorized for detection and/or diagnosis of SARS-CoV-2 by FDA under an Emergency Use Authorization (EUA). This EUA will remain  in effect (meaning this test can be used) for the duration of the COVID-19 declaration under Section 564(b)(1) of the Act, 21 U.S.C.section 360bbb-3(b)(1), unless the authorization is terminated  or revoked sooner.       Influenza A by PCR NEGATIVE NEGATIVE Final   Influenza B by PCR NEGATIVE NEGATIVE Final    Comment: (NOTE) The Xpert Xpress SARS-CoV-2/FLU/RSV plus assay is intended as an aid in the diagnosis of influenza from Nasopharyngeal swab specimens and should not be used as a sole basis for treatment. Nasal washings and aspirates are unacceptable for Xpert Xpress SARS-CoV-2/FLU/RSV testing.  Fact Sheet for  Patients: BloggerCourse.com  Fact Sheet for Healthcare Providers: SeriousBroker.it  This test is not yet approved or cleared by the [place] FDA and has been authorized for detection and/or diagnosis of SARS-CoV-2 by FDA under an Emergency Use Authorization (EUA). This EUA will remain in effect (meaning this test can be used) for the duration of the COVID-19 declaration under Section 564(b)(1) of the Act, 21 U.S.C. section 360bbb-3(b)(1), unless the authorization is terminated or revoked.  Performed at [facility], [address] [place], [place] [address]    SIGNED:   Marinda Elk, MD  [facility] 01/29/2021, 10:43 AM Pager   If 7PM-7AM, please contact night-coverage [email or web address] Password [email or web address]

## 2021-01-31 LAB — METHYLMALONIC ACID, SERUM: Methylmalonic Acid, Quantitative: 138 nmol/L (ref 0–378)

## 2021-02-01 ENCOUNTER — Ambulatory Visit: Admission: RE | Admit: 2021-02-01 | Payer: Medicaid Other | Source: Ambulatory Visit

## 2021-02-05 ENCOUNTER — Other Ambulatory Visit: Payer: Self-pay

## 2021-02-05 ENCOUNTER — Ambulatory Visit
Admission: RE | Admit: 2021-02-05 | Discharge: 2021-02-05 | Disposition: A | Discharge status: Home / Self Care | Payer: Medicaid Other | Source: Ambulatory Visit | Attending: Women's Health | Admitting: Women's Health

## 2021-02-05 DIAGNOSIS — N939 Abnormal uterine and vaginal bleeding, unspecified: Secondary | ICD-10-CM | POA: Diagnosis not present

## 2021-02-05 IMAGING — US US PELVIS COMPLETE WITH TRANSVAGINAL
1 series · 15 of 25 positions shown · non-contrast
Comparison: None

CLINICAL DATA: Initial evaluation for abnormal uterine bleeding.



[Series 1: us pelvis complete with transvaginal · 15 of 103 slices shown]
[im 1/103]
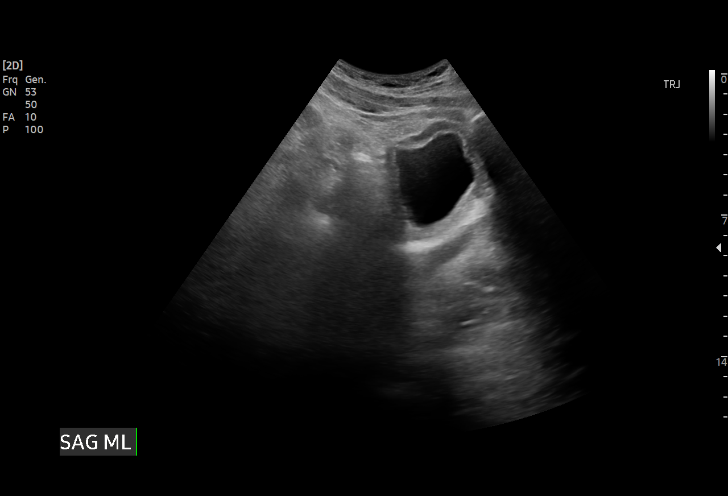
[im 9/103]
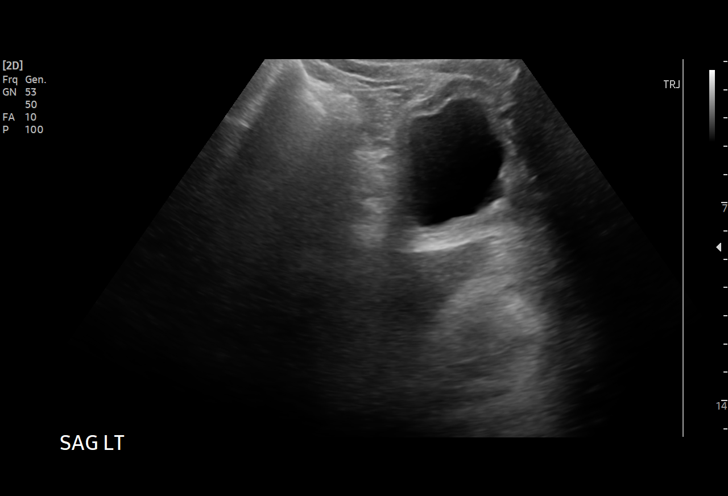
[im 18/103]
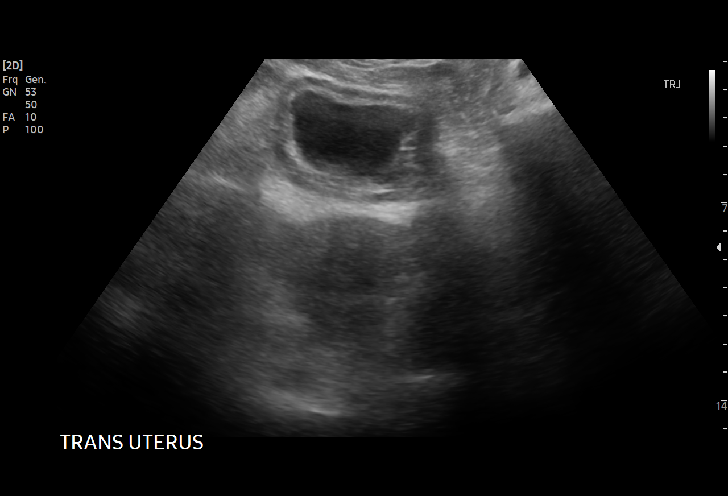
[im 22/103]
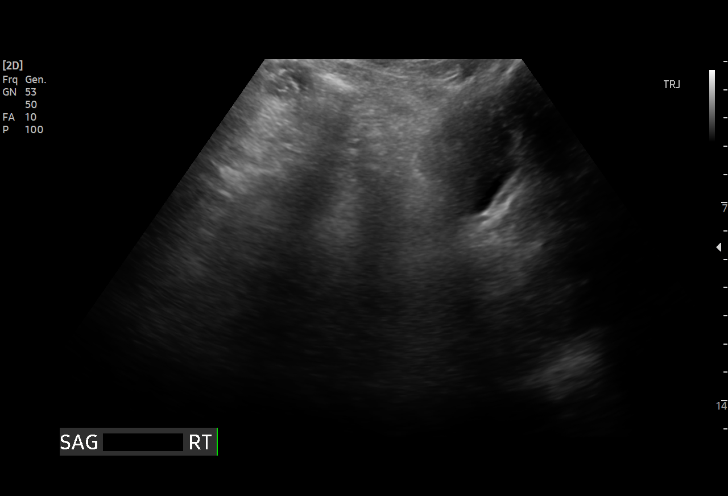
[im 30/103]
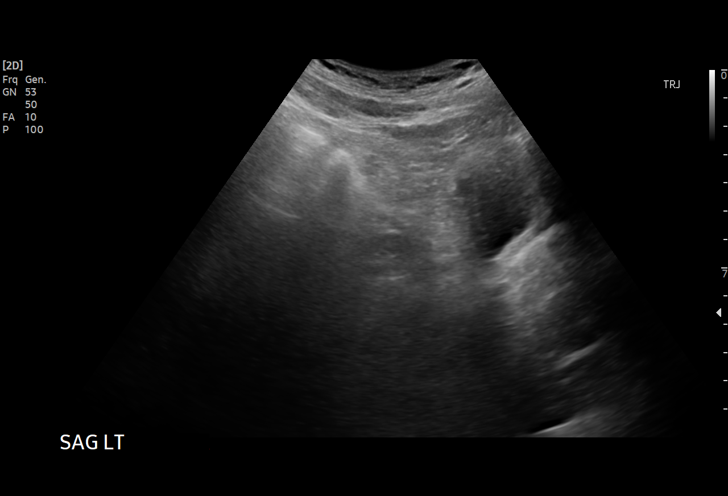
[im 39/103]
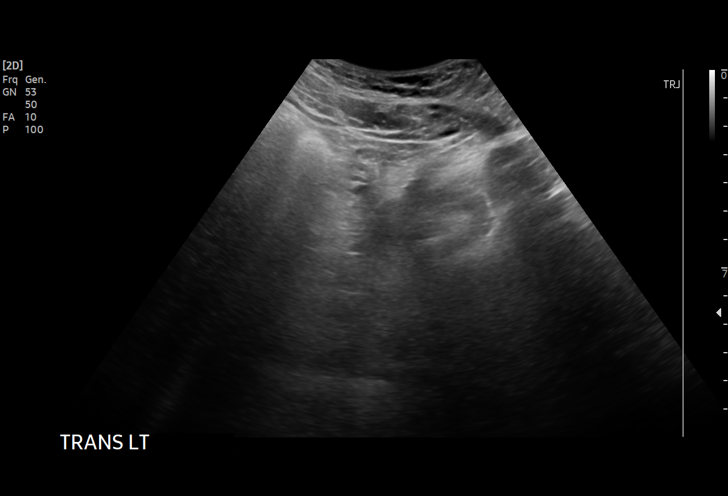
[im 43/103]
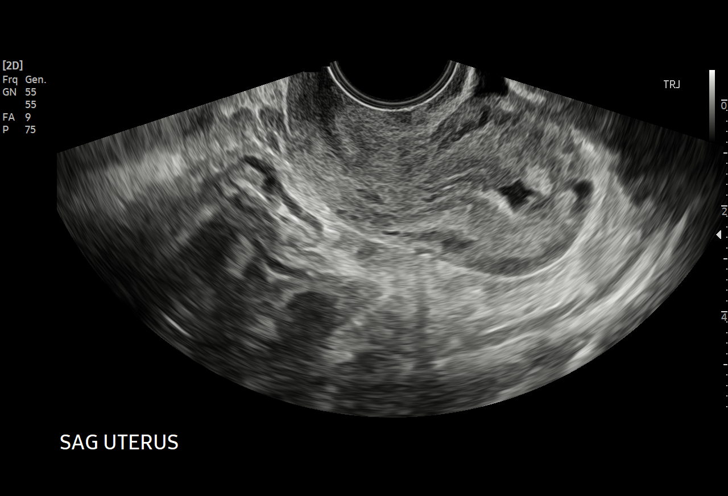
[im 52/103]
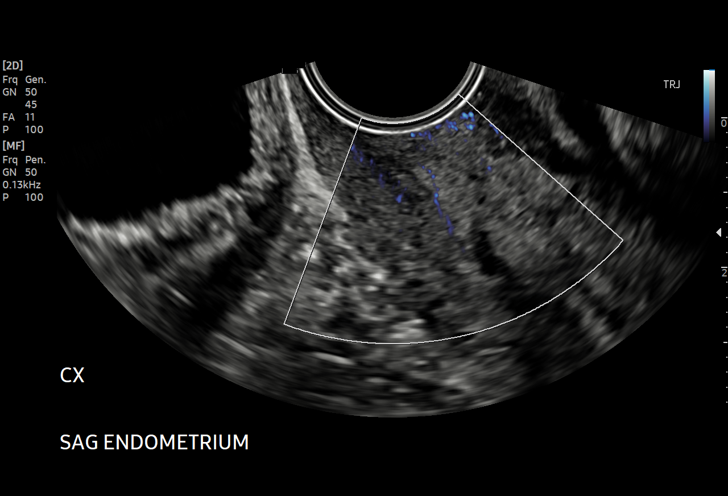
[im 60/103]
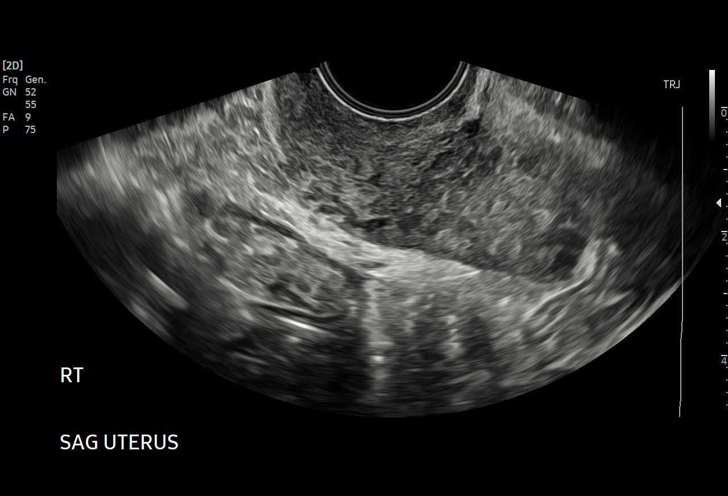
[im 64/103]
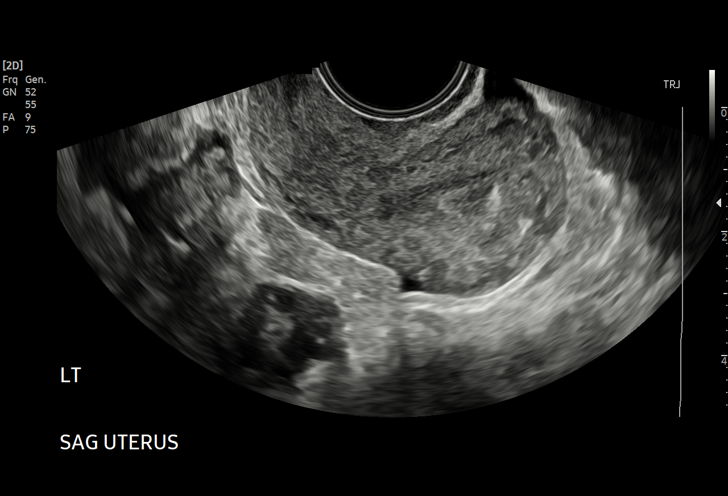
[im 73/103]
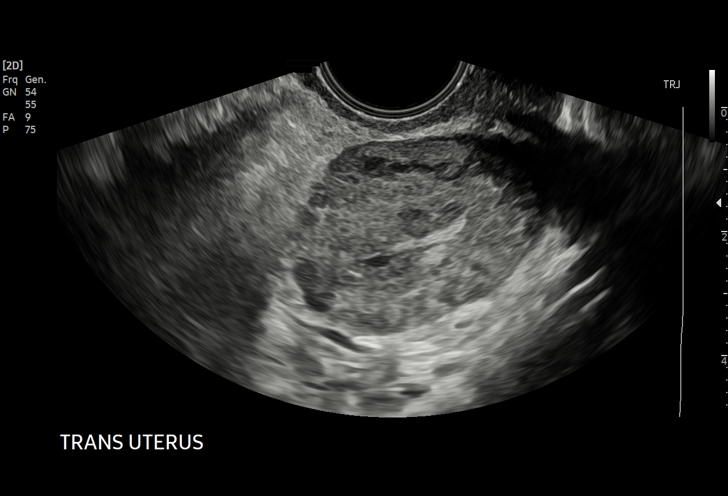
[im 81/103]
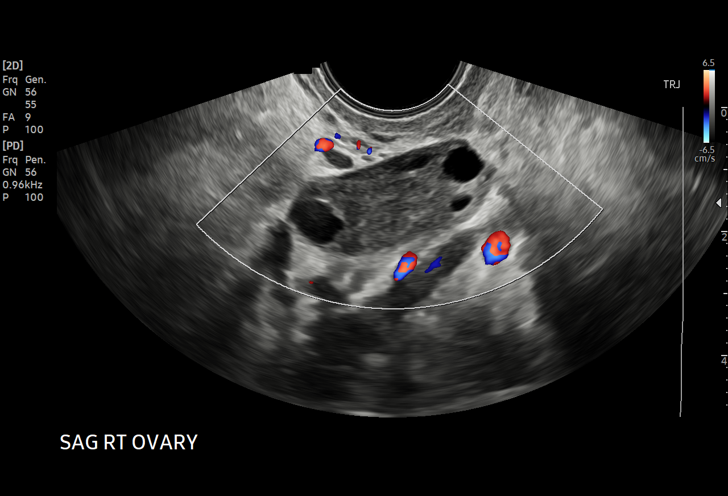
[im 86/103]
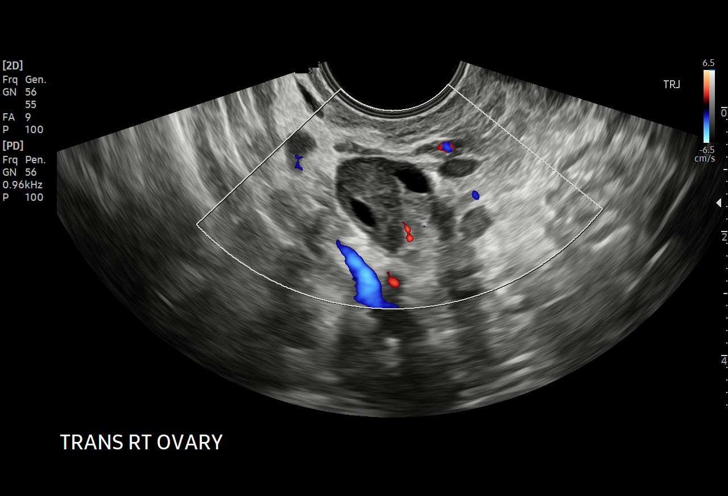
[im 94/103]
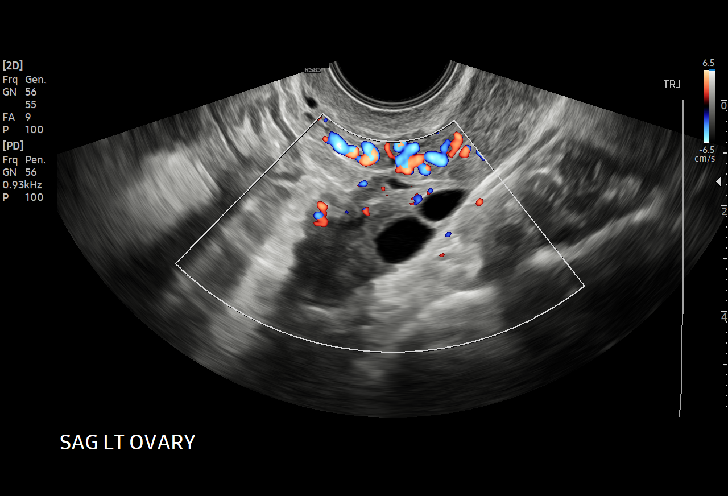
[im 103/103]
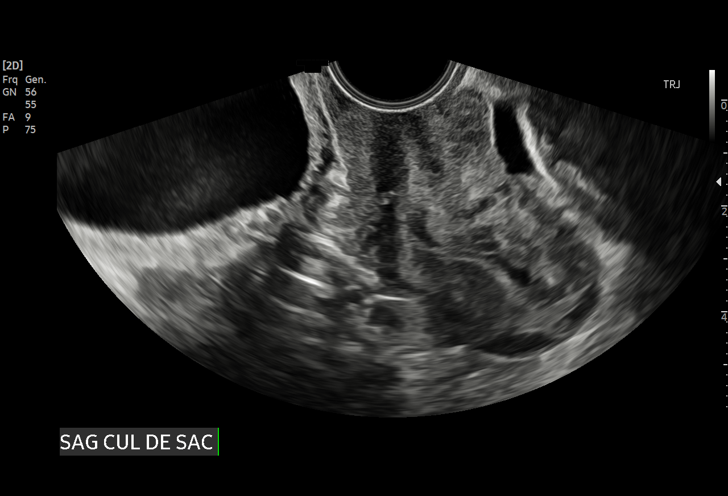

[15 of 25 positions shown; findings below may reference images not displayed]

FINDINGS: Uterus

Measurements: 6.7 x 3.4 x 4.6 cm = volume: 55.1 mL. Uterus is
retroverted. No discrete fibroid or other mass.

Endometrium

Thickness: 9.6 mm. Heterogeneous echotexture with question of
superimposed echogenic lesion measuring 12 x 6 x 10 mm (image 49).
Suggestion of associated vascularity. Small amount of simple fluid
noted within the adjacent endometrial cavity.

Right ovary

Measurements: 3.5 x 1.4 x 1.8 cm = volume: 4.9 mL. Normal
appearance/no adnexal mass.

Left ovary

Measurements: 3.7 x 2.0 x 1.9 cm = volume: 10.6 mL. Normal
appearance/no adnexal mass.

Other findings

Trace free fluid seen within the pelvis.
IMPRESSION: 1. 12 x 6 x 10 mm echogenic lesion within the endometrial cavity,
indeterminate, but could reflect a endometrial polyp. Consider
further evaluation with sonohysterogram for confirmation prior to
hysteroscopy. Endometrial sampling should also be considered if
patient is at high risk for endometrial carcinoma. (Ref:
Radiological Reasoning: Algorithmic Workup of Abnormal Vaginal
Bleeding with Endovaginal Sonography and Sonohysterography. AJR
[GT]; [id number]).
2. Otherwise unremarkable and normal pelvic ultrasound. No other
pelvic or adnexal mass.

## 2021-02-06 ENCOUNTER — Other Ambulatory Visit: Payer: Self-pay

## 2021-02-06 ENCOUNTER — Ambulatory Visit: Payer: Medicaid Other | Attending: Orthopaedic Surgery | Admitting: Physical Therapy

## 2021-02-06 ENCOUNTER — Encounter: Payer: Self-pay | Admitting: Physical Therapy

## 2021-02-06 DIAGNOSIS — R2689 Other abnormalities of gait and mobility: Secondary | ICD-10-CM | POA: Insufficient documentation

## 2021-02-06 DIAGNOSIS — M6281 Muscle weakness (generalized): Secondary | ICD-10-CM | POA: Diagnosis present

## 2021-02-06 DIAGNOSIS — M25561 Pain in right knee: Secondary | ICD-10-CM | POA: Diagnosis not present

## 2021-02-06 DIAGNOSIS — M25562 Pain in left knee: Secondary | ICD-10-CM | POA: Insufficient documentation

## 2021-02-06 NOTE — Therapy (Signed)
[facility] Outpatient Rehabilitation [facility] [address] [place], [place], [address] Phone: [phone]   Fax:  [phone]  Physical Therapy Evaluation  Patient Details  Name: Katelyn Bauer MRN: [id number] Date of Birth: 09-10-1990 Referring Provider (PT): Dr.Steven Steward Drone   Encounter Date: 02/06/2021   PT End of Session - 02/06/21 0838     Visit Number 1    Number of Visits 12    Date for PT Re-Evaluation 04/03/21    Authorization Type [employer]    PT Start Time 0800    PT Stop Time 0845    PT Time Calculation (min) 45 min    Activity Tolerance Patient tolerated treatment well    Behavior During Therapy [facility] for tasks assessed/performed             Past Medical History:  Diagnosis Date   Low bone density    Medical history non-contributory     Past Surgical History:  Procedure Laterality Date   NO PAST SURGERIES      There were no vitals filed for this visit.    Subjective Assessment - 02/06/21 0809     Subjective Pt was discharged from the boot camp due to osteopenia.  I am alot slower. I get things done but, slowly. She has difficulty walking, standing.  She walks with crutches due to weakness and balance issues.  She started having problems with her feet, legs.  XR was normal. She had a bone density test and showed osteopenia.  They think with me being on Depo may have caused it. She used to have numbness and tingling in her feet (Sept.).  That got better after she was DC (Oct 3 ).  She feels strength symmetrically , She has not fallen but does sense a trembling her legs.    Pertinent History none pertinent    Limitations Sitting;Walking;Lifting;House hold activities;Standing    How long can you sit comfortably? pain in unsupported sitting    How long can you stand comfortably? 5-10 min    How long can you walk comfortably? 5-10 min    Diagnostic tests MRI of cervical, thoracic and lumbar spine.  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 T spine:Small thoracic disc herniations T5-T6 through T7-T8. No  significant spinal stenosis or associated neural impingement.Cervical:Minor disc bulging at C3-C4.  No spinal stenosis.    Patient Stated Goals Walk without crutches    Currently in Pain? Yes    Pain Score 4     Pain Location Back    Pain Orientation Lower    Pain Descriptors / Indicators Sharp;Sore;Pressure    Pain Type Chronic pain    Pain Onset More than a month ago    Pain Frequency Constant    Aggravating Factors  standing, walking, sitting unsupported    Pain Relieving Factors laying down                [facility] PT Assessment - 02/06/21 0001       Assessment   Medical Diagnosis LE weakness, knee pain bilateral    Referring Provider (PT) Dr.Steven Steward Drone    Onset Date/Surgical Date --   3 mos   Next MD Visit unknown    Prior Therapy No      Precautions   Precautions None    Precaution Comments progressive weakness      Restrictions   Weight Bearing Restrictions No      Balance Screen  Has the patient fallen in the past 6 months No      Home Environment   Living Environment Private residence    Living Arrangements Parent    Home Access Level entry    Home Layout Multi-level    Alternate Level Stairs-Number of Steps 12    Alternate Level Stairs-Rails Can reach both    Home Equipment Crutches    Additional Comments lives with father and her 2 kids      Prior Function   Level of Independence Independent    Clinical research associate classes accounting    Leisure "I have no life" , has 2 small kids,  friends, church      Cognition   Overall Cognitive Status Within Functional Limits for tasks assessed      Observation/Other Assessments   Focus on Therapeutic Outcomes (FOTO)  NT due to MCD      Sensation   Light Touch Appears Intact      Coordination   Gross Motor Movements are Fluid and Coordinated No     Coordination and Movement Description with gait, poor proprioception and glute activation      Posture/Postural Control   Posture Comments hip ER with gait, genu valgus      ROM / Strength   AROM / PROM / Strength AROM      AROM   Overall AROM  Within functional limits for tasks performed    Overall AROM Comments in knees, hips and ankles    Lumbar Flexion WFL no pain    Lumbar Extension 50% pain in back      Strength   Right Hip Flexion 4+/5    Right Hip Extension 3+/5    Right Hip ABduction 4-/5    Left Hip Flexion 4+/5    Left Hip Extension 3+/5    Left Hip ABduction 4-/5    Right Knee Flexion 4+/5    Right Knee Extension 4+/5    Left Knee Flexion 4+/5    Left Knee Extension 4/5    Right Ankle Dorsiflexion 4+/5    Left Ankle Dorsiflexion 4/5      Palpation   Palpation comment No knee pain with palpation      Transfers   Five time sit to stand comments  20.9 sec, used hands on thighs      Ambulation/Gait   Ambulation Distance (Feet) 150 Feet    Gait Pattern Decreased hip/knee flexion - right;Decreased hip/knee flexion - left;Decreased dorsiflexion - right;Decreased dorsiflexion - left;Lateral hip instability;Wide base of support    Ambulation Surface Level;Indoor                   Objective measurements completed on examination: See above findings.     PT Education - 02/06/21 0854     Education Details PT/POC, HEP, neuro consult    Person(s) Educated Patient    Methods Explanation    Comprehension Verbalized understanding;Returned demonstration;Need further instruction              PT Short Term Goals - 02/06/21 0855       PT SHORT TERM GOAL #1   Title Pt will be I with initial HEP    Time 4    Period Weeks    Status New    Target Date 03/06/21      PT SHORT TERM GOAL #2   Title Pt will be able to walk consistently without crutches and a gait speed  Time 4    Period Weeks    Status New    Target Date 03/06/21      PT SHORT TERM  GOAL #3   Title Pt will be able to complete 2 min walk test and set goal.    Time 2    Period Weeks    Status New    Target Date 02/20/21               PT Long Term Goals - 02/06/21 0913       PT LONG TERM GOAL #1   Title Pt will be able to show independence with HEP for long term strength and conditioning    Baseline given inital on eval, unknown to pt    Time 8    Period Weeks    Status New    Target Date 04/03/21      PT LONG TERM GOAL #2   Title Pt will be able to to walk for 30 min in the community without knee or back pain    Baseline 5-10 min    Time 4    Period Weeks    Status New    Target Date 04/03/21      PT LONG TERM GOAL #3   Title Pt will be able to negotiate stairs 12 + in her home without the use of rails    Baseline relies heaviily on rails    Time 8    Period Weeks    Status New    Target Date 04/03/21      PT LONG TERM GOAL #4   Title Pt will report no pain in hips, back with standing, ADLs > 15 min    Baseline pain can be moderate to severe at times    Time 8    Period Weeks    Status New    Target Date 04/03/21                    Plan - 02/06/21 1217     Clinical Impression Statement Patient presents for high complexity eval of bilateral LE weakness with gait instability. She presents with gradual worsening weakness for 3 + months.  While in [place] camp she was discharged with diagnosis of osteopenia. Her T score is 0.4, however, which is normal.  She has intermittent but chronic back and knee pain . Her gait is slow, poor coordination and weakness evident in hips, core. She walks with a gluteus maximus lurch.  Given the mass found in cauda equina, recommend workup.   She denies red flags but progressive worsening of strength is concerning for Neuro involvement, Myelopathy.  Will benefit from Neuro workup/consult.  Will see in PT to develop strength and balance but progress may be limited due to non-ortho issue.    Personal Factors  and Comorbidities Time since onset of injury/illness/exacerbation    Examination-Activity Limitations Stairs;Stand;Transfers;Locomotion Level;Squat    Examination-Participation Restrictions Community Activity;Occupation;School;Interpersonal Relationship;Cleaning;Church;Shop    Stability/Clinical Decision Making Unstable/Unpredictable    Clinical Decision Making High    Rehab Potential Good    PT Frequency 2x / week    PT Duration 8 weeks    PT Treatment/Interventions ADLs/Self Care Home Management;Neuromuscular re-education;Therapeutic activities;Functional mobility training;Balance training;Patient/family education;Therapeutic exercise;Manual techniques;Gait training;DME Instruction    PT Next Visit Plan check HEP, strength, gait and balance    PT Home Exercise Plan sit to stand, bridging, SLR    Recommended Other Services NEUROLOGY    Consulted and  Agree with Plan of Care Patient             Patient will benefit from skilled therapeutic intervention in order to improve the following deficits and impairments:  Abnormal gait, Decreased strength, Decreased coordination, Decreased balance, Decreased mobility, Difficulty walking, Pain  Visit Diagnosis: Muscle weakness (generalized)  Other abnormalities of gait and mobility     Problem List Patient Active Problem List   Diagnosis Date Noted   Lower extremity weakness 01/27/2021   Vitamin D deficiency 04/16/2017   Elevated hemoglobin A1c 04/16/2017   GDM (gestational diabetes mellitus), class A1 02/19/2016    Korinna Tat, PT 02/06/2021, 1:43 PM  [facility] [address] [place], [place], [address] Phone: [phone]   Fax:  [phone]  Name: Katelyn Bauer MRN: [id number] Date of Birth: Nov 03, 1990   Check all possible CPT codes: 97110- Therapeutic Exercise, [id number]- Neuro Re-education, [phone] - Gait Training, [phone] - Manual Therapy, [phone] - Therapeutic Activities, [phone] -  Self Care, and [phone] - Physical performance training  Karie Mainland, PT 02/06/21 1:43 PM Phone: [phone] Fax: [phone]

## 2021-02-06 NOTE — Patient Instructions (Signed)
Access Code: [email or web address] URL: [email or web address] Date: 02/06/2021 Prepared by: Karie Mainland  Exercises Supine Active Straight Leg Raise - 2-3 x daily - 7 x weekly - 2 sets - 10 reps - 5 hold Supine Bridge - 2-3 x daily - 7 x weekly - 2 sets - 10 reps - 5 hold Sit to Stand - 2-3 x daily - 7 x weekly - 2 sets - 10 reps - 5 hold

## 2021-02-07 NOTE — Progress Notes (Signed)
Consulted with Dr. Macon Large about [place] and lab results, MyChart message sent to patient.  Marylen Ponto, NP  9:25 AM 02/07/2021

## 2021-02-12 ENCOUNTER — Other Ambulatory Visit: Payer: Self-pay

## 2021-02-12 ENCOUNTER — Encounter: Payer: Self-pay | Admitting: Physical Therapy

## 2021-02-12 ENCOUNTER — Ambulatory Visit: Payer: Medicaid Other | Admitting: Physical Therapy

## 2021-02-12 DIAGNOSIS — M6281 Muscle weakness (generalized): Secondary | ICD-10-CM | POA: Diagnosis not present

## 2021-02-12 DIAGNOSIS — R2689 Other abnormalities of gait and mobility: Secondary | ICD-10-CM

## 2021-02-12 NOTE — Therapy (Signed)
[facility] Outpatient Rehabilitation [facility] [address] [place], [place], [address] Phone: [phone]   Fax:  [phone]  Physical Therapy Treatment  Patient Details  Name: Katelyn Bauer MRN: [id number] Date of Birth: 1990/05/13 Referring Provider (PT): Dr.Steven Steward Drone   Encounter Date: 02/12/2021   PT End of Session - 02/12/21 1022     Visit Number 2    Number of Visits 12    Date for PT Re-Evaluation 04/03/21    Authorization Type [employer]    PT Start Time 1018    PT Stop Time 1106    PT Time Calculation (min) 48 min    Activity Tolerance Patient tolerated treatment well    Behavior During Therapy [facility] for tasks assessed/performed             Past Medical History:  Diagnosis Date   Low bone density    Medical history non-contributory     Past Surgical History:  Procedure Laterality Date   NO PAST SURGERIES      There were no vitals filed for this visit.   Subjective Assessment - 02/12/21 1019     Subjective Patient has min pain in hips when she walks, 1/10. Told her to call Neuro as directed by MD. Knee pops at times.    Diagnostic tests MRI of cervical, thoracic and lumbar spine.  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 T spine:Small thoracic disc herniations T5-T6 through T7-T8. No  significant spinal stenosis or associated neural impingement.Cervical:Minor disc bulging at C3-C4.  No spinal stenosis.    Currently in Pain? Yes    Pain Score 1     Pain Location Hip    Pain Orientation Right;Left;Lateral    Pain Descriptors / Indicators Sore    Pain Type Chronic pain    Pain Onset More than a month ago    Pain Frequency Intermittent    Aggravating Factors  standing, walking    Pain Relieving Factors rest    Effect of Pain on Daily Activities liimits ability to be active    Multiple Pain Sites No                [facility] PT Assessment - 02/12/21 0001       Berg Balance  Test   Sit to Stand Able to stand without using hands and stabilize independently    Standing Unsupported Able to stand safely 2 minutes   shaky in legs , tired   Sitting with Back Unsupported but Feet Supported on Floor or Stool Able to sit safely and securely 2 minutes    Stand to Sit Sits safely with minimal use of hands    Transfers Able to transfer safely, definite need of hands    Standing Unsupported with Eyes Closed Able to stand 10 seconds safely    Standing Unsupported with Feet Together Able to place feet together independently and stand for 1 minute with supervision   not comfortable, tired   From Standing, Reach Forward with Outstretched Arm Can reach forward >12 cm safely (5")   cues to flex at hips   From Standing Position, Pick up Object from Floor Able to pick up shoe, needs supervision   needed cues to move her feet closer   From Standing Position, Turn to Look Behind Over each Shoulder Looks behind from both sides and weight shifts well    Turn 360 Degrees Able to turn 360 degrees safely in 4  seconds or less    Standing Unsupported, Alternately Place Feet on Step/Stool Able to stand independently and safely and complete 8 steps in 20 seconds    Standing Unsupported, One Foot in Front Able to place foot tandem independently and hold 30 seconds    Standing on One Leg Able to lift leg independently and hold > 10 seconds    Total Score 52    Berg comment: 52/56 low to mod fall risk           [facility] Adult PT Treatment/Exercise:   Therapeutic Exercise: NuStep L5 in UE and LE for 5 min Supine bridging x 10  SLR x 10 toes up, increased time and effort  Hip abduction x 10    2 min walk test: 233 feet no device.   Manual Therapy:   Neuromuscular re-ed: N/A   Therapeutic Activity: N/A   Modalities: N/A   Self Care: See pt education Review reports for past imaging    Consider / progression for next session:           PT Education - 02/12/21 1546      Education Details schwannoma, ortho vs neuro weakness, review XR and MRI and bone scan reports    Person(s) Educated Patient    Methods Explanation    Comprehension Verbalized understanding              PT Short Term Goals - 02/06/21 0855       PT SHORT TERM GOAL #1   Title Pt will be I with initial HEP    Time 4    Period Weeks    Status New    Target Date 03/06/21      PT SHORT TERM GOAL #2   Title Pt will be able to walk consistently without crutches and a gait speed    Time 4    Period Weeks    Status New    Target Date 03/06/21      PT SHORT TERM GOAL #3   Title Pt will be able to complete 2 min walk test and set goal.    Time 2    Period Weeks    Status New    Target Date 02/20/21               PT Long Term Goals - 02/06/21 0913       PT LONG TERM GOAL #1   Title Pt will be able to show independence with HEP for long term strength and conditioning    Baseline given inital on eval, unknown to pt    Time 8    Period Weeks    Status New    Target Date 04/03/21      PT LONG TERM GOAL #2   Title Pt will be able to to walk for 30 min in the community without knee or back pain    Baseline 5-10 min    Time 4    Period Weeks    Status New    Target Date 04/03/21      PT LONG TERM GOAL #3   Title Pt will be able to negotiate stairs 12 + in her home without the use of rails    Baseline relies heaviily on rails    Time 8    Period Weeks    Status New    Target Date 04/03/21      PT LONG TERM GOAL #4   Title Pt will report no pain  in hips, back with standing, ADLs > 15 min    Baseline pain can be moderate to severe at times    Time 8    Period Weeks    Status New    Target Date 04/03/21                   Plan - 02/12/21 1023     Clinical Impression Statement [place] attained for balance and 2 min walk test.  She showed decent balance,limited by fatigue in LEs.  She felt like she was going to fall and her legs "giving out" after  about 1 min walking without device.  She was highly challenged by simple hip exercises. Advised her to follow up with Neuro and MRI findings.    PT Treatment/Interventions ADLs/Self Care Home Management;Neuromuscular re-education;Therapeutic activities;Functional mobility training;Balance training;Patient/family education;Therapeutic exercise;Manual techniques;Gait training;DME Instruction    PT Next Visit Plan advance HEP:  strength, gait , endurance    PT Home Exercise Plan sit to stand, bridging, SLR    Consulted and Agree with Plan of Care Patient             Patient will benefit from skilled therapeutic intervention in order to improve the following deficits and impairments:  Abnormal gait, Decreased strength, Decreased coordination, Decreased balance, Decreased mobility, Difficulty walking, Pain  Visit Diagnosis: Muscle weakness (generalized)  Other abnormalities of gait and mobility     Problem List Patient Active Problem List   Diagnosis Date Noted   Lower extremity weakness 01/27/2021   Vitamin D deficiency 04/16/2017   Elevated hemoglobin A1c 04/16/2017   GDM (gestational diabetes mellitus), class A1 02/19/2016    Zuriel Roskos, PT 02/12/2021, 3:50 PM  [facility] Outpatient Rehabilitation [facility] [address] [place], [place], [address] Phone: [phone]   Fax:  [phone]  Name: Katelyn Bauer MRN: [id number] Date of Birth: [birth year]  Karie Mainland, PT 02/12/21 3:51 PM Phone: [phone] Fax: [phone]

## 2021-02-14 ENCOUNTER — Ambulatory Visit: Payer: Medicaid Other

## 2021-02-14 ENCOUNTER — Other Ambulatory Visit: Payer: Self-pay

## 2021-02-14 DIAGNOSIS — R2689 Other abnormalities of gait and mobility: Secondary | ICD-10-CM

## 2021-02-14 DIAGNOSIS — M6281 Muscle weakness (generalized): Secondary | ICD-10-CM | POA: Diagnosis not present

## 2021-02-14 NOTE — Therapy (Signed)
[facility] Outpatient Rehabilitation [facility] [address] [place], [place], [address] Phone: [phone]   Fax:  [phone]  Physical Therapy Treatment  Patient Details  Name: Katelyn Bauer MRN: [id number] Date of Birth: 1990-04-07 Referring Provider (PT): Dr.Steven Steward Drone   Encounter Date: 02/14/2021   PT End of Session - 02/14/21 1014     Visit Number 3    Number of Visits 12    Date for PT Re-Evaluation 04/03/21    Authorization Type [employer]; authorized 12 visits initially    Authorization - Visit Number 3    Authorization - Number of Visits 12    PT Start Time 1015    PT Stop Time 1059    PT Time Calculation (min) 44 min    Activity Tolerance Patient tolerated treatment well    Behavior During Therapy [facility] for tasks assessed/performed             Past Medical History:  Diagnosis Date   Low bone density    Medical history non-contributory     Past Surgical History:  Procedure Laterality Date   NO PAST SURGERIES      There were no vitals filed for this visit.   Subjective Assessment - 02/14/21 1016     Subjective "I'm feeling better today than most days. I called the neuro number, but they said I need to call a neuro surgeon." She received a number to call for neurosurgery and tried calling them, but didn't get a hold of anyone, so has plans to try again.    Currently in Pain? No/denies                [facility] Adult PT Treatment/Exercise:   Therapeutic Exercise: NuStep L5 in UE and LE for 5 min Supine bridging with adduction ball squeeze 2 x 10, cues needed to complete full range  SLR 2 x 10 bilateral Clamshell 2 x 10 green band bilateral  LAQ 1 x 10 bilateral 1#  Updated HEP      Manual Therapy:   Neuromuscular re-ed: N/A   Therapeutic Activity: N/A   Modalities: N/A   Self Care: N/A     Consider / progression for next session:  - progress to standing strengthening as tolerated,  - static balance  training                           PT Short Term Goals - 02/06/21 0855       PT SHORT TERM GOAL #1   Title Pt will be I with initial HEP    Time 4    Period Weeks    Status New    Target Date 03/06/21      PT SHORT TERM GOAL #2   Title Pt will be able to walk consistently without crutches and a gait speed    Time 4    Period Weeks    Status New    Target Date 03/06/21      PT SHORT TERM GOAL #3   Title Pt will be able to complete 2 min walk test and set goal.    Time 2    Period Weeks    Status New    Target Date 02/20/21               PT Long Term Goals - 02/06/21 0913       PT LONG TERM GOAL #1   Title Pt will be able  to show independence with HEP for long term strength and conditioning    Baseline given inital on eval, unknown to pt    Time 8    Period Weeks    Status New    Target Date 04/03/21      PT LONG TERM GOAL #2   Title Pt will be able to to walk for 30 min in the community without knee or back pain    Baseline 5-10 min    Time 4    Period Weeks    Status New    Target Date 04/03/21      PT LONG TERM GOAL #3   Title Pt will be able to negotiate stairs 12 + in her home without the use of rails    Baseline relies heaviily on rails    Time 8    Period Weeks    Status New    Target Date 04/03/21      PT LONG TERM GOAL #4   Title Pt will report no pain in hips, back with standing, ADLs > 15 min    Baseline pain can be moderate to severe at times    Time 8    Period Weeks    Status New    Target Date 04/03/21                   Plan - 02/14/21 1015     Clinical Impression Statement Session focused on BLE strengthening. She remains very challenged with [place] LE strengthening as she quickly fatigues and visible shaking is present with minimal reps of hip and knee strenghtening. Quad lag becomes present after about 5 reps of SLR requiring consistent cues to maintain quad activation for remainder of prescribed  reps. She rated her exertion as 16/20 on Borg RPE after 10 reps of bouts of strengthening. No reports of pain throughout session, but consistently described LE muscles as feeling "heavy."    PT Treatment/Interventions ADLs/Self Care Home Management;Neuromuscular re-education;Therapeutic activities;Functional mobility training;Balance training;Patient/family education;Therapeutic exercise;Manual techniques;Gait training;DME Instruction    PT Next Visit Plan advance HEP:  strength, gait , endurance    PT Home Exercise Plan Access Code: [email or web address]    Consulted and Agree with Plan of Care Patient             Patient will benefit from skilled therapeutic intervention in order to improve the following deficits and impairments:  Abnormal gait, Decreased strength, Decreased coordination, Decreased balance, Decreased mobility, Difficulty walking, Pain  Visit Diagnosis: Muscle weakness (generalized)  Other abnormalities of gait and mobility     Problem List Patient Active Problem List   Diagnosis Date Noted   Lower extremity weakness 01/27/2021   Vitamin D deficiency 04/16/2017   Elevated hemoglobin A1c 04/16/2017   GDM (gestational diabetes mellitus), class A1 02/19/2016   Letitia Libra, PT, DPT, ATC 02/14/21 11:02 AM   [facility] Outpatient Rehabilitation [facility] [address] [place], [place], [address] Phone: [phone]   Fax:  [phone]  Name: Katelyn Bauer MRN: [id number] Date of Birth: 22-Jan-1991

## 2021-02-19 ENCOUNTER — Ambulatory Visit: Payer: Medicaid Other

## 2021-02-19 ENCOUNTER — Other Ambulatory Visit: Payer: Self-pay

## 2021-02-19 DIAGNOSIS — R2689 Other abnormalities of gait and mobility: Secondary | ICD-10-CM

## 2021-02-19 DIAGNOSIS — M6281 Muscle weakness (generalized): Secondary | ICD-10-CM | POA: Diagnosis not present

## 2021-02-19 NOTE — Therapy (Addendum)
[facility] Outpatient Rehabilitation [facility] [address] [place], [place], [address] Phone: [phone]   Fax:  [phone]  Physical Therapy Treatment  Patient Details  Name: Katelyn Bauer MRN: [id number] Date of Birth: May 31, 1990 Referring Provider (PT): Dr.Steven Steward Drone   Encounter Date: 02/19/2021   PT End of Session - 02/19/21 1238     Visit Number 4    Number of Visits 12    Date for PT Re-Evaluation 04/03/21    Authorization Type [employer]; authorized 12 visits initially    Authorization - Visit Number 4    Authorization - Number of Visits 12    PT Start Time 1150   session limited due to patient being late.   PT Stop Time 1230    PT Time Calculation (min) 40 min    Activity Tolerance Patient tolerated treatment well    Behavior During Therapy WFL for tasks assessed/performed             Past Medical History:  Diagnosis Date   Low bone density    Medical history non-contributory     Past Surgical History:  Procedure Laterality Date   NO PAST SURGERIES      There were no vitals filed for this visit.   Subjective Assessment - 02/19/21 1157     Subjective "I feel pretty good, I noticed I'm doing better with stairs and my father said he could tell I'm walking better". Pt says the exercises in the HEP are still difficult but that she does them everyday.    Currently in Pain? No/denies                                     [facility] Adult PT Treatment/Exercise:   Therapeutic Exercise: Bike L1, 5 minutes  LAQ 2 x 10 bilateral 2#  Standing hip abduction 2x8 bilat Standing hip extension 2x10 bilat Sit to mat table, touch and go, 3x10    Manual Therapy: NA  Neuromuscular re-ed: EO/EC double leg static standing 1x30sec each  EO/EC double leg balance on airex 1x30sec each EO/EC tandem stance 1x30 each  SLS 2x30secs bilat each    Therapeutic Activity: N/A   Modalities: N/A   Self Care: N/A      Consider / progression for next session:  - progress to standing strengthening as tolerated,  - static balance training      PT Short Term Goals - 02/06/21 0855       PT SHORT TERM GOAL #1   Title Pt will be I with initial HEP    Time 4    Period Weeks    Status New    Target Date 03/06/21      PT SHORT TERM GOAL #2   Title Pt will be able to walk consistently without crutches and a gait speed    Time 4    Period Weeks    Status New    Target Date 03/06/21      PT SHORT TERM GOAL #3   Title Pt will be able to complete 2 min walk test and set goal.    Time 2    Period Weeks    Status New    Target Date 02/20/21               PT Long Term Goals - 02/06/21 0913       PT LONG TERM GOAL #1  Title Pt will be able to show independence with HEP for long term strength and conditioning    Baseline given inital on eval, unknown to pt    Time 8    Period Weeks    Status New    Target Date 04/03/21      PT LONG TERM GOAL #2   Title Pt will be able to to walk for 30 min in the community without knee or back pain    Baseline 5-10 min    Time 4    Period Weeks    Status New    Target Date 04/03/21      PT LONG TERM GOAL #3   Title Pt will be able to negotiate stairs 12 + in her home without the use of rails    Baseline relies heaviily on rails    Time 8    Period Weeks    Status New    Target Date 04/03/21      PT LONG TERM GOAL #4   Title Pt will report no pain in hips, back with standing, ADLs > 15 min    Baseline pain can be moderate to severe at times    Time 8    Period Weeks    Status New    Target Date 04/03/21                   Plan - 02/19/21 1238     Clinical Impression Statement Session focused on BLE strengthening in CKC and balance. Pt demonstrated good static balance with varying levels of visual input, but demonstrated significant muscle fatigue during single leg stance with visable muscle shaking. Pt responded well to  strengthening with reports of moderate muscle fatigue and visable muscle shaking during exercise. Pt required minimal verbal and tactile cues during the session for form and technique. Noticed quivering of quad muscle and entire LE bilat during seated rest following CKC exercise, further neurological measures need to be obtained.    PT Treatment/Interventions ADLs/Self Care Home Management;Neuromuscular re-education;Therapeutic activities;Functional mobility training;Balance training;Patient/family education;Therapeutic exercise;Manual techniques;Gait training;DME Instruction    PT Next Visit Plan advance HEP:  strength, gait , endurance    PT Home Exercise Plan Access Code: [email or web address]    Consulted and Agree with Plan of Care Patient             Patient will benefit from skilled therapeutic intervention in order to improve the following deficits and impairments:  Abnormal gait, Decreased strength, Decreased coordination, Decreased balance, Decreased mobility, Difficulty walking, Pain  Visit Diagnosis: No diagnosis found.     Problem List Patient Active Problem List   Diagnosis Date Noted   Lower extremity weakness 01/27/2021   Vitamin D deficiency 04/16/2017   Elevated hemoglobin A1c 04/16/2017   GDM (gestational diabetes mellitus), class A1 02/19/2016    Velna Ochs, SPT 02/19/21 1:30 PM   [facility] Out[facility] Rehabilitation [facility] [address] [place], [place], [address] Phone: [phone]   Fax:  [phone]  Name: Katelyn Bauer MRN: [id number] Date of Birth: 29-Nov-1990

## 2021-02-23 ENCOUNTER — Other Ambulatory Visit: Payer: Self-pay

## 2021-02-23 ENCOUNTER — Ambulatory Visit: Payer: Medicaid Other | Attending: Orthopaedic Surgery | Admitting: Physical Therapy

## 2021-02-23 ENCOUNTER — Encounter: Payer: Self-pay | Admitting: Physical Therapy

## 2021-02-23 DIAGNOSIS — M6281 Muscle weakness (generalized): Secondary | ICD-10-CM | POA: Diagnosis not present

## 2021-02-23 DIAGNOSIS — R2689 Other abnormalities of gait and mobility: Secondary | ICD-10-CM | POA: Insufficient documentation

## 2021-02-23 NOTE — Therapy (Signed)
[facility] Outpatient Rehabilitation [facility] [place] [address] [place], [place], [address] Phone: [phone]   Fax:  [phone]  Physical Therapy Treatment  Patient Details  Name: Katelyn Bauer MRN: [id number] Date of Birth: 1990-04-21 Referring Provider (PT): Dr.Steven Steward Drone   Encounter Date: 02/23/2021   PT End of Session - 02/23/21 0813     Visit Number 5    Number of Visits 12    Date for PT Re-Evaluation 04/03/21    Authorization Type [employer]; authorized 12 visits initially    Authorization - Visit Number 5    Authorization - Number of Visits 12    PT Start Time 0807    PT Stop Time 0847    PT Time Calculation (min) 40 min    Activity Tolerance Patient tolerated treatment well    Behavior During Therapy [facility] for tasks assessed/performed             Past Medical History:  Diagnosis Date   Low bone density    Medical history non-contributory     Past Surgical History:  Procedure Laterality Date   NO PAST SURGERIES      There were no vitals filed for this visit.   Subjective Assessment - 02/23/21 0812     Subjective I dont use my crutches at all.  I walk up and down the stairs better. Knee pain is rare.    Currently in Pain? No/denies            [facility] Adult PT Treatment/Exercise:   Therapeutic Exercise: NuStep L6 UE and LE for 5 min  Supported squats x 15  Air squat x 10, knee pain  Wall squat x 10 no wgt.  Added 10 lbs KB x 10 reps  Step up with balance x 15 each LE (6 inch )  Reverse step down x 6 inch x 10 Reverse step with 10 lb KB in 1 hand  Lateral step to squat green looped band x 10  Forward alternating lunge green band x 10 Side lunge alternating lunge x 10 (feet stay parallel) 10 lbs KB  Piriformis stretching 30 sec x 3     Manual Therapy:   Neuromuscular re-ed: N/A   Therapeutic Activity: N/A   Modalities: N/A   Self Care: N/A   Consider / progression for next session:            PT  Short Term Goals - 02/23/21 0850       PT SHORT TERM GOAL #1   Title Pt will be I with initial HEP    Status Achieved      PT SHORT TERM GOAL #2   Title Pt will be able to walk consistently without crutches and a gait speed 0.8 m/s or better    Status On-going      PT SHORT TERM GOAL #3   Title Pt will be able to complete 2 min walk test and set goal.    Status Unable to assess               PT Long Term Goals - 02/06/21 0913       PT LONG TERM GOAL #1   Title Pt will be able to show independence with HEP for long term strength and conditioning    Baseline given inital on eval, unknown to pt    Time 8    Period Weeks    Status New    Target Date 04/03/21      PT  LONG TERM GOAL #2   Title Pt will be able to to walk for 30 min in the community without knee or back pain    Baseline 5-10 min    Time 4    Period Weeks    Status New    Target Date 04/03/21      PT LONG TERM GOAL #3   Title Pt will be able to negotiate stairs 12 + in her home without the use of rails    Baseline relies heaviily on rails    Time 8    Period Weeks    Status New    Target Date 04/03/21      PT LONG TERM GOAL #4   Title Pt will report no pain in hips, back with standing, ADLs > 15 min    Baseline pain can be moderate to severe at times    Time 8    Period Weeks    Status New    Target Date 04/03/21                   Plan - 02/23/21 0840     Clinical Impression Statement Patient is improving in balance and gait, able to stand and perform dynamic balance exercises without the use of UE for balance/steadying. She needed rest breaks between sets but less than previously noted in session. Min knee pain with squats, less when corrected for form.    PT Treatment/Interventions ADLs/Self Care Home Management;Neuromuscular re-education;Therapeutic activities;Functional mobility training;Balance training;Patient/family education;Therapeutic exercise;Manual techniques;Gait training;DME  Instruction    PT Next Visit Plan Gait speed-   2 min walk test. focus on lateral hip , CKC and endurance    PT Home Exercise Plan Access Code: [email or web address]    Consulted and Agree with Plan of Care Patient             Patient will benefit from skilled therapeutic intervention in order to improve the following deficits and impairments:  Abnormal gait, Decreased strength, Decreased coordination, Decreased balance, Decreased mobility, Difficulty walking, Pain  Visit Diagnosis: Muscle weakness (generalized)  Other abnormalities of gait and mobility     Problem List Patient Active Problem List   Diagnosis Date Noted   Lower extremity weakness 01/27/2021   Vitamin D deficiency 04/16/2017   Elevated hemoglobin A1c 04/16/2017   GDM (gestational diabetes mellitus), class A1 02/19/2016    Bristal Steffy, PT 02/23/2021, 8:53 AM  [facility] [address] [place], [place], [address] Phone: [phone]   Fax:  [phone]  Name: Katelyn Bauer MRN: [id number] Date of Birth: 1990/08/12   Karie Mainland, PT 02/23/21 8:54 AM Phone: [phone] Fax: [phone]

## 2021-02-26 ENCOUNTER — Ambulatory Visit: Payer: Medicaid Other

## 2021-02-26 ENCOUNTER — Other Ambulatory Visit: Payer: Self-pay

## 2021-02-26 DIAGNOSIS — R2689 Other abnormalities of gait and mobility: Secondary | ICD-10-CM | POA: Diagnosis not present

## 2021-02-26 DIAGNOSIS — M6281 Muscle weakness (generalized): Secondary | ICD-10-CM | POA: Diagnosis not present

## 2021-02-26 NOTE — Therapy (Addendum)
[facility] Outpatient Rehabilitation [facility] [address] [place], [place], [address] Phone: [phone]   Fax:  [phone]  Physical Therapy Treatment  Patient Details  Name: Katelyn Bauer MRN: [id number] Date of Birth: 05-14-1990 Referring Provider (PT): Dr.Steven Steward Drone   Encounter Date: 02/26/2021   PT End of Session - 02/26/21 0953     Visit Number 6    Number of Visits 12    Date for PT Re-Evaluation 04/03/21    Authorization Type [employer]; authorized 12 visits initially    Authorization - Visit Number 6    Authorization - Number of Visits 12    PT Start Time [phone]   session limited due to pt being late   PT Stop Time 0930    PT Time Calculation (min) 40 min    Equipment Utilized During Treatment Gait belt    Activity Tolerance Patient tolerated treatment well    Behavior During Therapy WFL for tasks assessed/performed             Past Medical History:  Diagnosis Date   Low bone density    Medical history non-contributory     Past Surgical History:  Procedure Laterality Date   NO PAST SURGERIES      There were no vitals filed for this visit.   Subjective Assessment - 02/26/21 0942     Subjective Pt reports her HEP is getting easier for her to complete and that overall she is feeling much better than when she started.    Currently in Pain? No/denies            [facility] Adult PT Treatment/Exercise:   Therapeutic Exercise: Squat to mat table 1 x 10 Squat to mat table 1 x 10 10lb KB  Reverse step down x 6 inch 2 x 10 each leg Forward alternating lunge to floor 1x10 each leg  Leg press 1x15 55lbs with blue band around thighs  Side step with blue band 1x10 steps each leg  Straight leg raise 1x10 each leg  Bridges 1x10 Piriformis stretching 30 sec x 3      Manual Therapy:  N/A Neuromuscular re-ed: N/A   Therapeutic Activity: N/A   Modalities: N/A   Self Care: N/A   Consider / progression for next  session:    [facility] PT Assessment - 02/26/21 0001       Special Tests   Other special tests Clonus (-) bilat      Ambulation/Gait   Gait Comments 355 ft                                      PT Short Term Goals - 02/23/21 0850       PT SHORT TERM GOAL #1   Title Pt will be I with initial HEP    Status Achieved      PT SHORT TERM GOAL #2   Title Pt will be able to walk consistently without crutches and a gait speed 0.8 m/s or better    Status On-going      PT SHORT TERM GOAL #3   Title Pt will be able to complete 2 min walk test and set goal.    Status Unable to assess               PT Long Term Goals - 02/06/21 0913       PT LONG TERM GOAL #1  Title Pt will be able to show independence with HEP for long term strength and conditioning    Baseline given inital on eval, unknown to pt    Time 8    Period Weeks    Status New    Target Date 04/03/21      PT LONG TERM GOAL #2   Title Pt will be able to to walk for 30 min in the community without knee or back pain    Baseline 5-10 min    Time 4    Period Weeks    Status New    Target Date 04/03/21      PT LONG TERM GOAL #3   Title Pt will be able to negotiate stairs 12 + in her home without the use of rails    Baseline relies heaviily on rails    Time 8    Period Weeks    Status New    Target Date 04/03/21      PT LONG TERM GOAL #4   Title Pt will report no pain in hips, back with standing, ADLs > 15 min    Baseline pain can be moderate to severe at times    Time 8    Period Weeks    Status New    Target Date 04/03/21                   Plan - 02/26/21 0945     Clinical Impression Statement Pt able to increase distance in 2 minute walk test with no reports of fatigue indicating significant improvement in LE strength and endurance. Pt tolerated todays treatment session well with minimal reports of fatigue throughout CKC strengthening. Minimal knee pain "pressure"  reported with reverse step downs that did not change when correct for form.    PT Treatment/Interventions ADLs/Self Care Home Management;Neuromuscular re-education;Therapeutic activities;Functional mobility training;Balance training;Patient/family education;Therapeutic exercise;Manual techniques;Gait training;DME Instruction    PT Next Visit Plan Progress CKC and endurance; address power deficits    PT Home Exercise Plan Access Code: [email or web address]    Consulted and Agree with Plan of Care Patient             Patient will benefit from skilled therapeutic intervention in order to improve the following deficits and impairments:  Abnormal gait, Decreased strength, Decreased coordination, Decreased balance, Decreased mobility, Difficulty walking, Pain  Visit Diagnosis: No diagnosis found.     Problem List Patient Active Problem List   Diagnosis Date Noted   Lower extremity weakness 01/27/2021   Vitamin D deficiency 04/16/2017   Elevated hemoglobin A1c 04/16/2017   GDM (gestational diabetes mellitus), class A1 02/19/2016    Velna Ochs, SPT 02/26/21 10:03 AM   [facility] Outpatient Rehabilitation [facility] [address] [place], [place], [address] Phone: [phone]   Fax:  [phone]  Name: Nydia Ytuarte MRN: [id number] Date of Birth: 10-09-1990

## 2021-03-01 ENCOUNTER — Other Ambulatory Visit: Payer: Self-pay

## 2021-03-01 ENCOUNTER — Ambulatory Visit: Payer: Medicaid Other | Admitting: Physical Therapy

## 2021-03-01 DIAGNOSIS — R2689 Other abnormalities of gait and mobility: Secondary | ICD-10-CM | POA: Diagnosis not present

## 2021-03-01 DIAGNOSIS — M6281 Muscle weakness (generalized): Secondary | ICD-10-CM | POA: Diagnosis not present

## 2021-03-01 NOTE — Therapy (Signed)
[facility] Outpatient Rehabilitation [facility] [address] [place], [place], [address] Phone: [phone]   Fax:  [phone]  Physical Therapy Treatment  Patient Details  Name: Katelyn Bauer MRN: [id number] Date of Birth: June 29, 1990 Referring Provider (PT): Dr.Steven Steward Drone   Encounter Date: 03/01/2021   PT End of Session - 03/01/21 0857     Visit Number 7    Number of Visits 12    Date for PT Re-Evaluation 04/03/21    Authorization Type [employer]; authorized 12 visits initially    Authorization - Visit Number 7    Authorization - Number of Visits 12    PT Start Time [phone]    PT Stop Time 0935    PT Time Calculation (min) 40 min    Activity Tolerance Patient tolerated treatment well    Behavior During Therapy [facility] for tasks assessed/performed             Past Medical History:  Diagnosis Date   Low bone density    Medical history non-contributory     Past Surgical History:  Procedure Laterality Date   NO PAST SURGERIES      There were no vitals filed for this visit.   Subjective Assessment - 03/01/21 0914     Subjective I am so much better than when I started.  I can go up and down the stairs easily.    Currently in Pain? No/denies             [facility] Adult PT Treatment/Exercise:   Therapeutic Exercise: Recumbent bike L2 for 5 min  Knee extension 20 lbs x 10, 15 lbs x 15 RDL 25 lbs x 10 , min cues needed  TRX  Squats x 15 Curtsy squat x 10 each LE  Single leg hip hinge standing on foam x 10 added knee lift  V lift (unable) changed to high row x 10   Stretching knee to chest and lower trunk rotation x 10       PT Short Term Goals - 03/01/21 0932       PT SHORT TERM GOAL #1   Title Pt will be I with initial HEP    Status Achieved      PT SHORT TERM GOAL #2   Title Pt will be able to walk consistently without crutches and a gait speed 0.8 m/s or better    Status Achieved      PT SHORT TERM GOAL #3   Title Pt will be  able to complete 2 min walk test and set goal.    Status Achieved               PT Long Term Goals - 03/01/21 0933       PT LONG TERM GOAL #1   Title Pt will be able to show independence with HEP for long term strength and conditioning    Status On-going      PT LONG TERM GOAL #2   Title Pt will be able to to walk for 30 min in the community without knee or back pain    Status On-going      PT LONG TERM GOAL #3   Title Pt will be able to negotiate stairs 12 + in her home without the use of rails    Status Achieved      PT LONG TERM GOAL #4   Title Pt will report no pain in hips, back with standing, ADLs > 15 min    Status  On-going      PT LONG TERM GOAL #5   Title Pt will be able to walk 450 feet or more in 2 min    Status New                   Plan - 03/01/21 0853     Clinical Impression Statement Patient is making great progress with gait especially.  She had min increase in back pain with dead lift/hip hinging today.  Pain ceased when exercise did.  Cont POC, awaiting Neuro appt.    PT Treatment/Interventions ADLs/Self Care Home Management;Neuromuscular re-education;Therapeutic activities;Functional mobility training;Balance training;Patient/family education;Therapeutic exercise;Manual techniques;Gait training;DME Instruction    PT Next Visit Plan Progress CKC and endurance; address power deficits    PT Home Exercise Plan Access Code: [email or web address]    Consulted and Agree with Plan of Care Patient             Patient will benefit from skilled therapeutic intervention in order to improve the following deficits and impairments:  Abnormal gait, Decreased strength, Decreased coordination, Decreased balance, Decreased mobility, Difficulty walking, Pain  Visit Diagnosis: Muscle weakness (generalized)  Other abnormalities of gait and mobility     Problem List Patient Active Problem List   Diagnosis Date Noted   Lower extremity weakness 01/27/2021    Vitamin D deficiency 04/16/2017   Elevated hemoglobin A1c 04/16/2017   GDM (gestational diabetes mellitus), class A1 02/19/2016    Kinzlie Harney, PT 03/01/2021, 9:36 AM  [facility] [address] [place], [place], [address] Phone: [phone]   Fax:  [phone]  Name: Joeleen Wortley MRN: [id number] Date of Birth: 1990/10/17   Karie Mainland, PT 03/01/21 9:36 AM Phone: [phone] Fax: [phone]

## 2021-03-05 ENCOUNTER — Ambulatory Visit: Payer: Medicaid Other

## 2021-03-08 ENCOUNTER — Encounter: Payer: Self-pay | Admitting: Physical Therapy

## 2021-03-08 ENCOUNTER — Other Ambulatory Visit: Payer: Self-pay

## 2021-03-08 ENCOUNTER — Ambulatory Visit: Payer: Medicaid Other | Admitting: Physical Therapy

## 2021-03-08 DIAGNOSIS — R2689 Other abnormalities of gait and mobility: Secondary | ICD-10-CM | POA: Diagnosis not present

## 2021-03-08 DIAGNOSIS — M6281 Muscle weakness (generalized): Secondary | ICD-10-CM

## 2021-03-08 NOTE — Therapy (Signed)
[facility] [address] [place], [place], [address] Phone: [phone]   Fax:  [phone]  Physical Therapy Treatment  Patient Details  Name: Rilie Glanz MRN: [id number] Date of Birth: 1990-09-12 Referring Provider (PT): Dr.Steven Steward Drone   Encounter Date: 03/08/2021   PT End of Session - 03/08/21 0855     Visit Number 8    Number of Visits 12    Date for PT Re-Evaluation 04/03/21    Authorization Type [employer]; authorized 12 visits initially    Authorization - Visit Number 8    Authorization - Number of Visits 12    PT Start Time [id number]    PT Stop Time 0931    PT Time Calculation (min) 38 min    Activity Tolerance Patient tolerated treatment well    Behavior During Therapy [facility] for tasks assessed/performed             Past Medical History:  Diagnosis Date   Low bone density    Medical history non-contributory     Past Surgical History:  Procedure Laterality Date   NO PAST SURGERIES      There were no vitals filed for this visit.   Subjective Assessment - 03/08/21 0854     Subjective Tired.  I have gotten a little weaker the past few days.  I am bored maybe thats why.  Doing HEP but school is over and im unemployed.    Currently in Pain? No/denies               [facility] Adult PT Treatment/Exercise:   Therapeutic Exercise: NuStep L8 UE and LE for 6 min  Knee extension 20 lbs x 2 x 12 Foam pad squats x 10 added knee lift for 10 more  Sumo squats 15 lbs x 10 High knee slow march qwith 15 lbs x 10 (on floor)  Single leg dumbbell pass 5 lbs x 30 sec each LE  Reverse step down 8 inch without wgt 1 ue assist x 15 (deficit lunge)  Lower trunk rotation with knees crossed x 5 each direction  90/90 toe taps with mod cues x 10 each side  Knee to chest 30 sec x 2 each side   Consider / progression for next session:          PT Short Term Goals - 03/01/21 0932       PT SHORT TERM GOAL #1    Title Pt will be I with initial HEP    Status Achieved      PT SHORT TERM GOAL #2   Title Pt will be able to walk consistently without crutches and a gait speed 0.8 m/s or better    Status Achieved      PT SHORT TERM GOAL #3   Title Pt will be able to complete 2 min walk test and set goal.    Status Achieved               PT Long Term Goals - 03/01/21 0933       PT LONG TERM GOAL #1   Title Pt will be able to show independence with HEP for long term strength and conditioning    Status On-going      PT LONG TERM GOAL #2   Title Pt will be able to to walk for 30 min in the community without knee or back pain    Status On-going      PT LONG TERM GOAL #3  Title Pt will be able to negotiate stairs 12 + in her home without the use of rails    Status Achieved      PT LONG TERM GOAL #4   Title Pt will report no pain in hips, back with standing, ADLs > 15 min    Status On-going      PT LONG TERM GOAL #5   Title Pt will be able to walk 450 feet or more in 2 min    Status New                   Plan - 03/08/21 0902     Clinical Impression Statement Patient continues to do well, fatigued but no pain today.  She sees Dr. Steward Drone 12/27 and has appts for a few more weeks.  Will cont POC and progress endurance, gait and overall strength. Walked out with wide gait, weakened glute pattern.    PT Treatment/Interventions ADLs/Self Care Home Management;Neuromuscular re-education;Therapeutic activities;Functional mobility training;Balance training;Patient/family education;Therapeutic exercise;Manual techniques;Gait training;DME Instruction    PT Next Visit Plan Progress CKC and endurance; address power deficits    PT Home Exercise Plan Access Code: [email or web address]    Recommended Other Services Neuro    Consulted and Agree with Plan of Care Patient             Patient will benefit from skilled therapeutic intervention in order to improve the following deficits and impairments:   Abnormal gait, Decreased strength, Decreased coordination, Decreased balance, Decreased mobility, Difficulty walking, Pain  Visit Diagnosis: Muscle weakness (generalized)  Other abnormalities of gait and mobility     Problem List Patient Active Problem List   Diagnosis Date Noted   Lower extremity weakness 01/27/2021   Vitamin D deficiency 04/16/2017   Elevated hemoglobin A1c 04/16/2017   GDM (gestational diabetes mellitus), class A1 02/19/2016    Loi Rennaker, PT 03/08/2021, 9:38 AM  [facility] [address] [place], [place], [address] Phone: [phone]   Fax:  [phone]  Name: Taraann Olthoff MRN: [id number] Date of Birth: 1990-04-04   Karie Mainland, PT 03/08/21 9:38 AM Phone: [phone] Fax: [phone]

## 2021-03-12 ENCOUNTER — Ambulatory Visit: Payer: Medicaid Other

## 2021-03-12 ENCOUNTER — Telehealth: Payer: Self-pay

## 2021-03-12 NOTE — Telephone Encounter (Signed)
Spoke with patient regarding missed PT appointment. Patient had the appointment time wrong and apologized for missing this visit. Confirmed next scheduled appointment.   Letitia Libra, PT, DPT, ATC 03/12/21 8:25 AM

## 2021-03-14 ENCOUNTER — Other Ambulatory Visit: Payer: Self-pay

## 2021-03-14 ENCOUNTER — Ambulatory Visit: Payer: Medicaid Other

## 2021-03-14 DIAGNOSIS — R2689 Other abnormalities of gait and mobility: Secondary | ICD-10-CM | POA: Diagnosis not present

## 2021-03-14 DIAGNOSIS — M6281 Muscle weakness (generalized): Secondary | ICD-10-CM

## 2021-03-14 NOTE — Therapy (Signed)
°[facility] °[address] °[place], [place], [address] °Phone: [phone]   Fax:  [phone] ° °Physical Therapy Treatment ° °Patient Details  °Name: Katelyn Bauer °MRN: [id number] °Date of Birth: 10/30/1990 °Referring Provider (PT): Dr.Steven Bokshan ° ° °Encounter Date: 03/14/2021 ° ° PT End of Session - 03/14/21 0931   ° ° Visit Number 9   ° Number of Visits 12   ° Date for PT Re-Evaluation 04/03/21   ° Authorization Type [employer]; authorized 12 visits initially   ° Authorization - Visit Number 9   ° Authorization - Number of Visits 12   ° PT Start Time 0931   ° PT Stop Time 1012   ° PT Time Calculation (min) 41 min   ° Activity Tolerance Patient tolerated treatment well   ° Behavior During Therapy WFL for tasks assessed/performed   ° °  °  ° °  ° ° °Past Medical History:  °Diagnosis Date  ° Low bone density   ° Medical history non-contributory   ° ° °Past Surgical History:  °Procedure Laterality Date  ° NO PAST SURGERIES    ° ° °There were no vitals filed for this visit. ° ° Subjective Assessment - 03/14/21 0933   ° ° Subjective Patient reports she is doing pretty good right now. She reports compliance with HEP and this helps to keep her day busy.   ° How long can you stand comfortably? unknown   ° How long can you walk comfortably? about 30-45 minutes   ° Currently in Pain? No/denies   ° °  °  ° °  ° ° ° ° ° [facility] PT Assessment - 03/14/21 0001   ° °  ° Ambulation/Gait  ° Gait Comments 2 MWT 506 ft   ° °  °  ° °  °[facility] Adult PT Treatment/Exercise: °  °Therapeutic Exercise: °2 MWT °Step ups 10 inch step 2 x 10 bilateral  °Hip cybex flexion 2 x 10 @ 37.5 lbs bilateral  °SL bridge 2 x 8 bilateral  °Kettlebell swings 2 x 10 10# °Updated HEP ° °  ° ° ° ° ° ° ° ° ° ° ° ° ° ° ° ° ° ° ° ° ° ° ° ° ° ° PT Short Term Goals - 03/01/21 0932   ° °  ° PT SHORT TERM GOAL #1  ° Title Pt will be I with initial HEP   ° Status Achieved   °  ° PT SHORT TERM GOAL #2  ° Title  Pt will be able to walk consistently without crutches and a gait speed 0.8 m/s or better   ° Status Achieved   °  ° PT SHORT TERM GOAL #3  ° Title Pt will be able to complete 2 min walk test and set goal.   ° Status Achieved   ° °  °  ° °  ° ° ° ° PT Long Term Goals - 03/14/21 0940   ° °  ° PT LONG TERM GOAL #1  ° Title Pt will be able to show independence with HEP for long term strength and conditioning   ° Baseline progressing HEP as appropriate   ° Status On-going   °  ° PT LONG TERM GOAL #2  ° Title Pt will be able to to walk for 30 min in the community without knee or back pain   ° Baseline 30-45 minutes 03/14/21   ° Status Achieved   °  °   PT LONG TERM GOAL #3   Title Pt will be able to negotiate stairs 12 + in her home without the use of rails    Status Achieved      PT LONG TERM GOAL #4   Title Pt will report no pain in hips, back with standing, ADLs > 15 min    Status On-going      PT LONG TERM GOAL #5   Title Pt will be able to walk 450 feet or more in 2 min    Baseline 506 ft 03/14/21    Status Achieved                   Plan - 03/14/21 0940     Clinical Impression Statement Katelyn Bauer tolerated session well today continuing to focus on BLE strength and endurance. Her 2 minute walk distance has much improved having met this long term goal. She reports that her walking tolerance has significantly improved compared to baseline having met this long term goal as well. Her overall endurance is improving as she requires minimal rest breaks during ther ex today. She endorses fatigue at end of session, though no pain.    PT Treatment/Interventions ADLs/Self Care Home Management;Neuromuscular re-education;Therapeutic activities;Functional mobility training;Balance training;Patient/family education;Therapeutic exercise;Manual techniques;Gait training;DME Instruction    PT Next Visit Plan Progress CKC and endurance; address power deficits    PT Home Exercise Plan Access Code: [email or web address]     NBVAPOLID and Agree with Plan of Care Patient             Patient will benefit from skilled therapeutic intervention in order to improve the following deficits and impairments:  Abnormal gait, Decreased strength, Decreased coordination, Decreased balance, Decreased mobility, Difficulty walking, Pain  Visit Diagnosis: Muscle weakness (generalized)  Other abnormalities of gait and mobility     Problem List Patient Active Problem List   Diagnosis Date Noted   Lower extremity weakness 01/27/2021   Vitamin D deficiency 04/16/2017   Elevated hemoglobin A1c 04/16/2017   GDM (gestational diabetes mellitus), class A1 02/19/2016   Gwendolyn Grant, PT, DPT, ATC 03/14/21 10:12 AM  [facility] [address] [place], [place], [address] Phone: [phone]   Fax:  [phone]  Name: Katelyn Bauer MRN: [id number] Date of Birth: 1990-08-04

## 2021-03-15 ENCOUNTER — Ambulatory Visit (HOSPITAL_BASED_OUTPATIENT_CLINIC_OR_DEPARTMENT_OTHER): Payer: Medicaid Other | Admitting: Orthopaedic Surgery

## 2021-03-20 ENCOUNTER — Other Ambulatory Visit: Payer: Self-pay

## 2021-03-20 ENCOUNTER — Encounter: Payer: Self-pay | Admitting: Physical Therapy

## 2021-03-20 ENCOUNTER — Ambulatory Visit: Payer: Medicaid Other | Admitting: Physical Therapy

## 2021-03-20 ENCOUNTER — Ambulatory Visit (INDEPENDENT_AMBULATORY_CARE_PROVIDER_SITE_OTHER): Payer: Medicaid Other | Admitting: Orthopaedic Surgery

## 2021-03-20 DIAGNOSIS — M6281 Muscle weakness (generalized): Secondary | ICD-10-CM

## 2021-03-20 DIAGNOSIS — M222X1 Patellofemoral disorders, right knee: Secondary | ICD-10-CM | POA: Diagnosis not present

## 2021-03-20 DIAGNOSIS — M222X2 Patellofemoral disorders, left knee: Secondary | ICD-10-CM | POA: Diagnosis not present

## 2021-03-20 DIAGNOSIS — R2689 Other abnormalities of gait and mobility: Secondary | ICD-10-CM | POA: Diagnosis not present

## 2021-03-20 NOTE — Progress Notes (Signed)
Chief Complaint: bilateral knee pain     History of Present Illness:   03/20/2021: Presents today overall doing extremely well with physical therapy.  She has no pain in the knees.  She is not utilizing her crutches at this time.  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  Katelyn Bauer is a 30 y.o. female with bilateral knee pain going on since August of this year.  She has not had any specific injury or trauma.  She states that she feels pressure in both knees and this is worse with stairs.  She has been in [employer] and was discharged from this as she was told that she had low bone density.  She was also given crutches for partial weightbearing which have not helped.  She prefers to be active and enjoys walking.  She has been taking ibuprofen and Tylenol which did not help so she stopped taking   Surgical History:   None  PMH/PSH/Family History/Social History/Meds/Allergies:    Past Medical History:  Diagnosis Date   Low bone density    Medical history non-contributory    Past Surgical History:  Procedure Laterality Date   NO PAST SURGERIES     Social History   Socioeconomic History   Marital status: Single    Spouse name: Not on file   Number of children: Not on file   Years of education: Not on file   Highest education level: Not on file  Occupational History   Not on file  Tobacco Use   Smoking status: Former   Smokeless tobacco: Never  Substance and Sexual Activity   Alcohol use: No    Alcohol/week: 0.0 standard drinks    Comment: Occasional   Drug use: No   Sexual activity: Not Currently    Partners: Male    Birth control/protection: Injection, Abstinence  Other Topics Concern   Not on file  Social History Narrative   Not on file   Social Determinants of Health   Financial Resource Strain: Not on file  Food Insecurity: Not on file  Transportation Needs: Not on file  Physical Activity: Not on file   Stress: Not on file  Social Connections: Not on file   Family History  Problem Relation Age of Onset   Lung cancer Paternal Grandmother        2nd hand smoking    Brain cancer Paternal Grandmother    Allergies  Allergen Reactions   Penicillins Swelling and Other (See Comments)    Reaction:  All over body swelling  Has patient had a PCN reaction causing immediate rash, facial/tongue/throat swelling, SOB or lightheadedness with hypotension: Yes Has patient had a PCN reaction causing severe rash involving mucus membranes or skin necrosis: No Has patient had a PCN reaction that required hospitalization No Has patient had a PCN reaction occurring within the last 10 years: No If all of the above answers are "NO", then may proceed with Cephalosporin use.     Current Outpatient Medications  Medication Sig Dispense Refill   CALCIUM PO Take 1 tablet by mouth daily.     ibuprofen (ADVIL) 800 MG tablet Take 800 mg by mouth every 8 (eight) hours as needed for mild pain. (Patient not taking: Reported on 02/06/2021)     VITAMIN D PO Take 1 tablet by  mouth daily.     No current facility-administered medications for this visit.   No results found.  Review of Systems:   A ROS was performed including pertinent positives and negatives as documented in the HPI.  Physical Exam :   Constitutional: NAD and appears stated age Neurological: Alert and oriented Psych: Appropriate affect and cooperative There were no vitals taken for this visit.   Comprehensive Musculoskeletal Exam:    She has bilateral central based knee pain.  Increased Q angle bilaterally.  No joint line tenderness.  Range of motion about bilateral knees is from -3 to 140 degrees without pain.  Negative Lachman and posterior drawer.  Improved hip strength with flexion about the hips resolved knee pain  Imaging:   Xray (4 views bilateral knees): Normal   I personally reviewed and interpreted the radiographs.   Assessment:    30 year old female with bilateral patellofemoral pain likely due to increased Q angle bilaterally in addition to hip weakness.  This is resolved with hip and leg strengthening At this time she may be full activity and return to [employer] duties.  I will see her back on an as-needed basis.  Plan :    -she will follow-up with me as needed     I personally saw and evaluated the patient, and participated in the management and treatment plan.  Huel Cote, MD Attending Physician, Orthopedic Surgery  This document was dictated using Dragon voice recognition software. A reasonable attempt at proof reading has been made to minimize errors.

## 2021-03-20 NOTE — Therapy (Signed)
[place], [place], [address] Phone: [phone]   Fax:  [phone]  Physical Therapy Treatment  Patient Details  Name: Katelyn Bauer MRN: [id number] Date of Birth: 1990/04/03 Referring Provider (PT): Dr.Steven Sammuel Hines   Encounter Date: 03/20/2021   PT End of Session - 03/20/21 0809     Visit Number 10    Number of Visits 12    Authorization Type [employer]; authorized 12 visits initially    Authorization - Visit Number 10    Authorization - Number of Visits 12    PT Start Time 0805    PT Stop Time 4627    PT Time Calculation (min) 39 min    Activity Tolerance Patient tolerated treatment well    Behavior During Therapy [facility] for tasks assessed/performed             Past Medical History:  Diagnosis Date   Low bone density    Medical history non-contributory     Past Surgical History:  Procedure Laterality Date   NO PAST SURGERIES      There were no vitals filed for this visit.   Subjective Assessment - 03/20/21 0807     Subjective I was really bad this weekend.  Friday was out in the cold and my neck throat swelled up . Hard to eat, drink anything.  This has happened to me before.  COVID was neg.  Seeing Dr.Bokshan and may get someone else to see her (primary care), not sure.  no pain .    Currently in Pain? No/denies             [facility] Adult PT Treatment/Exercise:   Therapeutic Exercise: NuStep L7 UE and LE for 6 min  Squat 15 lbs x 15  Hip hinge/RDL x 15 lbs x 15  Weighted step up 8 inch step 10 lbs each LE Single leg hip hinge with 8 lbs x 10 each LE  Added overhead press x 5 each side . Mod cues needed throughout including bicep curl to overhead press Walking lunge no wgt.x 25 fete Added 1 8 lbs DB x 25 feet  Then 2 x 8 lbs DB x 25 feet    Self Care: POC    Consider / progression for next session:  Finish POC, DC                  PT Short Term Goals  - 03/01/21 0932       PT SHORT TERM GOAL #1   Title Pt will be I with initial HEP    Status Achieved      PT SHORT TERM GOAL #2   Title Pt will be able to walk consistently without crutches and a gait speed 0.8 m/s or better    Status Achieved      PT SHORT TERM GOAL #3   Title Pt will be able to complete 2 min walk test and set goal.    Status Achieved               PT Long Term Goals - 03/20/21 0811       PT LONG TERM GOAL #1   Title Pt will be able to show independence with HEP for long term strength and conditioning    Status On-going      PT LONG TERM GOAL #2   Title Pt will be able to to walk for 30 min in the community without knee or back  pain    Baseline cannot recall the last time she had pain in back or knees    Status Achieved      PT LONG TERM GOAL #3   Title Pt will be able to negotiate stairs 12 + in her home without the use of rails    Status Achieved      PT LONG TERM GOAL #4   Title Pt will report no pain in hips, back with standing, ADLs > 15 min    Status Achieved      PT LONG TERM GOAL #5   Title Pt will be able to walk 450 feet or more in 2 min    Status Achieved                   Plan - 03/20/21 0820     Clinical Impression Statement Patient has met her goals.  She would like to finish her POC this week after her visit to Dr. Sammuel Hines.  She still needs rest breaks at times but overall her strength is normal.  She needed cues for form with shoulder exercises.    PT Treatment/Interventions ADLs/Self Care Home Management;Neuromuscular re-education;Therapeutic activities;Functional mobility training;Balance training;Patient/family education;Therapeutic exercise;Manual techniques;Gait training;DME Instruction    PT Next Visit Plan final visits. Ready for DC> What did MD say Progress CKC and endurance; address power deficits    PT Home Exercise Plan Access Code: [email or web address]    Consulted and Agree with Plan of Care Patient              Patient will benefit from skilled therapeutic intervention in order to improve the following deficits and impairments:  Abnormal gait, Decreased strength, Decreased coordination, Decreased balance, Decreased mobility, Difficulty walking, Pain  Visit Diagnosis: Muscle weakness (generalized)  Other abnormalities of gait and mobility     Problem List Patient Active Problem List   Diagnosis Date Noted   Lower extremity weakness 01/27/2021   Vitamin D deficiency 04/16/2017   Elevated hemoglobin A1c 04/16/2017   GDM (gestational diabetes mellitus), class A1 02/19/2016    Jerricka Carvey, PT 03/20/2021, 8:59 AM  [place], [place], [address] Phone: [phone]   Fax:  [phone]  Name: Katelyn Bauer MRN: [id number] Date of Birth: 20-Jun-1990  Raeford Razor, PT 03/20/21 8:59 AM Phone: [phone] Fax: [phone]

## 2021-03-23 ENCOUNTER — Other Ambulatory Visit: Payer: Self-pay

## 2021-03-23 ENCOUNTER — Ambulatory Visit: Payer: Medicaid Other | Admitting: Physical Therapy

## 2021-03-23 ENCOUNTER — Encounter: Payer: Self-pay | Admitting: Physical Therapy

## 2021-03-23 DIAGNOSIS — M6281 Muscle weakness (generalized): Secondary | ICD-10-CM | POA: Diagnosis not present

## 2021-03-23 DIAGNOSIS — R2689 Other abnormalities of gait and mobility: Secondary | ICD-10-CM | POA: Diagnosis not present

## 2021-03-23 NOTE — Therapy (Signed)
[place], [place], [address] Phone: [phone]   Fax:  [phone]  Physical Therapy Treatment/Discharge  Patient Details  Name: Katelyn Bauer MRN: [id number] Date of Birth: 05-15-1990 Referring Provider (PT): Dr.Steven Sammuel Hines   Encounter Date: 03/23/2021   PT End of Session - 03/23/21 0810     Visit Number 11    Number of Visits 12    Date for PT Re-Evaluation 04/03/21    Authorization Type [employer]; authorized 12 visits initially    Authorization - Visit Number 11    Authorization - Number of Visits 12    PT Start Time 0805    PT Stop Time 0845    PT Time Calculation (min) 40 min    Activity Tolerance Patient tolerated treatment well    Behavior During Therapy [facility] for tasks assessed/performed             Past Medical History:  Diagnosis Date   Low bone density    Medical history non-contributory     Past Surgical History:  Procedure Laterality Date   NO PAST SURGERIES      There were no vitals filed for this visit.   Subjective Assessment - 03/23/21 0810     Subjective No pain today.  Patient saw Dr. B who cleared her for return to [facility].  She does have to get another DEXA to compare to original test.  Plans to go to the primary MD to get a referral. She still hasnt heard back from [facility]. OK for Dc today    Currently in Pain? No/denies                [facility] PT Assessment - 03/23/21 0001       Functional Tests   Functional tests Single leg stance      Single Leg Stance   Comments 30 sec bilaterally      AROM   Overall AROM  Within functional limits for tasks performed    Overall AROM Comments in knees, hips and ankles    Lumbar Flexion WFL no pain    Lumbar Extension WFL no pain      Strength   Right Hip Flexion 5/5    Right Hip Extension 5/5    Right Hip ABduction 5/5    Left Hip Flexion 5/5    Left Hip Extension 5/5    Left Hip ABduction  5/5    Right Knee Flexion 5/5    Right Knee Extension 5/5    Left Knee Flexion 5/5    Left Knee Extension 5/5    Right Ankle Dorsiflexion 5/5    Left Ankle Dorsiflexion 5/5      Transfers   Five time sit to stand comments  8. 5 sec no hands      Ambulation/Gait   Gait Comments 2MWT  583 feet               [facility] Adult PT Treatment/Exercise:   Therapeutic Exercise: Elliptical L 15 ramp, level 5 resist. For 5 min  Squats 15 lbs , 25 lbs x 15 each  RDL 15 lbs, 25 lbs x 15 each             PT Education - 03/23/21 0824     Education Details PT, Discharge, final session and goals    Person(s) Educated Patient    Methods Explanation    Comprehension Verbalized understanding;Returned demonstration  PT Short Term Goals - 03/01/21 0932       PT SHORT TERM GOAL #1   Title Pt will be I with initial HEP    Status Achieved      PT SHORT TERM GOAL #2   Title Pt will be able to walk consistently without crutches and a gait speed 0.8 m/s or better    Status Achieved      PT SHORT TERM GOAL #3   Title Pt will be able to complete 2 min walk test and set goal.    Status Achieved               PT Long Term Goals - 03/23/21 0831       PT LONG TERM GOAL #1   Title Pt will be able to show independence with HEP for long term strength and conditioning    Status Achieved      PT LONG TERM GOAL #2   Title Pt will be able to to walk for 30 min in the community without knee or back pain    Status Achieved      PT LONG TERM GOAL #3   Title Pt will be able to negotiate stairs 12 + in her home without the use of rails    Status Achieved      PT LONG TERM GOAL #4   Title Pt will report no pain in hips, back with standing, ADLs > 15 min    Status Achieved      PT LONG TERM GOAL #5   Title Pt will be able to walk 450 feet or more in 2 min    Status Achieved                   Plan - 03/23/21 0829     Clinical Impression Statement Patient  is now showing 5/5 strength throughout, normal trunk and knee ROM.  She shows good form with lifting and squatting.  She is DC from PT at this time.    PT Treatment/Interventions ADLs/Self Care Home Management;Neuromuscular re-education;Therapeutic activities;Functional mobility training;Balance training;Patient/family education;Therapeutic exercise;Manual techniques;Gait training;DME Instruction    PT Next Visit Plan DC    PT Home Exercise Plan Access Code: [email or web address]    Consulted and Agree with Plan of Care Patient             Patient will benefit from skilled therapeutic intervention in order to improve the following deficits and impairments:  Abnormal gait, Decreased strength, Decreased coordination, Decreased balance, Decreased mobility, Difficulty walking, Pain  Visit Diagnosis: Muscle weakness (generalized)  Other abnormalities of gait and mobility     Problem List Patient Active Problem List   Diagnosis Date Noted   Lower extremity weakness 01/27/2021   Vitamin D deficiency 04/16/2017   Elevated hemoglobin A1c 04/16/2017   GDM (gestational diabetes mellitus), class A1 02/19/2016    Adren Dollins, PT 03/23/2021, 8:45 AM  [facility] [address] [place], [place], [address] Phone: [phone]   Fax:  [phone]  Name: Katelyn Bauer MRN: [id number] Date of Birth: [birth year]   PHYSICAL THERAPY DISCHARGE SUMMARY  Visits from Start of Care: 11  Current functional level related to goals / functional outcomes: See above   Remaining deficits: None limiting function   Education / Equipment: HEP, posture, lifting, gait    Patient agrees to discharge. Patient goals were met. Patient is being discharged due to meeting the stated rehab goals.  Raeford Razor, PT 03/23/21  8:45 AM Phone: [phone] Fax: [phone]

## 2021-08-10 DIAGNOSIS — Z79899 Other long term (current) drug therapy: Secondary | ICD-10-CM | POA: Diagnosis not present

## 2021-08-10 DIAGNOSIS — M818 Other osteoporosis without current pathological fracture: Secondary | ICD-10-CM | POA: Diagnosis not present

## 2021-10-22 ENCOUNTER — Ambulatory Visit (INDEPENDENT_AMBULATORY_CARE_PROVIDER_SITE_OTHER): Payer: Medicaid Other | Admitting: Emergency Medicine

## 2021-10-22 VITALS — BP 120/77 | HR 79 | Wt 190.6 lb

## 2021-10-22 DIAGNOSIS — R350 Frequency of micturition: Secondary | ICD-10-CM

## 2021-10-22 DIAGNOSIS — R300 Dysuria: Secondary | ICD-10-CM | POA: Diagnosis not present

## 2021-10-22 LAB — POCT URINALYSIS DIPSTICK
Bilirubin, UA: NEGATIVE
Glucose, UA: NEGATIVE
Ketones, UA: NEGATIVE
Nitrite, UA: POSITIVE
Protein, UA: NEGATIVE
Spec Grav, UA: 1.015 (ref 1.010–1.025)
Urobilinogen, UA: 0.2 E.U./dL
pH, UA: 7.5 (ref 5.0–8.0)

## 2021-10-22 MED ORDER — PHENAZOPYRIDINE HCL 200 MG PO TABS: 200.0000 mg | ORAL_TABLET | Freq: Three times a day (TID) | ORAL | 1 refills | Status: DC | PRN

## 2021-10-22 MED ORDER — NITROFURANTOIN MONOHYD MACRO 100 MG PO CAPS: 100.0000 mg | ORAL_CAPSULE | Freq: Two times a day (BID) | ORAL | 0 refills | Status: DC

## 2021-10-22 NOTE — Progress Notes (Signed)
SUBJECTIVE: Katelyn Bauer is a 31 y.o. female who complains of urinary frequency, urgency and dysuria x 7 days, without flank pain, fever, chills, or abnormal vaginal discharge or bleeding.   OBJECTIVE: Appears well, in no apparent distress.  Vital signs are normal. Urine dipstick shows positive for RBC's, positive for nitrates, and positive for leukocytes.    ASSESSMENT: Dysuria  PLAN: Treatment per orders.  Call or return to clinic prn if these symptoms worsen or fail to improve as anticipated.

## 2022-02-11 ENCOUNTER — Telehealth: Payer: Self-pay | Admitting: Emergency Medicine

## 2022-02-11 NOTE — Telephone Encounter (Signed)
TC from patient with concerns of heavy periods with severe pain. This RN discussed recommendations per protocol.  Apt made to be seen in office.

## 2022-02-13 ENCOUNTER — Other Ambulatory Visit (HOSPITAL_COMMUNITY)
Admission: RE | Admit: 2022-02-13 | Discharge: 2022-02-13 | Disposition: A | Discharge status: Home / Self Care | Payer: Medicaid Other | Source: Ambulatory Visit | Attending: Obstetrics | Admitting: Obstetrics

## 2022-02-13 ENCOUNTER — Encounter: Payer: Self-pay | Admitting: Obstetrics

## 2022-02-13 ENCOUNTER — Ambulatory Visit (INDEPENDENT_AMBULATORY_CARE_PROVIDER_SITE_OTHER): Payer: Medicaid Other | Admitting: Obstetrics

## 2022-02-13 VITALS — BP 110/66 | HR 74 | Ht 65.0 in | Wt 176.5 lb

## 2022-02-13 DIAGNOSIS — R102 Pelvic and perineal pain: Secondary | ICD-10-CM

## 2022-02-13 DIAGNOSIS — R350 Frequency of micturition: Secondary | ICD-10-CM | POA: Diagnosis not present

## 2022-02-13 DIAGNOSIS — N939 Abnormal uterine and vaginal bleeding, unspecified: Secondary | ICD-10-CM | POA: Diagnosis not present

## 2022-02-13 NOTE — Addendum Note (Signed)
Addended by: Jearld Adjutant on: 02/13/2022 01:51 PM   Modules accepted: Orders

## 2022-02-13 NOTE — Progress Notes (Signed)
Pt presents for AUB bleeding. Pt is on period now but states her period in October was normal but she had 2 period that month. This is unusual for her. Period this month was very painful and contains bright red blood on day 4 which she states is not normal. Home UPT was negative 3 weeks ago. Pt stopped Depo July 2022

## 2022-02-13 NOTE — Progress Notes (Signed)
Patient ID: Katelyn Bauer, female   DOB: November 26, 1990, 31 y.o.   MRN: [id number]  No chief complaint on file.   HPI Jesyka Slaght is a 31 y.o. female.  Complains of having 2 periods this past month that was heavier and more painful.  Also c/o urinary frequency. HPI  Past Medical History:  Diagnosis Date   Low bone density    Medical history non-contributory     Past Surgical History:  Procedure Laterality Date   NO PAST SURGERIES      Family History  Problem Relation Age of Onset   Lung cancer Paternal Grandmother        2nd hand smoking    Brain cancer Paternal Grandmother     Social History Social History   Tobacco Use   Smoking status: Former   Smokeless tobacco: Never  Substance Use Topics   Alcohol use: No    Alcohol/week: 0.0 standard drinks of alcohol    Comment: Occasional   Drug use: No    Allergies  Allergen Reactions   Penicillins Swelling and Other (See Comments)    Reaction:  All over body swelling  Has patient had a PCN reaction causing immediate rash, facial/tongue/throat swelling, SOB or lightheadedness with hypotension: Yes Has patient had a PCN reaction causing severe rash involving mucus membranes or skin necrosis: No Has patient had a PCN reaction that required hospitalization No Has patient had a PCN reaction occurring within the last 10 years: No If all of the above answers are "NO", then may proceed with Cephalosporin use.      Current Outpatient Medications  Medication Sig Dispense Refill   CALCIUM PO Take 1 tablet by mouth daily.     ibuprofen (ADVIL) 800 MG tablet Take 800 mg by mouth every 8 (eight) hours as needed for mild pain. (Patient not taking: Reported on 02/06/2021)     nitrofurantoin, macrocrystal-monohydrate, (MACROBID) 100 MG capsule Take 1 capsule (100 mg total) by mouth 2 (two) times daily. 14 capsule 0   phenazopyridine (PYRIDIUM) 200 MG tablet Take 1 tablet (200 mg total) by mouth 3 (three) times daily as needed for pain  (urethral spasm). 10 tablet 1   VITAMIN D PO Take 1 tablet by mouth daily.     No current facility-administered medications for this visit.    Review of Systems Review of Systems Constitutional: negative for fatigue and weight loss Respiratory: negative for cough and wheezing Cardiovascular: negative for chest pain, fatigue and palpitations Gastrointestinal: negative for abdominal pain and change in bowel habits Genitourinary:positive for AUB and urinary frequency Integument/breast: negative for nipple discharge Musculoskeletal:negative for myalgias Neurological: negative for gait problems and tremors Behavioral/Psych: negative for abusive relationship, depression Endocrine: negative for temperature intolerance      Blood pressure 110/66, pulse 74, height 5\' 5"  (1.651 m), weight 176 lb 8 oz (80.1 kg), last menstrual period 02/09/2022.  Physical Exam Physical Exam General:   Alert and no distress  Skin:   no rash or abnormalities  Lungs:   clear to auscultation bilaterally  Heart:   regular rate and rhythm, S1, S2 normal, no murmur, click, rub or gallop  Breasts:   Not examined  Abdomen:  normal findings: no organomegaly, soft, non-tender and no hernia  Pelvis:  External genitalia: normal general appearance Urinary system: urethral meatus normal and bladder without fullness, nontender Vaginal: normal without tenderness, induration or masses Cervix: normal appearance Adnexa: normal bimanual exam Uterus: anteverted and non-tender, normal size    I have spent  a total of 20 minutes of face-to-face time, excluding clinical staff time, reviewing notes and preparing to see patient, ordering tests and/or medications, and counseling the patient.   Data Reviewed Labs  Assessment     1. Abnormal uterine bleeding (AUB) Rx: - US PELVIC COMPLETE WITH TRANSVAGINAL; Future  2. Pelvic pain Rx: - US PELVIC COMPLETE WITH TRANSVAGINAL; Future  3. Urinary frequency Rx: - Urine Culture      Plan   Follow up in 2 weeks  Orders Placed This Encounter  Procedures   Urine Culture   US PELVIC COMPLETE WITH TRANSVAGINAL    Standing Status:   Future    Standing Expiration Date:   02/14/2023    Order Specific Question:   Reason for Exam (SYMPTOM  OR DIAGNOSIS REQUIRED)    Answer:   Pelvic pain    Order Specific Question:   Preferred imaging location?    Answer:   [facility]       Brock Bad, MD 02/13/2022 1:18 PM

## 2022-02-18 ENCOUNTER — Telehealth: Payer: Self-pay | Admitting: Emergency Medicine

## 2022-02-18 ENCOUNTER — Other Ambulatory Visit: Payer: Self-pay | Admitting: Obstetrics

## 2022-02-18 DIAGNOSIS — R300 Dysuria: Secondary | ICD-10-CM

## 2022-02-18 LAB — URINE CULTURE

## 2022-02-18 MED ORDER — NITROFURANTOIN MONOHYD MACRO 100 MG PO CAPS: 100.0000 mg | ORAL_CAPSULE | Freq: Two times a day (BID) | ORAL | 0 refills | Status: DC

## 2022-02-18 NOTE — Telephone Encounter (Signed)
Attempted TC to patient to discuss results, Rx. LVM 

## 2022-02-18 NOTE — Telephone Encounter (Signed)
-----   Message from Brock Bad, MD sent at 02/18/2022 10:27 AM EST ----- Macrobid Rx for UTI

## 2022-02-20 ENCOUNTER — Encounter: Payer: Self-pay | Admitting: Emergency Medicine

## 2022-02-21 ENCOUNTER — Ambulatory Visit (HOSPITAL_BASED_OUTPATIENT_CLINIC_OR_DEPARTMENT_OTHER)
Admission: RE | Admit: 2022-02-21 | Discharge: 2022-02-21 | Disposition: A | Discharge status: Home / Self Care | Payer: Medicaid Other | Source: Ambulatory Visit | Attending: Obstetrics | Admitting: Obstetrics

## 2022-02-21 DIAGNOSIS — R102 Pelvic and perineal pain: Secondary | ICD-10-CM

## 2022-02-21 DIAGNOSIS — N939 Abnormal uterine and vaginal bleeding, unspecified: Secondary | ICD-10-CM

## 2022-02-25 LAB — CERVICOVAGINAL ANCILLARY ONLY
Bacterial Vaginitis (gardnerella): POSITIVE — AB
Candida Glabrata: NEGATIVE
Candida Vaginitis: NEGATIVE
Chlamydia: NEGATIVE
Comment: NEGATIVE
Comment: NEGATIVE
Comment: NEGATIVE
Comment: NEGATIVE
Comment: NEGATIVE
Comment: NORMAL
Neisseria Gonorrhea: NEGATIVE
Trichomonas: NEGATIVE

## 2022-02-26 ENCOUNTER — Other Ambulatory Visit: Payer: Self-pay | Admitting: Obstetrics

## 2022-02-26 DIAGNOSIS — B9689 Other specified bacterial agents as the cause of diseases classified elsewhere: Secondary | ICD-10-CM

## 2022-02-26 MED ORDER — METRONIDAZOLE 500 MG PO TABS: 500.0000 mg | ORAL_TABLET | Freq: Two times a day (BID) | ORAL | 2 refills | Status: DC

## 2022-02-26 NOTE — Progress Notes (Signed)
TC. Advised pt of BV and RX sent. Pt verbalized understanding.Education sent via MyChart.

## 2022-04-01 ENCOUNTER — Encounter: Payer: Self-pay | Admitting: Student

## 2022-04-01 ENCOUNTER — Other Ambulatory Visit (HOSPITAL_COMMUNITY)
Admission: RE | Admit: 2022-04-01 | Discharge: 2022-04-01 | Disposition: A | Discharge status: Home / Self Care | Payer: Medicaid Other | Source: Ambulatory Visit | Attending: Advanced Practice Midwife | Admitting: Advanced Practice Midwife

## 2022-04-01 ENCOUNTER — Ambulatory Visit (INDEPENDENT_AMBULATORY_CARE_PROVIDER_SITE_OTHER): Payer: Medicaid Other | Admitting: Student

## 2022-04-01 VITALS — BP 108/79 | HR 82 | Ht 65.0 in | Wt 178.6 lb

## 2022-04-01 DIAGNOSIS — N939 Abnormal uterine and vaginal bleeding, unspecified: Secondary | ICD-10-CM

## 2022-04-01 DIAGNOSIS — Z01419 Encounter for gynecological examination (general) (routine) without abnormal findings: Secondary | ICD-10-CM | POA: Insufficient documentation

## 2022-04-01 LAB — POCT URINE PREGNANCY: Preg Test, Ur: NEGATIVE

## 2022-04-01 NOTE — Progress Notes (Signed)
Pt presents for annual reports heavy cycles. She is changing pads and tampons every hour.  Requests all STD testing.  Negative pap 09/04/2020 UPT neg

## 2022-04-01 NOTE — Progress Notes (Signed)
ANNUAL EXAM Patient name: Katelyn Bauer MRN [id number]  Date of birth: 09-Dec-1990 Chief Complaint:   Gynecologic Exam  History of Present Illness:   Stanley Helmuth is a 32 y.o. G21P2002 African-American female being seen today for a routine annual exam.  Current complaints: Irregular and heavy periods. Patient using tampons and pads and changing Q1-2 hours. Describes bleeding as mixture of bright red blood and dark, brown color.  Reports cramping and breast tenderness after cycle has completed. Using heat pads for comfort. On occasion will have bleeding in between expected cycles.   Patient's last menstrual period was 03/09/2022.   Upstream - 04/01/22 0820       Pregnancy Intention Screening   Does the patient want to become pregnant in the next year? Yes    Does the patient's partner want to become pregnant in the next year? N/A    Would the patient like to discuss contraceptive options today? No      Contraception Wrap Up   Current Method No Method - Other Reason    Reason for No Current Contraceptive Method at Intake ([facility] Only) Seeking Pregnancy            The pregnancy intention screening data noted above was reviewed. Potential methods of contraception were discussed. The patient elected to proceed with No data recorded.   Last pap 2022. Results were: NILM w/ HRHPV not done. H/O abnormal pap: no Last mammogram: n/a. Results were: N/A. Family h/o breast cancer: no Last colonoscopy: n/a. Results were: N/A. Family h/o colorectal cancer: no     04/01/2022    8:18 AM  Depression screen PHQ 2/9  Decreased Interest 0  Down, Depressed, Hopeless 0  PHQ - 2 Score 0  Altered sleeping 0  Tired, decreased energy 0  Change in appetite 0  Feeling bad or failure about yourself  0  Trouble concentrating 0  Moving slowly or fidgety/restless 0  Suicidal thoughts 0  PHQ-9 Score 0  Difficult doing work/chores Not difficult at all        04/01/2022    8:19 AM 06/17/2019    3:31 PM   GAD 7 : Generalized Anxiety Score  Nervous, Anxious, on Edge 0 0  Control/stop worrying 0 0  Worry too much - different things 0 0  Trouble relaxing 0 0  Restless 0 0  Easily annoyed or irritable 0 0  Afraid - awful might happen 0 0  Total GAD 7 Score 0 0  Anxiety Difficulty Not difficult at all Not difficult at all     Review of Systems:   Pertinent items are noted in HPI Denies any headaches, blurred vision, fatigue, shortness of breath, chest pain, abdominal pain, abnormal vaginal discharge/itching/odor/irritation, problems with periods, bowel movements, urination, or intercourse unless otherwise stated above. Pertinent History Reviewed:  Reviewed past medical,surgical, social and family history.  Reviewed problem list, medications and allergies. Physical Assessment:   Vitals:   04/01/22 0814  BP: 108/79  Pulse: 82  Weight: 178 lb 9.6 oz (81 kg)  Height: 5\' 5"  (1.651 m)  Body mass index is 29.72 kg/m.        Physical Examination:   General appearance - well appearing, and in no distress  Mental status - alert, oriented to person, place, and time  Psych:  She has a normal mood and affect  Skin - warm and dry, normal color, no suspicious lesions noted  Chest - effort normal, all lung fields clear to auscultation bilaterally  Heart -  normal rate and regular rhythm  Neck:  midline trachea, no thyromegaly or nodules  Breasts - breasts appear normal, no suspicious masses, no skin or nipple changes or  axillary nodes  Abdomen - soft, nontender, nondistended, no masses or organomegaly  Pelvic - VULVA: normal appearing vulva with no masses, tenderness or lesions  VAGINA: normal appearing vagina with normal color and discharge, no lesions Thin prep pap is not done    Extremities:  No swelling or varicosities noted  Chaperone present for exam  No results found for this or any previous visit (from the past 24 hour(s)).  Assessment & Plan:  1. Women's annual routine  gynecological examination - Discussed recommended screening schedule and encouraged to be seen annually  - Cervicovaginal ancillary only( [place]) - Hepatitis B surface antigen - Hepatitis C antibody - RPR - HIV Antibody (routine testing w rflx) - TSH Rfx on Abnormal to Free T4 - Prolactin - HgB A1c - Testosterone, Free, Total, SHBG - Estradiol - Follicle stimulating hormone - Von Willebrand panel - CBC - Beta hCG quant (ref lab) - Comprehensive metabolic panel  2. Abnormal uterine bleeding (AUB) - Discussed different comfort and pharmacological methods for management. Patient does not desire hormonal therapy at this time. Counseled on the benefits of lifestyle modifications to support regular cycles such as sleep hygiene, mild exercise, diet modifications, and low stress environments. Offered patient to trial beet juice or red raspberry leaf tea for symptom management. Discussed using NSAID at the time of cycle starting to aid in symptom management. Follow-up Pelvic Ultrasound ordered (last  was in 2022) - Cervicovaginal ancillary only( [place]) - TSH Rfx on Abnormal to Free T4 - Prolactin - HgB A1c - Testosterone, Free, Total, SHBG - Estradiol - Follicle stimulating hormone - Von Willebrand panel - CBC - Beta hCG quant (ref lab)  Labs/procedures today:   Mammogram: @ 31yo, or sooner if problems Colonoscopy: @ 31yo, or sooner if problems  Orders Placed This Encounter  Procedures   Hepatitis B surface antigen   Hepatitis C antibody   RPR   HIV Antibody (routine testing w rflx)   TSH Rfx on Abnormal to Free T4   Prolactin   HgB A1c   Testosterone, Free, Total, SHBG   Estradiol   Follicle stimulating hormone   Von Willebrand panel   CBC   Beta hCG quant (ref lab)   Comprehensive metabolic panel    Meds: No orders of the defined types were placed in this encounter.   Follow-up: Return in about 3 months (around 07/01/2022), or if symptoms worsen or fail to  improve, for IN-PERSON, VIRTUAL.  Corlis Hove, NP 04/01/2022 9:00 AM

## 2022-04-02 ENCOUNTER — Inpatient Hospital Stay: Admission: RE | Admit: 2022-04-02 | Payer: Medicaid Other | Source: Ambulatory Visit

## 2022-04-02 LAB — CERVICOVAGINAL ANCILLARY ONLY
Bacterial Vaginitis (gardnerella): POSITIVE — AB
Candida Glabrata: NEGATIVE
Candida Vaginitis: NEGATIVE
Chlamydia: NEGATIVE
Comment: NEGATIVE
Comment: NEGATIVE
Comment: NEGATIVE
Comment: NEGATIVE
Comment: NEGATIVE
Comment: NORMAL
Neisseria Gonorrhea: NEGATIVE
Trichomonas: NEGATIVE

## 2022-04-02 LAB — TSH RFX ON ABNORMAL TO FREE T4: TSH: 1.57 u[IU]/mL (ref 0.450–4.500)

## 2022-04-03 ENCOUNTER — Other Ambulatory Visit: Payer: Medicaid Other

## 2022-04-04 ENCOUNTER — Other Ambulatory Visit: Payer: Self-pay | Admitting: Student

## 2022-04-04 DIAGNOSIS — N760 Acute vaginitis: Secondary | ICD-10-CM

## 2022-04-04 MED ORDER — METRONIDAZOLE 500 MG PO TABS: 500.0000 mg | ORAL_TABLET | Freq: Two times a day (BID) | ORAL | 0 refills | Status: AC

## 2022-04-08 LAB — COMPREHENSIVE METABOLIC PANEL
ALT: 8 IU/L (ref 0–32)
AST: 14 IU/L (ref 0–40)
Albumin/Globulin Ratio: 1.3 (ref 1.2–2.2)
Albumin: 4.3 g/dL (ref 4.0–5.0)
Alkaline Phosphatase: 47 IU/L (ref 44–121)
BUN/Creatinine Ratio: 14 (ref 9–23)
BUN: 9 mg/dL (ref 6–20)
Bilirubin Total: 0.3 mg/dL (ref 0.0–1.2)
CO2: 20 mmol/L (ref 20–29)
Calcium: 9.5 mg/dL (ref 8.7–10.2)
Chloride: 103 mmol/L (ref 96–106)
Creatinine, Ser: 0.66 mg/dL (ref 0.57–1.00)
Globulin, Total: 3.2 g/dL (ref 1.5–4.5)
Glucose: 84 mg/dL (ref 70–99)
Potassium: 4.3 mmol/L (ref 3.5–5.2)
Sodium: 138 mmol/L (ref 134–144)
Total Protein: 7.5 g/dL (ref 6.0–8.5)
eGFR: 121 mL/min/{1.73_m2} (ref >59)

## 2022-04-08 LAB — RPR: RPR Ser Ql: NONREACTIVE

## 2022-04-08 LAB — CBC
Hematocrit: 36.4 % (ref 34.0–46.6)
Hemoglobin: 11.5 g/dL (ref 11.1–15.9)
MCH: 22 pg — ABNORMAL LOW (ref 26.6–33.0)
MCHC: 31.6 g/dL (ref 31.5–35.7)
MCV: 70 fL — ABNORMAL LOW (ref 79–97)
Platelets: 299 10*3/uL (ref 150–450)
RBC: 5.22 x10E6/uL (ref 3.77–5.28)
RDW: 15.8 % — ABNORMAL HIGH (ref 11.7–15.4)
WBC: 5.2 10*3/uL (ref 3.4–10.8)

## 2022-04-08 LAB — HEPATITIS B SURFACE ANTIGEN: Hepatitis B Surface Ag: NEGATIVE

## 2022-04-08 LAB — VON WILLEBRAND PANEL
Factor VIII Activity: 90 % (ref 56–140)
Von Willebrand Ag: 141 % (ref 50–200)
Von Willebrand Factor: 97 % (ref 50–200)

## 2022-04-08 LAB — PROLACTIN: Prolactin: 11.6 ng/mL (ref 4.8–33.4)

## 2022-04-08 LAB — FOLLICLE STIMULATING HORMONE: FSH: 1.8 m[IU]/mL

## 2022-04-08 LAB — HIV ANTIBODY (ROUTINE TESTING W REFLEX): HIV Screen 4th Generation wRfx: NONREACTIVE

## 2022-04-08 LAB — TESTOSTERONE, FREE, TOTAL, SHBG
Sex Hormone Binding: 37.9 nmol/L (ref 24.6–122.0)
Testosterone, Free: 1 pg/mL (ref 0.0–4.2)
Testosterone: 15 ng/dL (ref 13–71)

## 2022-04-08 LAB — HEPATITIS C ANTIBODY: Hep C Virus Ab: NONREACTIVE

## 2022-04-08 LAB — BETA HCG QUANT (REF LAB): hCG Quant: <1 m[IU]/mL

## 2022-04-08 LAB — HEMOGLOBIN A1C
Est. average glucose Bld gHb Est-mCnc: 105 mg/dL
Hgb A1c MFr Bld: 5.3 % (ref 4.8–5.6)

## 2022-04-08 LAB — ESTRADIOL: Estradiol: 134 pg/mL

## 2022-04-08 LAB — COAG STUDIES INTERP REPORT

## 2022-04-11 ENCOUNTER — Other Ambulatory Visit: Payer: Medicaid Other

## 2022-07-01 ENCOUNTER — Ambulatory Visit (INDEPENDENT_AMBULATORY_CARE_PROVIDER_SITE_OTHER): Payer: Medicaid Other | Admitting: *Deleted

## 2022-07-01 ENCOUNTER — Ambulatory Visit: Payer: Medicaid Other

## 2022-07-01 VITALS — BP 133/79 | HR 83

## 2022-07-01 DIAGNOSIS — Z3202 Encounter for pregnancy test, result negative: Secondary | ICD-10-CM

## 2022-07-01 DIAGNOSIS — N923 Ovulation bleeding: Secondary | ICD-10-CM

## 2022-07-01 LAB — POCT URINE PREGNANCY: Preg Test, Ur: NEGATIVE

## 2022-07-01 NOTE — Progress Notes (Signed)
Katelyn Bauer here for a UPT. Pt had a negative upt at home. Pt had some spotting last but says it was later than her when her cycle would happen. Marland Kitchen     UPT in office Negative.    Told pt she can do a UPT weekly as it may still be early, and if she has any more issues or concerns she can reach back out.   Scheryl Marten, RN

## 2022-11-19 DIAGNOSIS — R221 Localized swelling, mass and lump, neck: Secondary | ICD-10-CM | POA: Diagnosis not present

## 2022-11-19 DIAGNOSIS — Z3202 Encounter for pregnancy test, result negative: Secondary | ICD-10-CM | POA: Diagnosis not present

## 2023-06-11 DIAGNOSIS — R1084 Generalized abdominal pain: Secondary | ICD-10-CM | POA: Diagnosis not present

## 2023-06-11 DIAGNOSIS — Z3202 Encounter for pregnancy test, result negative: Secondary | ICD-10-CM | POA: Diagnosis not present

## 2023-06-11 DIAGNOSIS — R5383 Other fatigue: Secondary | ICD-10-CM | POA: Diagnosis not present

## 2023-07-18 DIAGNOSIS — L308 Other specified dermatitis: Secondary | ICD-10-CM | POA: Diagnosis not present

## 2023-07-18 DIAGNOSIS — L989 Disorder of the skin and subcutaneous tissue, unspecified: Secondary | ICD-10-CM | POA: Diagnosis not present

## 2023-07-18 DIAGNOSIS — B028 Zoster with other complications: Secondary | ICD-10-CM | POA: Diagnosis not present

## 2023-08-10 DIAGNOSIS — H5213 Myopia, bilateral: Secondary | ICD-10-CM | POA: Diagnosis not present
